# Patient Record
Sex: Male | Born: 1945 | Race: White | Hispanic: No | Marital: Single | Smoking: Never smoker
Health system: Southern US, Community
[De-identification: ages and names within clinical notes are randomized; demographics above are authoritative.]

## PROBLEM LIST (undated history)

## (undated) DIAGNOSIS — N182 Chronic kidney disease, stage 2 (mild): Secondary | ICD-10-CM

## (undated) DIAGNOSIS — G20C Parkinsonism, unspecified: Secondary | ICD-10-CM

## (undated) DIAGNOSIS — D61818 Other pancytopenia: Secondary | ICD-10-CM

## (undated) DIAGNOSIS — E039 Hypothyroidism, unspecified: Secondary | ICD-10-CM

## (undated) DIAGNOSIS — Z86010 Personal history of colonic polyps: Secondary | ICD-10-CM

## (undated) DIAGNOSIS — R2689 Other abnormalities of gait and mobility: Secondary | ICD-10-CM

## (undated) DIAGNOSIS — I10 Essential (primary) hypertension: Secondary | ICD-10-CM

## (undated) DIAGNOSIS — M1990 Unspecified osteoarthritis, unspecified site: Secondary | ICD-10-CM

## (undated) DIAGNOSIS — Z87442 Personal history of urinary calculi: Secondary | ICD-10-CM

## (undated) DIAGNOSIS — D649 Anemia, unspecified: Secondary | ICD-10-CM

## (undated) DIAGNOSIS — Z973 Presence of spectacles and contact lenses: Secondary | ICD-10-CM

## (undated) DIAGNOSIS — Z860101 Personal history of adenomatous and serrated colon polyps: Secondary | ICD-10-CM

## (undated) DIAGNOSIS — E785 Hyperlipidemia, unspecified: Secondary | ICD-10-CM

## (undated) DIAGNOSIS — Z8673 Personal history of transient ischemic attack (TIA), and cerebral infarction without residual deficits: Secondary | ICD-10-CM

## (undated) DIAGNOSIS — H548 Legal blindness, as defined in USA: Secondary | ICD-10-CM

## (undated) DIAGNOSIS — Q159 Congenital malformation of eye, unspecified: Secondary | ICD-10-CM

## (undated) DIAGNOSIS — R161 Splenomegaly, not elsewhere classified: Secondary | ICD-10-CM

## (undated) DIAGNOSIS — D4959 Neoplasm of unspecified behavior of other genitourinary organ: Secondary | ICD-10-CM

## (undated) DIAGNOSIS — Z972 Presence of dental prosthetic device (complete) (partial): Secondary | ICD-10-CM

## (undated) DIAGNOSIS — G20 Parkinson's disease: Secondary | ICD-10-CM

## (undated) DIAGNOSIS — Z974 Presence of external hearing-aid: Secondary | ICD-10-CM

## (undated) HISTORY — PX: INGUINAL HERNIA REPAIR: SUR1180

## (undated) HISTORY — PX: COLONOSCOPY: SHX174

## (undated) HISTORY — DX: Hyperlipidemia, unspecified: E78.5

---

## 1985-12-04 HISTORY — PX: CYSTOSCOPY/RETROGRADE/URETEROSCOPY/STONE EXTRACTION WITH BASKET: SHX5317

## 1999-02-02 ENCOUNTER — Ambulatory Visit (HOSPITAL_COMMUNITY): Admission: RE | Admit: 1999-02-02 | Discharge: 1999-02-02 | Payer: Self-pay

## 2001-10-06 ENCOUNTER — Emergency Department (HOSPITAL_COMMUNITY): Admission: EM | Admit: 2001-10-06 | Discharge: 2001-10-06 | Payer: Self-pay | Admitting: Emergency Medicine

## 2001-12-30 ENCOUNTER — Encounter (INDEPENDENT_AMBULATORY_CARE_PROVIDER_SITE_OTHER): Payer: Self-pay | Admitting: Specialist

## 2001-12-30 ENCOUNTER — Ambulatory Visit (HOSPITAL_COMMUNITY): Admission: RE | Admit: 2001-12-30 | Discharge: 2001-12-30 | Payer: Self-pay | Admitting: *Deleted

## 2002-01-09 ENCOUNTER — Ambulatory Visit (HOSPITAL_COMMUNITY): Admission: RE | Admit: 2002-01-09 | Discharge: 2002-01-09 | Payer: Self-pay | Admitting: Oncology

## 2002-01-09 ENCOUNTER — Encounter (HOSPITAL_COMMUNITY): Payer: Self-pay | Admitting: Oncology

## 2005-03-04 ENCOUNTER — Emergency Department (HOSPITAL_COMMUNITY): Admission: EM | Admit: 2005-03-04 | Discharge: 2005-03-04 | Payer: Self-pay | Admitting: Family Medicine

## 2005-10-30 ENCOUNTER — Encounter: Admission: RE | Admit: 2005-10-30 | Discharge: 2005-10-30 | Payer: Self-pay | Admitting: Obstetrics and Gynecology

## 2005-11-06 ENCOUNTER — Ambulatory Visit (HOSPITAL_COMMUNITY): Admission: RE | Admit: 2005-11-06 | Discharge: 2005-11-06 | Payer: Self-pay | Admitting: Surgery

## 2005-11-06 ENCOUNTER — Ambulatory Visit (HOSPITAL_BASED_OUTPATIENT_CLINIC_OR_DEPARTMENT_OTHER): Admission: RE | Admit: 2005-11-06 | Discharge: 2005-11-06 | Payer: Self-pay | Admitting: Surgery

## 2005-11-06 ENCOUNTER — Encounter (INDEPENDENT_AMBULATORY_CARE_PROVIDER_SITE_OTHER): Payer: Self-pay | Admitting: *Deleted

## 2005-12-06 ENCOUNTER — Emergency Department (HOSPITAL_COMMUNITY): Admission: EM | Admit: 2005-12-06 | Discharge: 2005-12-07 | Payer: Self-pay | Admitting: Emergency Medicine

## 2007-07-01 ENCOUNTER — Ambulatory Visit: Payer: Self-pay | Admitting: Oncology

## 2007-08-07 LAB — COMPREHENSIVE METABOLIC PANEL
ALT: 18 U/L (ref 0–53)
AST: 27 U/L (ref 0–37)
Albumin: 4.9 g/dL (ref 3.5–5.2)
Alkaline Phosphatase: 42 U/L (ref 39–117)
Calcium: 9.2 mg/dL (ref 8.4–10.5)
Chloride: 105 mEq/L (ref 96–112)
Potassium: 3.8 mEq/L (ref 3.5–5.3)
Sodium: 138 mEq/L (ref 135–145)
Total Protein: 7 g/dL (ref 6.0–8.3)

## 2007-08-07 LAB — CBC & DIFF AND RETIC
BASO%: 0.4 % (ref 0.0–2.0)
LYMPH%: 28.8 % (ref 14.0–48.0)
MCHC: 35.8 g/dL (ref 32.0–35.9)
MCV: 86.3 fL (ref 81.6–98.0)
MONO#: 0.5 10*3/uL (ref 0.1–0.9)
MONO%: 15.6 % — ABNORMAL HIGH (ref 0.0–13.0)
NEUT#: 1.6 10*3/uL (ref 1.5–6.5)
Platelets: 110 10*3/uL — ABNORMAL LOW (ref 145–400)
RBC: 3.93 10*6/uL — ABNORMAL LOW (ref 4.20–5.71)
RDW: 13.8 % (ref 11.2–14.6)
RETIC #: 90.4 10*3/uL (ref 31.8–103.9)
Retic %: 2.3 % (ref 0.7–2.3)
WBC: 3 10*3/uL — ABNORMAL LOW (ref 4.0–10.0)

## 2007-08-07 LAB — CHCC SMEAR

## 2007-08-07 LAB — MORPHOLOGY: PLT EST: DECREASED

## 2007-10-30 ENCOUNTER — Ambulatory Visit: Payer: Self-pay | Admitting: Oncology

## 2007-11-05 LAB — MORPHOLOGY

## 2007-11-05 LAB — CBC WITH DIFFERENTIAL/PLATELET
EOS%: 3.2 % (ref 0.0–7.0)
Eosinophils Absolute: 0.1 10*3/uL (ref 0.0–0.5)
LYMPH%: 25.3 % (ref 14.0–48.0)
MCH: 31 pg (ref 28.0–33.4)
MCHC: 35.8 g/dL (ref 32.0–35.9)
MCV: 86.6 fL (ref 81.6–98.0)
MONO%: 19.8 % — ABNORMAL HIGH (ref 0.0–13.0)
Platelets: 99 10*3/uL — ABNORMAL LOW (ref 145–400)
RBC: 3.96 10*6/uL — ABNORMAL LOW (ref 4.20–5.71)

## 2007-11-05 LAB — RETICULOCYTES
IRF: 0.43 — ABNORMAL HIGH (ref 0.070–0.380)
RETIC #: 66.3 10*3/uL (ref 31.8–103.9)

## 2007-11-06 LAB — COMPREHENSIVE METABOLIC PANEL
ALT: 17 U/L (ref 0–53)
AST: 31 U/L (ref 0–37)
Calcium: 10 mg/dL (ref 8.4–10.5)
Chloride: 105 mEq/L (ref 96–112)
Creatinine, Ser: 1.6 mg/dL — ABNORMAL HIGH (ref 0.40–1.50)
Sodium: 141 mEq/L (ref 135–145)
Total Bilirubin: 0.5 mg/dL (ref 0.3–1.2)

## 2008-01-10 ENCOUNTER — Ambulatory Visit (HOSPITAL_COMMUNITY): Admission: RE | Admit: 2008-01-10 | Discharge: 2008-01-10 | Payer: Self-pay | Admitting: Internal Medicine

## 2008-03-25 ENCOUNTER — Encounter: Admission: RE | Admit: 2008-03-25 | Discharge: 2008-04-13 | Payer: Self-pay | Admitting: Orthopedic Surgery

## 2008-04-02 ENCOUNTER — Ambulatory Visit (HOSPITAL_COMMUNITY): Admission: RE | Admit: 2008-04-02 | Discharge: 2008-04-02 | Payer: Self-pay | Admitting: Orthopedic Surgery

## 2008-05-06 ENCOUNTER — Ambulatory Visit: Payer: Self-pay | Admitting: Oncology

## 2008-05-08 LAB — COMPREHENSIVE METABOLIC PANEL
Albumin: 4.9 g/dL (ref 3.5–5.2)
Alkaline Phosphatase: 39 U/L (ref 39–117)
BUN: 22 mg/dL (ref 6–23)
Calcium: 9.6 mg/dL (ref 8.4–10.5)
Chloride: 103 mEq/L (ref 96–112)
Glucose, Bld: 93 mg/dL (ref 70–99)
Potassium: 4.6 mEq/L (ref 3.5–5.3)
Sodium: 137 mEq/L (ref 135–145)
Total Protein: 7.1 g/dL (ref 6.0–8.3)

## 2008-05-08 LAB — CBC WITH DIFFERENTIAL/PLATELET
Basophils Absolute: 0 10*3/uL (ref 0.0–0.1)
Eosinophils Absolute: 0.1 10*3/uL (ref 0.0–0.5)
HGB: 11.9 g/dL — ABNORMAL LOW (ref 13.0–17.1)
MONO#: 0.5 10*3/uL (ref 0.1–0.9)
MONO%: 15.6 % — ABNORMAL HIGH (ref 0.0–13.0)
NEUT#: 1.5 10*3/uL (ref 1.5–6.5)
RBC: 3.92 10*6/uL — ABNORMAL LOW (ref 4.20–5.71)
RDW: 13.2 % (ref 11.2–14.6)
WBC: 2.9 10*3/uL — ABNORMAL LOW (ref 4.0–10.0)
lymph#: 0.9 10*3/uL (ref 0.9–3.3)

## 2008-06-04 ENCOUNTER — Ambulatory Visit (HOSPITAL_COMMUNITY): Admission: RE | Admit: 2008-06-04 | Discharge: 2008-06-05 | Payer: Self-pay | Admitting: Neurosurgery

## 2008-06-04 HISTORY — PX: ANTERIOR CERVICAL DECOMP/DISCECTOMY FUSION: SHX1161

## 2008-11-04 ENCOUNTER — Ambulatory Visit: Payer: Self-pay | Admitting: Oncology

## 2008-11-06 LAB — CBC WITH DIFFERENTIAL/PLATELET
Basophils Absolute: 0 10*3/uL (ref 0.0–0.1)
EOS%: 3.3 % (ref 0.0–7.0)
Eosinophils Absolute: 0.1 10*3/uL (ref 0.0–0.5)
HCT: 34.6 % — ABNORMAL LOW (ref 38.7–49.9)
HGB: 12.2 g/dL — ABNORMAL LOW (ref 13.0–17.1)
MCH: 30.8 pg (ref 28.0–33.4)
MCV: 87.3 fL (ref 81.6–98.0)
NEUT#: 1.9 10*3/uL (ref 1.5–6.5)
NEUT%: 51.1 % (ref 40.0–75.0)
lymph#: 1.1 10*3/uL (ref 0.9–3.3)

## 2008-11-06 LAB — COMPREHENSIVE METABOLIC PANEL
AST: 25 U/L (ref 0–37)
Albumin: 4.8 g/dL (ref 3.5–5.2)
BUN: 18 mg/dL (ref 6–23)
Calcium: 9.4 mg/dL (ref 8.4–10.5)
Chloride: 106 mEq/L (ref 96–112)
Creatinine, Ser: 1.55 mg/dL — ABNORMAL HIGH (ref 0.40–1.50)
Glucose, Bld: 102 mg/dL — ABNORMAL HIGH (ref 70–99)
Potassium: 4.5 mEq/L (ref 3.5–5.3)

## 2008-11-06 LAB — LACTATE DEHYDROGENASE: LDH: 346 U/L — ABNORMAL HIGH (ref 94–250)

## 2009-11-03 ENCOUNTER — Ambulatory Visit: Payer: Self-pay | Admitting: Oncology

## 2009-11-05 LAB — CBC WITH DIFFERENTIAL/PLATELET
BASO%: 0.3 % (ref 0.0–2.0)
EOS%: 2.9 % (ref 0.0–7.0)
HCT: 37.3 % — ABNORMAL LOW (ref 38.4–49.9)
LYMPH%: 28.4 % (ref 14.0–49.0)
MCH: 29.5 pg (ref 27.2–33.4)
MCHC: 34 g/dL (ref 32.0–36.0)
MCV: 86.7 fL (ref 79.3–98.0)
NEUT%: 51.7 % (ref 39.0–75.0)
Platelets: 91 10*3/uL — ABNORMAL LOW (ref 140–400)

## 2009-11-05 LAB — COMPREHENSIVE METABOLIC PANEL
ALT: 23 U/L (ref 0–53)
AST: 34 U/L (ref 0–37)
BUN: 22 mg/dL (ref 6–23)
Creatinine, Ser: 2 mg/dL — ABNORMAL HIGH (ref 0.40–1.50)
Total Bilirubin: 0.5 mg/dL (ref 0.3–1.2)

## 2009-11-05 LAB — LACTATE DEHYDROGENASE: LDH: 370 U/L — ABNORMAL HIGH (ref 94–250)

## 2010-09-23 ENCOUNTER — Encounter: Admission: RE | Admit: 2010-09-23 | Discharge: 2010-10-20 | Payer: Self-pay | Admitting: Orthopedic Surgery

## 2010-11-02 ENCOUNTER — Ambulatory Visit: Payer: Self-pay | Admitting: Oncology

## 2010-11-04 LAB — COMPREHENSIVE METABOLIC PANEL
ALT: 27 U/L (ref 0–53)
AST: 42 U/L — ABNORMAL HIGH (ref 0–37)
Alkaline Phosphatase: 42 U/L (ref 39–117)
CO2: 25 mEq/L (ref 19–32)
Sodium: 139 mEq/L (ref 135–145)
Total Bilirubin: 0.4 mg/dL (ref 0.3–1.2)
Total Protein: 6.6 g/dL (ref 6.0–8.3)

## 2010-11-04 LAB — CBC WITH DIFFERENTIAL/PLATELET
BASO%: 0.1 % (ref 0.0–2.0)
LYMPH%: 20.4 % (ref 14.0–49.0)
MCHC: 35.1 g/dL (ref 32.0–36.0)
MONO#: 0.6 10*3/uL (ref 0.1–0.9)
MONO%: 15.1 % — ABNORMAL HIGH (ref 0.0–14.0)
Platelets: 107 10*3/uL — ABNORMAL LOW (ref 140–400)
RBC: 3.88 10*6/uL — ABNORMAL LOW (ref 4.20–5.82)
WBC: 3.8 10*3/uL — ABNORMAL LOW (ref 4.0–10.3)

## 2011-04-18 NOTE — Op Note (Signed)
NAME:  Kyle Espinoza, Kyle Espinoza NO.:  [id number]   MEDICAL RECORD NO.:  [id number]          PATIENT TYPE:  OIB   LOCATION:  3528                         FACILITY:  [facility]   PHYSICIAN:  Danae Orleans. Venetia Maxon, M.D.  DATE OF BIRTH:  07-Sep-1946   DATE OF PROCEDURE:  06/04/2008  DATE OF DISCHARGE:                               OPERATIVE REPORT   PREOPERATIVE DIAGNOSES:  1. 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  2. Degenerative disk disease.  3. Radiculopathy C4-5, C5-6 and C6-7 levels.   POSTOPERATIVE DIAGNOSES:  1. 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  2. Degenerative disk disease.  3. Radiculopathy C4-5, C5-6 and C6-7 levels.   PROCEDURE:  Anterior cervical decompression and fusion C4-5, C5-6 and C6-  7 levels with allograft bone wedges, autograft 4 troughs and anterior  cervical plate.   SURGEON:  Danae Orleans. Venetia Maxon, MD   ASSISTANT:  Coletta Memos, MD  Proteet, RN   ANESTHESIA:  General endotracheal anesthesia.   BLOOD LOSS:  Less than 100 mL.   COMPLICATIONS:  None.   DISPOSITION:  Recovery.   INDICATIONS:  Kyle Espinoza is a 65 year old man with significant right  upper extremity pain and weakness and multilevel cervical spondylosis.  It was elected to take him to surgery for anterior cervical  decompression and fusion at C4-5, C5-6 and C6-7 levels.   PROCEDURE:  Mr. Kinnear was brought to the operating room.  Following  satisfactory and uncomplicated induction of general endotracheal  anesthesia and placing intravenous lines, the patient was placed in a  supine position on the operating table.  His neck was placed in slight  extension.  He was placed in 5 pounds Holter traction.  His anterior  neck was then prepped and draped in the usual sterile fashion. Planned  incision was marked in the left side of the neck along the lower neck  creases infiltrated with 0.25% Marcaine and .5% lidocaine with 1:200,000  epinephrine and then incised  through the platysma layer.  Subplatysmal  dissection was performed exposing the anterior border of the  sternocleidomastoid muscle using blunt dissection and the carotid sheath  was kept lateral, trachea in the cephalad, and the esophagus kept medial  exposing the anterior cervical spine.  A bent spinal needle was placed.  It was felt to be the C4-5, C5-6 level and this was confirmed on  intraoperative x-ray.  Subsequently, longus colli muscles were taken  down from the anterior cervical spine from C4-C7 using electrocautery  and Key elevator.  Guidice-retaining shadow line retractor was placed along  with up and down retractors.  Large ventral osteophytes were removed  with [place] and [employer].  The interspaces of the C4-5, C5-6 and  C6-7 were then incised with 15-blade and disk materials were removed in  piecemeal fashion.  Endplates were stripped of residual disk material.  Initially distraction pins were placed at C6 and C7 and microscope was  brought into field.  Using high-power microscopic visualization, the  endplates were decorticated with high-speed drill and uncinate spurs  were drilled down.  Bone autograft  was retained for later use of bone  grafting.  The posterior longitudinal ligament was incised and removed  in piecemeal fashion and the spinal cord dura was decompressed as were  both C7 nerve roots, particularly on the right and left nerve root, C7  nerve root was widely decompressed as this is a symptomatic site.  Hemostasis was assured with Gelfoam soaked thrombin.  After trial sizing  a 7-mm allograft with bone wedge in selected fashion with high-speed  drill, packed with morcellized bone autograft and      reconstituted  with the patient's blood, inserted in the interspace and countersunk  appropriately.  Attention was then turned to the C4-5 level where  similar decompression was performed.  Both T5 nerve roots were widely  decompressed as was the central spinal  cord dura. A 6-mm allograft bone  wedge was selected and fashioned with a high-speed drill, pack with  morselized bone autograft and inserted in the interspace and [employer]  inserted in the interspace and countersunk appropriately.  At C5-6  similar decompression was performed.  The uncinate spurs were drilled  down.  Spinal cord dura and both C6 nerve roots were widely  decompressed.  Hemostasis was assured and after trial sizing a 6-mm,  bone allograft was fashioned with high-speed drill, packed with  morselized autograft which was inserted in the interspace and  countersunk appropriately.  A 48-mm sessile anterior cervical plate was  then affixed to the anterior cervical spine using variable angle 40-mm  screws, 2 at C4, 2 at C5, 2 at C6, and 2 at C7.  All screws had  excellent purchase.  Locking mechanisms were engaged.  The traction  weight was removed prior to placing the plate and screws.  Hemostasis  assured.  The soft tissues were checked and found to be in good repair.  Wound was copiously irrigated with bacitracin saline.  Platysma layer  was then closed with 3-0 Vicryl sutures and skin edges reapproximated  with 3-0 Vicryl subcuticular stitch.  Final x-ray obtained prior to  closing demonstrated well-positioned interbody graft at C4-5 with screws  visualized at C4-C5, and the upper aspect of C6 was also visualized.  The wound was dressed with Dermabond.  The patient was extubated in the  operating room, and taken to recovery in stable, satisfactory condition  having tolerated this operation well.  Counts were correct at the end of  the case.      Danae Orleans. Venetia Maxon, M.D.  Electronically Signed     JDS/MEDQ  D:  06/04/2008  T:  06/05/2008  Job:  [id number]

## 2011-04-21 NOTE — Procedures (Signed)
[facility]  Patient:    Kyle Espinoza, Kyle Espinoza Visit Number: [id number] MRN: [id number]          Service Type: END Location: ENDO Attending Physician:  Sabino Gasser Dictated by:   Sabino Gasser, M.D. Admit Date:  12/30/2001                             Procedure Report  PROCEDURE:  Colonoscopy.  SURGEON:  Sabino Gasser, M.D.  INDICATIONS:  Rectal bleeding.  ANESTHESIA:  Demerol 90 and Versed 9 mg.  DESCRIPTION OF PROCEDURE:  With the patient mildly sedated in the left lateral decubitus position, a rectal exam was performed which was unremarkable. Subsequently, the Olympus videoscopic colonoscope was inserted into the rectum and passed under direct vision to the cecum, identified by the ileocecal valve and appendiceal orifice, both of which were photographed.  From this point, the colonoscope was slowly withdrawn, taking circumferential views of the entire colonic mucosa, stopping in the ascending colon, just above the ileocecal valve where a small polyp was seen, photographed, and removed using snare cautery technique, setting 20/20 blunted current.  Tissue was retrieved for pathology.  The endoscope was then withdrawn all the way to the rectum which appeared normal on direct and showed small hemorrhoids on retroflexed view.  The endoscope was straightened and withdrawn.  The patients vital signs and pulse oximeter remained stable.  The patient tolerated the procedure well and without apparent complications.  FINDINGS:  Small polyp of the cecal area just above the ileocecal valve.  PLAN:  Await biopsy report.  The patient will call me for results and follow up with me as an outpatient as indicated. Dictated by:   Sabino Gasser, M.D. Attending Physician:  Sabino Gasser DD:  12/30/01 TD:  12/30/01 Job: [id number]

## 2011-08-31 LAB — CBC
HCT: 34.7 — ABNORMAL LOW
MCV: 88.7
RBC: 3.91 — ABNORMAL LOW
WBC: 4.4

## 2012-01-07 ENCOUNTER — Emergency Department (HOSPITAL_COMMUNITY): Payer: Medicare Other

## 2012-01-07 ENCOUNTER — Encounter (HOSPITAL_COMMUNITY): Payer: Self-pay | Admitting: *Deleted

## 2012-01-07 ENCOUNTER — Inpatient Hospital Stay (HOSPITAL_COMMUNITY)
Admission: EM | Admit: 2012-01-07 | Discharge: 2012-01-09 | DRG: 069 | Disposition: A | Discharge status: Home / Self Care | Payer: Medicare Other | Attending: Internal Medicine | Admitting: Internal Medicine

## 2012-01-07 DIAGNOSIS — Z7982 Long term (current) use of aspirin: Secondary | ICD-10-CM

## 2012-01-07 DIAGNOSIS — E785 Hyperlipidemia, unspecified: Secondary | ICD-10-CM | POA: Diagnosis present

## 2012-01-07 DIAGNOSIS — I10 Essential (primary) hypertension: Secondary | ICD-10-CM | POA: Diagnosis present

## 2012-01-07 DIAGNOSIS — T50905A Adverse effect of unspecified drugs, medicaments and biological substances, initial encounter: Secondary | ICD-10-CM

## 2012-01-07 DIAGNOSIS — G459 Transient cerebral ischemic attack, unspecified: Principal | ICD-10-CM | POA: Diagnosis present

## 2012-01-07 DIAGNOSIS — R531 Weakness: Secondary | ICD-10-CM

## 2012-01-07 DIAGNOSIS — E039 Hypothyroidism, unspecified: Secondary | ICD-10-CM | POA: Diagnosis present

## 2012-01-07 DIAGNOSIS — Z888 Allergy status to other drugs, medicaments and biological substances status: Secondary | ICD-10-CM

## 2012-01-07 DIAGNOSIS — R4781 Slurred speech: Secondary | ICD-10-CM

## 2012-01-07 HISTORY — DX: Essential (primary) hypertension: I10

## 2012-01-07 LAB — URINALYSIS, MICROSCOPIC ONLY
Bilirubin Urine: NEGATIVE
Glucose, UA: NEGATIVE mg/dL
Ketones, ur: NEGATIVE mg/dL
Leukocytes, UA: NEGATIVE
pH: 7 (ref 5.0–8.0)

## 2012-01-07 LAB — CBC
Hemoglobin: 12 g/dL — ABNORMAL LOW (ref 13.0–17.0)
MCH: 28.8 pg (ref 26.0–34.0)
MCV: 84.4 fL (ref 78.0–100.0)
RBC: 4.16 MIL/uL — ABNORMAL LOW (ref 4.22–5.81)
WBC: 4.1 10*3/uL (ref 4.0–10.5)

## 2012-01-07 LAB — BASIC METABOLIC PANEL
BUN: 14 mg/dL (ref 6–23)
CO2: 20 mEq/L (ref 19–32)
GFR calc non Af Amer: 69 mL/min — ABNORMAL LOW (ref >90)
Glucose, Bld: 135 mg/dL — ABNORMAL HIGH (ref 70–99)
Potassium: 3.8 mEq/L (ref 3.5–5.1)

## 2012-01-07 LAB — DIFFERENTIAL
Eosinophils Absolute: 0 10*3/uL (ref 0.0–0.7)
Lymphocytes Relative: 12 % (ref 12–46)
Lymphs Abs: 0.5 10*3/uL — ABNORMAL LOW (ref 0.7–4.0)
Monocytes Relative: 11 % (ref 3–12)
Neutrophils Relative %: 77 % (ref 43–77)

## 2012-01-07 NOTE — ED Notes (Signed)
From urgent care; wife states, "not acting normal." last seen normal was last evening." weak gait and slower speech. Motor 5/5. Disoriented. But oriented now upon arrival. Vss. bp 160/80, 108; cbj 130. 2l [place] 99.

## 2012-01-07 NOTE — ED Notes (Signed)
Pt. In room resting quietly, no complaints of pain, pt. Still oriented to person and place, gave pt. And family beverage and crackers.

## 2012-01-07 NOTE — ED Notes (Signed)
Heart Healthy Diet requested from service response center

## 2012-01-07 NOTE — ED Notes (Signed)
Inserted in and out. Pt tolerated well. Yellow urine returned

## 2012-01-07 NOTE — ED Notes (Signed)
Pt started taking ceftin for sinus infection on Monday and per wife, pt woke up confused this am. Pt remains confused but recognizes wife and family.

## 2012-01-07 NOTE — ED Notes (Signed)
Family at bedside. 

## 2012-01-07 NOTE — ED Provider Notes (Signed)
History     CSN: [id number]  Arrival date & time 01/07/12  0940   First MD Initiated Contact with Patient 01/07/12 1023      Chief Complaint  Patient presents with  . Weakness    (Consider location/radiation/quality/duration/timing/severity/associated sxs/prior treatment) Patient is a 66 y.o. male presenting with altered mental status. The history is provided by the spouse.  Altered Mental Status This is a new problem. The current episode started yesterday. The problem occurs constantly. The problem has been gradually worsening. Pertinent negatives include no abdominal pain, chills, congestion, coughing, diaphoresis, fatigue, fever, headaches, numbness or weakness. The symptoms are aggravated by nothing. He has tried nothing for the symptoms.   Per spouse patient has been "not acting normally" this morning. She began to notice that he seemed to be walking with a shuffling gait last evening but was behaving normally. This morning she noticed that he seemed disoriented and was speaking slowly; he called his wife by his mother's name several times and "didn't seem to be making sense." He does not have a hx of CVA/TIA/heart disease, and has never been a smoker. He has HTN which is well controlled.  Pt was recently txed by his PCP for possible sinus infection and was started on Ceftin on Monday. He has been taking this as prescribed. No known fever.  Pt works at [employer] in [employer]. He was out of work on Monday through Wednesday - when he went back Thursday, his supervisor noted that he seemed to be behaving slightly differently than normal.  Past Medical History  Diagnosis Date  . Hypertension     No past surgical history on file.  History reviewed. No pertinent family history.  History  Substance Use Topics  . Smoking status: Not on file  . Smokeless tobacco: Not on file  . Alcohol Use: No      Review of Systems  Unable to perform ROS: Mental status change  Constitutional:  Negative for fever, chills, diaphoresis and fatigue.  HENT: Negative for congestion.   Respiratory: Negative for cough.   Gastrointestinal: Negative for abdominal pain.  Neurological: Negative for weakness, numbness and headaches.  Psychiatric/Behavioral: Positive for altered mental status.    Allergies  Azithromycin  Home Medications   Current Outpatient Rx  Name Route Sig Dispense Refill  . AMLODIPINE BESYLATE 10 MG PO TABS Oral Take 10 mg by mouth daily.    . ASPIRIN EC 81 MG PO TBEC Oral Take 81 mg by mouth daily.    Marland Kitchen CEFUROXIME AXETIL 500 MG PO TABS Oral Take 500 mg by mouth 2 (two) times daily. 10 day dose, filled 01-01-12, 8 tablets remaining in bottle    . CETIRIZINE HCL 10 MG PO TABS Oral Take 10 mg by mouth daily.    . CYCLOBENZAPRINE HCL 10 MG PO TABS Oral Take 10 mg by mouth 3 (three) times daily as needed. For muscle spasms    . FENOFIBRATE 145 MG PO TABS Oral Take 145 mg by mouth daily.    Marland Kitchen HYDROCODONE-ACETAMINOPHEN 5-500 MG PO TABS Oral Take 1-2 tablets by mouth every 4 (four) hours as needed. For pain    . LEVOTHYROXINE SODIUM 112 MCG PO TABS Oral Take 112 mcg by mouth daily.    Marland Kitchen FISH OIL 1000 MG PO CAPS Oral Take 3,000 mg by mouth 2 (two) times daily.    Marland Kitchen PROMETHAZINE HCL 25 MG PO TABS Oral Take 25 mg by mouth every 4 (four) hours as needed. For nausea  BP 164/73  Pulse 109  Temp(Src) 98.1 F (36.7 C) (Oral)  Resp 26  SpO2 100%  Physical Exam  Nursing note and vitals reviewed. Constitutional: He is oriented to person, place, and time. He appears well-developed and well-nourished. He appears listless. No distress.       Non toxic appearing  HENT:  Head: Normocephalic and atraumatic.  Right Ear: External ear normal.  Left Ear: External ear normal.  Mouth/Throat: Oropharynx is clear and moist. No oropharyngeal exudate.  Eyes: Conjunctivae and EOM are normal. Pupils are equal, round, and reactive to light. Right eye exhibits no discharge. Left eye  exhibits no discharge.  Neck: Normal range of motion. Neck supple.  Cardiovascular: Normal rate, regular rhythm and normal heart sounds.   Pulmonary/Chest: Effort normal and breath sounds normal. He exhibits no tenderness.  Abdominal: Soft. Bowel sounds are normal. There is no tenderness.  Musculoskeletal: Normal range of motion. He exhibits no edema and no tenderness.  Lymphadenopathy:    He has no cervical adenopathy.  Neurological: He is oriented to person, place, and time. He has normal strength. He appears listless. No cranial nerve deficit. Coordination normal. GCS eye subscore is 4. GCS verbal subscore is 5. GCS motor subscore is 6.       Oriented to person, place, year but not day of the week. Able to name president. Can name items (watch, pen). Listless appearing, verbal responses sometimes delayed.  Skin: Skin is warm and dry. No rash noted. He is not diaphoretic.  Psychiatric: He has a normal mood and affect. His speech is delayed. He is slowed.    ED Course  Procedures (including critical care time)  Labs Reviewed  URINALYSIS, WITH MICROSCOPIC - Abnormal; Notable for the following:    Hgb urine dipstick SMALL (*)    All other components within normal limits  CBC - Abnormal; Notable for the following:    RBC 4.16 (*)    Hemoglobin 12.0 (*)    HCT 35.1 (*)    Platelets 91 (*) PLATELET COUNT CONFIRMED BY SMEAR   All other components within normal limits  DIFFERENTIAL - Abnormal; Notable for the following:    Lymphs Abs 0.5 (*)    All other components within normal limits  BASIC METABOLIC PANEL - Abnormal; Notable for the following:    Glucose, Bld 135 (*)    GFR calc non Af Amer 69 (*)    GFR calc Af Amer 80 (*)    All other components within normal limits  TSH   Dg Chest 2 View  01/07/2012  *RADIOLOGY REPORT*  Clinical Data: Weakness, altered mental status  CHEST - 2 VIEW  Comparison: 01/10/2008, 10/30/2005  Findings: Decreased lung volumes with basilar atelectasis.   Normal heart size and slight vascular prominence related to poor inspiration.  No definite focal pneumonia, edema, effusion or pneumothorax.  Degenerative changes of the spine.  IMPRESSION: Low volume exam with atelectasis  Original Report Authenticated By: Judie Petit. Ruel Favors, M.D.   Ct Head Wo Contrast  01/07/2012  *RADIOLOGY REPORT*  Clinical Data: Weakness, fatigue, slurred speech  CT HEAD WITHOUT CONTRAST  Technique:  Contiguous axial images were obtained from the base of the skull through the vertex without contrast.  Comparison: 12/06/2005  Findings: No acute intracranial hemorrhage, mass lesion, definite infarction, or focal edema.  No hydrocephalus, herniation, midline shift, or extra-axial fluid collection.  Gray-white matter differentiation is maintained.  Cisterns patent.  No cerebellar abnormality.  Symmetric orbits.  Mastoids and sinuses remain  clear.  IMPRESSION: Stable exam.  No acute intracranial finding.  Original Report Authenticated By: Judie Petit. Ruel Favors, M.D.  I personally reviewed the pt's imaging.   No diagnosis found.    MDM  10:45 AM Pt with no known hx CVA presents with weakness, disorientation, slow speech. Last known normal time was yesterday afternoon. Will send for stat CT head. CXR, UA, labs ordered to r/o infx. Will continue to monitor.  CT head negative. Labs do not show a compelling reason for his symptoms or impetus for admission. Question whether this may be a medication reaction. Discussed with Dr. Weldon Inches. Pt denies recently having taken his Vicodin or Flexeril which could potentially be sedating. However, he was recently started on Ceftin on Monday for possible sinus infection. A literature search shows that there is the possibility of cephalosporin neuropsychiatric toxicity which can cause delirium or similar symptoms. Additionally, he is taking levothyroxine and was recently changed to a different generic than his usual which may have potentially caused his TSH to  be nontherapeutic. Plan to observe patient in CDU and perform serial checks to evaluate for changes in mental status or symptoms. Case discussed with Dr. Anitra Lauth who will monitor the patient.  Patient was rechecked at 4 pm and was able to answer questions, with some tangential remarks noted. Wife at bedside states that he seems to be behaving more normally but is not completely at baseline.  Grant Fontana, [place] 01/07/12 1723

## 2012-01-07 NOTE — ED Notes (Signed)
Pt. In room sleeping, family at bedside.

## 2012-01-07 NOTE — ED Notes (Signed)
Assisted the pt in moving up in the bed to eat his dinner.

## 2012-01-07 NOTE — ED Notes (Signed)
According to wife, "pt. Been running a fever over last couple days." no productive cough, sob, diarrhea, vomiting.

## 2012-01-07 NOTE — ED Provider Notes (Signed)
Medical screening examination/treatment/procedure(s) were conducted as a shared visit with non-physician practitioner(s) and myself.  I personally evaluated the patient during the encounter Pt still able to work. 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  For 4 d has had increased slurred speech, shuffling gate, weakness.  He denies pain anywhere.  He is on abx for resp infection.  PCP is Radio producer.  pe alert, oriented X4.  No neuro deficits. Listless, sluggish.  No physical findings of severe illness or toxicity.  Does take cyclobenzaprine and vicodin.    May be iatrogenic.  Will put in cdu for monitoring.    [name], MD 01/07/12 [phone]

## 2012-01-07 NOTE — ED Provider Notes (Signed)
I saw and evaluated the patient. He is resting comfortably but still has some mild slurred speech. It sounds like he started an antibiotic this week which was Ceftin for a sinus infection and since Monday starting the medication he is just progressively gotten worse. He also states that he has had problems with Vicodin in the past and was only taking occasionally but then started taking it daily in the that may have caused some of his symptoms as well. Patient will be reevaluated in the morning after not having any medication for 24 hours other than his blood pressure medication to see if his symptoms have improved.  Gwyneth Sprout, MD 01/07/12 2148

## 2012-01-08 ENCOUNTER — Encounter (HOSPITAL_COMMUNITY): Payer: Self-pay | Admitting: Internal Medicine

## 2012-01-08 ENCOUNTER — Inpatient Hospital Stay (HOSPITAL_COMMUNITY): Payer: Medicare Other

## 2012-01-08 DIAGNOSIS — R4781 Slurred speech: Secondary | ICD-10-CM | POA: Diagnosis present

## 2012-01-08 DIAGNOSIS — I10 Essential (primary) hypertension: Secondary | ICD-10-CM | POA: Diagnosis present

## 2012-01-08 LAB — CBC
HCT: 33.6 % — ABNORMAL LOW (ref 39.0–52.0)
Hemoglobin: 11.6 g/dL — ABNORMAL LOW (ref 13.0–17.0)
MCH: 29.4 pg (ref 26.0–34.0)
MCHC: 34.5 g/dL (ref 30.0–36.0)
MCV: 85.1 fL (ref 78.0–100.0)

## 2012-01-08 LAB — CARDIAC PANEL(CRET KIN+CKTOT+MB+TROPI)
CK, MB: 4.7 ng/mL — ABNORMAL HIGH (ref 0.3–4.0)
Relative Index: 1.8 (ref 0.0–2.5)
Relative Index: 2 (ref 0.0–2.5)
Total CK: 257 U/L — ABNORMAL HIGH (ref 7–232)
Total CK: 233 U/L — ABNORMAL HIGH (ref 7–232)
Total CK: 200 U/L (ref 7–232)

## 2012-01-08 LAB — RAPID URINE DRUG SCREEN, HOSP PERFORMED: Amphetamines: NOT DETECTED

## 2012-01-08 LAB — COMPREHENSIVE METABOLIC PANEL
ALT: 22 U/L (ref 0–53)
AST: 32 U/L (ref 0–37)
CO2: 20 mEq/L (ref 19–32)
Calcium: 9.1 mg/dL (ref 8.4–10.5)
GFR calc non Af Amer: 76 mL/min — ABNORMAL LOW (ref >90)
Sodium: 141 mEq/L (ref 135–145)

## 2012-01-08 LAB — LIPID PANEL
Cholesterol: 101 mg/dL (ref 0–200)
HDL: 21 mg/dL — ABNORMAL LOW (ref >39)
LDL Cholesterol: 46 mg/dL (ref 0–99)
Total CHOL/HDL Ratio: 4.8 RATIO
Triglycerides: 169 mg/dL — ABNORMAL HIGH (ref <150)
VLDL: 34 mg/dL (ref 0–40)

## 2012-01-08 LAB — HEMOGLOBIN A1C
Hgb A1c MFr Bld: 6 % — ABNORMAL HIGH (ref <5.7)
Mean Plasma Glucose: 126 mg/dL — ABNORMAL HIGH (ref <117)

## 2012-01-08 MED ORDER — ASPIRIN 300 MG RE SUPP
300.0000 mg | Freq: Every day | RECTAL | Status: DC
  Filled 2012-01-08 (×2): qty 1

## 2012-01-08 MED ORDER — OMEGA-3-ACID ETHYL ESTERS 1 G PO CAPS
1.0000 g | ORAL_CAPSULE | Freq: Every day | ORAL | Status: DC
  Administered 2012-01-08 – 2012-01-09 (×2): 1 g via ORAL
  Filled 2012-01-08 (×2): qty 1

## 2012-01-08 MED ORDER — SENNOSIDES-DOCUSATE SODIUM 8.6-50 MG PO TABS
1.0000 | ORAL_TABLET | Freq: Every evening | ORAL | Status: DC | PRN
  Filled 2012-01-08: qty 1

## 2012-01-08 MED ORDER — LEVOTHYROXINE SODIUM 112 MCG PO TABS
112.0000 ug | ORAL_TABLET | Freq: Every day | ORAL | Status: DC
  Administered 2012-01-08 – 2012-01-09 (×2): 112 ug via ORAL
  Filled 2012-01-08 (×3): qty 1

## 2012-01-08 MED ORDER — AMLODIPINE BESYLATE 10 MG PO TABS
10.0000 mg | ORAL_TABLET | Freq: Every day | ORAL | Status: DC
  Administered 2012-01-08 – 2012-01-09 (×2): 10 mg via ORAL
  Filled 2012-01-08 (×2): qty 1

## 2012-01-08 MED ORDER — STROKE: EARLY STAGES OF RECOVERY BOOK
Freq: Once | Status: AC
  Administered 2012-01-08: 06:00:00
  Filled 2012-01-08: qty 1

## 2012-01-08 MED ORDER — LORATADINE 10 MG PO TABS
10.0000 mg | ORAL_TABLET | Freq: Every day | ORAL | Status: DC
  Administered 2012-01-08 – 2012-01-09 (×2): 10 mg via ORAL
  Filled 2012-01-08 (×2): qty 1

## 2012-01-08 MED ORDER — ASPIRIN 325 MG PO TABS
325.0000 mg | ORAL_TABLET | Freq: Every day | ORAL | Status: DC
  Administered 2012-01-08 – 2012-01-09 (×2): 325 mg via ORAL
  Filled 2012-01-08 (×2): qty 1

## 2012-01-08 MED ORDER — SODIUM CHLORIDE 0.9 % IV SOLN
INTRAVENOUS | Status: DC
  Administered 2012-01-08 – 2012-01-09 (×3): via INTRAVENOUS

## 2012-01-08 MED ORDER — CYCLOBENZAPRINE HCL 10 MG PO TABS
10.0000 mg | ORAL_TABLET | Freq: Three times a day (TID) | ORAL | Status: DC | PRN
  Filled 2012-01-08: qty 1

## 2012-01-08 MED ORDER — POTASSIUM CHLORIDE CRYS ER 20 MEQ PO TBCR
40.0000 meq | EXTENDED_RELEASE_TABLET | Freq: Once | ORAL | Status: AC
  Administered 2012-01-08: 40 meq via ORAL
  Filled 2012-01-08: qty 2

## 2012-01-08 MED ORDER — FENOFIBRATE 160 MG PO TABS
160.0000 mg | ORAL_TABLET | Freq: Every day | ORAL | Status: DC
  Administered 2012-01-08 – 2012-01-09 (×2): 160 mg via ORAL
  Filled 2012-01-08 (×2): qty 1

## 2012-01-08 NOTE — Progress Notes (Signed)
Patient seen and examined this am . admission H&P reviewed  patient still has slurry speech . His wide informs him to be having short stepping gate.  CN II-XII wnl on exam, normal motor and sensory function. Cerebellar fn intact, gait not assessed and will reevaluate once he is back from tests Continue plan as outlined in H&P for CVA w/up Cont ASA. Check lipid panel  And hbA1C  Full code

## 2012-01-08 NOTE — ED Notes (Signed)
Pt. In room with hospitalist.

## 2012-01-08 NOTE — ED Provider Notes (Signed)
Medical screening examination/treatment/procedure(s) were conducted as a shared visit with non-physician practitioner(s) and myself.  I personally evaluated the patient during the encounter  [name], MD 01/08/12 1455

## 2012-01-08 NOTE — Progress Notes (Signed)
01/08/2012 [facility], Bosie Clos [facility] Case Management Note [phone]    CARE MANAGEMENT NOTE 01/08/2012  Patient:  Kyle Espinoza, Kyle Espinoza   Account Number:  [id number]  Date Initiated:  01/08/2012  Documentation initiated by:  Fransico Michael  Subjective/Objective Assessment:   admitted on 01/07/12 with c/o slurred speech.     Action/Plan:   prior to admission, patient lived at home with spouse and was independent with ADLs   Anticipated DC Date:  01/11/2012   Anticipated DC Plan:  HOME W HOME HEALTH SERVICES      DC Planning Services  CM consult      Choice offered to / List presented to:             Status of service:  In process, will continue to follow Medicare Important Message given?   (If response is "NO", the following Medicare IM given date fields will be blank) Date Medicare IM given:   Date Additional Medicare IM given:    Discharge Disposition:    Per UR Regulation:  Reviewed for med. necessity/level of care/duration of stay  Comments:  12/08/11-1332-J.Lutricia Horsfall  [phone]      66yo male patient admitted on 01/07/12 with c/o slurred speech. Prior to admission, patient lived at home with spouse and family support. In to see patient. Noted PT evaluation with recommendation for home health PT. Unable to speak to patient at present time. OT working with him. CM will continue to monitor for discharge needs.

## 2012-01-08 NOTE — Progress Notes (Signed)
Utilization review completed. Kyle Espinoza 01/08/2012 

## 2012-01-08 NOTE — Progress Notes (Signed)
OT Cancellation Note  Treatment cancelled today due to patient receiving procedure or test.  Kyle Espinoza, [name] 01/08/2012, 1:28 PM

## 2012-01-08 NOTE — Evaluation (Addendum)
Physical Therapy Evaluation Patient Details Name: Kyle Espinoza MRN: [id number] DOB: 10-Jan-1946 Today's Date: 01/08/2012  Problem List:  Patient Active Problem List  Diagnoses  . Slurred speech  . HTN (hypertension)    Past Medical History:  Past Medical History  Diagnosis Date  . Hypertension    Past Surgical History:  Past Surgical History  Procedure Date  . Neck surgery   . Hernia repair     PT Assessment/Plan/Recommendation PT Assessment Clinical Impression Statement: Patient is being worked up for CVA.  Patient with balance impairments and cognitive impairments as well.  He reports that his sister and friend Alona Bene can assist x 24 hours on d/c.  May need a RW for stability at d/c as well with HHPT f/u.   PT Recommendation/Assessment: Patient will need skilled PT in the acute care venue PT Problem List: Decreased strength;Decreased balance;Decreased mobility;Decreased activity tolerance;Decreased knowledge of use of DME;Decreased safety awareness PT Therapy Diagnosis : Difficulty walking PT Plan PT Frequency: Min 3X/week PT Treatment/Interventions: DME instruction;Gait training;Functional mobility training;Stair training;Therapeutic activities;Therapeutic exercise;Balance training;Patient/family education PT Recommendation Follow Up Recommendations: Home health PT Equipment Recommended: Rolling walker with 5" wheels PT Goals  Acute Rehab PT Goals PT Goal Formulation: With patient Time For Goal Achievement: 7 days Pt will go Supine/Side to Sit: Independently PT Goal: Supine/Side to Sit - Progress: Goal set today Pt will go Sit to Stand: Independently PT Goal: Sit to Stand - Progress: Goal set today Pt will Ambulate: >150 feet;with supervision;with least restrictive assistive device PT Goal: Ambulate - Progress: Goal set today Pt will Go Up / Down Stairs: 6-9 stairs;with supervision;with least restrictive assistive device PT Goal: Up/Down Stairs - Progress: Goal set  today  PT Evaluation Precautions/Restrictions  Precautions Precautions: Fall Required Braces or Orthoses: No Restrictions Weight Bearing Restrictions: No Prior Functioning  Home Living Lives With: Significant other (intermittent, 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 Type of Home: House Home Layout: One level Home Access: Stairs to enter Entrance Stairs-Rails: None Secretary/administrator of Steps: 8 Bathroom Shower/Tub: Health visitor: Handicapped height Bathroom Accessibility: Yes How Accessible: Accessible via walker Home Adaptive Equipment: None Prior Function Level of Independence: Independent with basic ADLs;Independent with homemaking with ambulation;Independent with gait;Independent with transfers Driving: Yes Vocation: Part time employment Comments: Works at [employer] in evenings Cognition Cognition Arousal/Alertness: Awake/alert Overall Cognitive Status: Appears within functional limits for tasks assessed Orientation Level: Oriented X4 Sensation/Coordination Sensation Light Touch: Appears Intact Stereognosis: Not tested Hot/Cold: Not tested Proprioception: Not tested Coordination Gross Motor Movements are Fluid and Coordinated: Yes Fine Motor Movements are Fluid and Coordinated: Yes Extremity Assessment RUE Assessment RUE Assessment: Within Functional Limits LUE Assessment LUE Assessment: Within Functional Limits RLE Assessment RLE Assessment: Within Functional Limits LLE Assessment LLE Assessment: Within Functional Limits Mobility (including Balance) Bed Mobility Bed Mobility: Yes Supine to Sit: 5: Supervision Sit to Supine: 5: Supervision Sit to Supine - Details (indicate cue type and reason): Assisted patient to supine position to assess for horizontal canal BPPV.  Negative test.   Transfers Transfers: Yes Sit to Stand: 5: Supervision;With upper extremity assist;From chair/3-in-1;With armrests Sit to Stand Details (indicate  cue type and reason): cues for hand placement.  Takes patient a few minutes to obtain balance at EOB. Stand to Sit: 5: Supervision;With upper extremity assist;With armrests;To chair/3-in-1 Ambulation/Gait Ambulation/Gait: Yes Ambulation/Gait Assistance: 4: Min assist Ambulation/Gait Assistance Details (indicate cue type and reason): Unsteady especially with head turns and head nods.  PAtient also reports dizziness with  body turning as well.   Ambulation Distance (Feet): 70 Feet Gait Pattern: Decreased stride length Stairs: No Wheelchair Mobility Wheelchair Mobility: No  Posture/Postural Control Posture/Postural Control: Postural limitations Postural Limitations: Patient does not move neck (perform head turns or nods) with commands.  Very limited movement. Balance Balance Assessed: Yes Static Sitting Balance Static Sitting - Balance Support: No upper extremity supported;Feet supported Static Sitting - Level of Assistance: 5: Stand by assistance Static Sitting - Comment/# of Minutes: Patient sat EOB x 10 minutes.  Patient assessed for vestibular issues.  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 with poor comprehension of commands to perform exercise.  High Level Balance High Level Balance Activites: Turns;Sudden stops;Head turns High Level Balance Comments: All movements cause incr dizziness  Exercise    End of Session PT - End of Session Equipment Utilized During Treatment: Gait belt Activity Tolerance: Patient tolerated treatment well Patient left: in chair;with call bell in reach Nurse Communication: Mobility status for transfers;Mobility status for ambulation General Behavior During Session: [facility] for tasks performed Cognition: Impaired Cognitive Impairment: Patient appeared to have difficulty following commands for exercises.  Oriented but had trouble understanding  directions.  INGOLD,Jashon Ishida 01/08/2012, 1:13 PM  [facility] [phone] (pager)

## 2012-01-08 NOTE — H&P (Signed)
Kyle Espinoza is an 66 y.o. male.   PCP - Dr.Pharr. Chief Complaint: Slurred speech. HPI: 66 year-old male with history of hypertension, hyperlipidemia was brought into the ER because of sounds confusion and slurred speech with difficulty walking since he woke up yesterday morning at 6:30 AM. Patient was brought to the 9 AM by his wife. Patient had CAT scan of the head which did not show an acute. Patient has been treated for sinusitis last week with antibiotics and is being taking it almost 6 days now. Patient did not have any headache chest pain nausea vomiting or any loss of consciousness. As patient's speech is still persistent with some dizziness and also difficulty walking patient admitted for further workup. Patient usually takes pain relief medications and has not taken it for last 2-3 weeks. This information was provided by patient's wife. When I examined the patient is to have slurred speech and is able to move his upper and lower extremities and has no pronator drift.  Past Medical History  Diagnosis Date  . Hypertension     Past Surgical History  Procedure Date  . Neck surgery   . Hernia repair     History reviewed. No pertinent family history. Social History:  reports that he has never smoked. He does not have any smokeless tobacco history on file. He reports that he does not drink alcohol or use illicit drugs.  Allergies:  Allergies  Allergen Reactions  . Azithromycin Rash    No current facility-administered medications on file as of 01/07/2012.   No current outpatient prescriptions on file as of 01/07/2012.    Results for orders placed during the hospital encounter of 01/07/12 (from the past 48 hour(s))  CBC     Status: Abnormal   Collection Time   01/07/12 10:52 AM      Component Value Range Comment   WBC 4.1  4.0 - 10.5 (K/uL)    RBC 4.16 (*) 4.22 - 5.81 (MIL/uL)    Hemoglobin 12.0 (*) 13.0 - 17.0 (g/dL)    HCT 16.1 (*) 09.6 - 52.0 (%)    MCV 84.4  78.0 - 100.0 (fL)      MCH 28.8  26.0 - 34.0 (pg)    MCHC 34.2  30.0 - 36.0 (g/dL)    RDW 04.5  40.9 - 81.1 (%)    Platelets 91 (*) 150 - 400 (K/uL) PLATELET COUNT CONFIRMED BY SMEAR  DIFFERENTIAL     Status: Abnormal   Collection Time   01/07/12 10:52 AM      Component Value Range Comment   Neutrophils Relative 77  43 - 77 (%)    Neutro Abs 3.1  1.7 - 7.7 (K/uL)    Lymphocytes Relative 12  12 - 46 (%)    Lymphs Abs 0.5 (*) 0.7 - 4.0 (K/uL)    Monocytes Relative 11  3 - 12 (%)    Monocytes Absolute 0.4  0.1 - 1.0 (K/uL)    Eosinophils Relative 0  0 - 5 (%)    Eosinophils Absolute 0.0  0.0 - 0.7 (K/uL)    Basophils Relative 0  0 - 1 (%)    Basophils Absolute 0.0  0.0 - 0.1 (K/uL)   BASIC METABOLIC PANEL     Status: Abnormal   Collection Time   01/07/12 10:52 AM      Component Value Range Comment   Sodium 143  135 - 145 (mEq/L)    Potassium 3.8  3.5 - 5.1 (mEq/L)  Chloride 109  96 - 112 (mEq/L)    CO2 20  19 - 32 (mEq/L)    Glucose, Bld 135 (*) 70 - 99 (mg/dL)    BUN 14  6 - 23 (mg/dL)    Creatinine, Ser 1.30  0.50 - 1.35 (mg/dL)    Calcium 9.0  8.4 - 10.5 (mg/dL)    GFR calc non Af Amer 69 (*) >90 (mL/min)    GFR calc Af Amer 80 (*) >90 (mL/min)   URINALYSIS, WITH MICROSCOPIC     Status: Abnormal   Collection Time   01/07/12 12:35 PM      Component Value Range Comment   Color, Urine YELLOW  YELLOW     APPearance CLEAR  CLEAR     Specific Gravity, Urine 1.013  1.005 - 1.030     pH 7.0  5.0 - 8.0     Glucose, UA NEGATIVE  NEGATIVE (mg/dL)    Hgb urine dipstick SMALL (*) NEGATIVE     Bilirubin Urine NEGATIVE  NEGATIVE     Ketones, ur NEGATIVE  NEGATIVE (mg/dL)    Protein, ur NEGATIVE  NEGATIVE (mg/dL)    Urobilinogen, UA 0.2  0.0 - 1.0 (mg/dL)    Nitrite NEGATIVE  NEGATIVE     Leukocytes, UA NEGATIVE  NEGATIVE     RBC / HPF 3-6  <3 (RBC/hpf)    Dg Chest 2 View  01/07/2012  *RADIOLOGY REPORT*  Clinical Data: Weakness, altered mental status  CHEST - 2 VIEW  Comparison: 01/10/2008, 10/30/2005   Findings: Decreased lung volumes with basilar atelectasis.  Normal heart size and slight vascular prominence related to poor inspiration.  No definite focal pneumonia, edema, effusion or pneumothorax.  Degenerative changes of the spine.  IMPRESSION: Low volume exam with atelectasis  Original Report Authenticated By: Judie Petit. Ruel Favors, M.D.   Ct Head Wo Contrast  01/07/2012  *RADIOLOGY REPORT*  Clinical Data: Weakness, fatigue, slurred speech  CT HEAD WITHOUT CONTRAST  Technique:  Contiguous axial images were obtained from the base of the skull through the vertex without contrast.  Comparison: 12/06/2005  Findings: No acute intracranial hemorrhage, mass lesion, definite infarction, or focal edema.  No hydrocephalus, herniation, midline shift, or extra-axial fluid collection.  Gray-white matter differentiation is maintained.  Cisterns patent.  No cerebellar abnormality.  Symmetric orbits.  Mastoids and sinuses remain clear.  IMPRESSION: Stable exam.  No acute intracranial finding.  Original Report Authenticated By: Judie Petit. Ruel Favors, M.D.    Review of Systems  Constitutional: Negative.   HENT: Negative.   Eyes: Negative.   Respiratory: Negative.   Cardiovascular: Negative.   Gastrointestinal: Negative.   Genitourinary: Negative.   Musculoskeletal: Negative.   Skin: Negative.   Neurological: Positive for dizziness.       Slurred speech and difficulty walking.  Endo/Heme/Allergies: Negative.   Psychiatric/Behavioral: Negative.     Blood pressure 140/76, pulse 82, temperature 97.5 F (36.4 C), temperature source Oral, resp. rate 16, SpO2 100.00%. Physical Exam  Constitutional: He is oriented to person, place, and time. He appears well-developed and well-nourished. No distress.  HENT:  Head: Normocephalic and atraumatic.  Right Ear: External ear normal.  Left Ear: External ear normal.  Mouth/Throat: Oropharynx is clear and moist. No oropharyngeal exudate.  Eyes: Conjunctivae and EOM are normal.  Pupils are equal, round, and reactive to light. Right eye exhibits no discharge. Left eye exhibits no discharge. No scleral icterus.  Neck: Normal range of motion. Neck supple.  Cardiovascular: Normal rate, regular rhythm and  normal heart sounds.   Respiratory: Effort normal and breath sounds normal. No respiratory distress. He has no wheezes. He has no rales.  GI: Soft. Bowel sounds are normal. He exhibits no distension. There is no tenderness. There is no rebound.  Musculoskeletal: Normal range of motion. He exhibits no edema and no tenderness.  Neurological: He is alert and oriented to person, place, and time.       Moves upper and lower extremity 5/5. No pronator drift. No facial asymmetry.  Skin: He is not diaphoretic.     Assessment/Plan #1. Slurred speech with difficulty walking we'll have to rule out CVA - we will admit patient to telemetry, get swallow evaluation and place patient on neurologic checks. Her MRI/MRA of the brain. Carotid Doppler and 2-D echo. Patient presently is in sinus rhythm. We will also check urine drug screen. #2. History of hypertension and hyperlipidemia - continue present medications.  CODE STATUS - full code.  Lauraine Crespo N. 01/08/2012, 1:04 AM

## 2012-01-08 NOTE — Progress Notes (Signed)
  Echocardiogram 2D Echocardiogram has been performed.  Cathie Beams Deneen 01/08/2012, 5:00 PM

## 2012-01-09 DIAGNOSIS — I6350 Cerebral infarction due to unspecified occlusion or stenosis of unspecified cerebral artery: Secondary | ICD-10-CM

## 2012-01-09 DIAGNOSIS — G459 Transient cerebral ischemic attack, unspecified: Secondary | ICD-10-CM | POA: Diagnosis present

## 2012-01-09 HISTORY — PX: TRANSTHORACIC ECHOCARDIOGRAM: SHX275

## 2012-01-09 LAB — CBC
Hemoglobin: 11.7 g/dL — ABNORMAL LOW (ref 13.0–17.0)
MCH: 29 pg (ref 26.0–34.0)
MCV: 85.1 fL (ref 78.0–100.0)
RBC: 4.04 MIL/uL — ABNORMAL LOW (ref 4.22–5.81)

## 2012-01-09 MED ORDER — ASPIRIN 325 MG PO TBEC: 325.0000 mg | DELAYED_RELEASE_TABLET | Freq: Every day | ORAL | Status: DC

## 2012-01-09 NOTE — Progress Notes (Signed)
Reviewed discharge instructions with patient and wife, they stated their understanding.  Also reviewed, signs and symptoms of stroke and importance of calling 911.  Patient discharged via wheelchair.  Colman Cater

## 2012-01-09 NOTE — Progress Notes (Signed)
01/09/2012 Kyle Espinoza Case Management Note [id number]  HOME HEALTH AGENCIES SERVING [facility]   Agencies that are Medicare-Certified and are affiliated with [facility] Agency  Telephone Number Address  [facility]   [facility] has ownership interest in this company; however, you are under no obligation to use this agency. [phone] or  [phone] [address] [place], [place] [address]   Agencies that are Medicare-Certified and are not affiliated with [facility] Telephone Number Address  [facility] [phone] Fax [phone] [address] [place], [place]  [address]  [facility] [phone] or [phone] Fax [phone] [address] [place], [place] [address]  [facility] [phone] Fax [phone] [address] [place], [place] [address]  [facility] [phone] Fax [phone] [address] [place], [place]  [address]  [facility] [phone] Fax [phone] [address] Suite [place], [place] [address]  [facility] [phone] [address] [place], [place] [address]  [facility] [phone]  [address] [place], [place] [address]  [facility] [phone] or [phone] Fax [phone] [place], [place]  [address]  [facility] [phone] Fax [phone] [address] [place], [place]  [address]  [facility]  [phone] Fax [phone] [address] [place], [place] [address]               Agencies that are not Medicare-Certified and are not affiliated with [facility] Agency Telephone Number Address  [facility] [place] [phone] or [phone] Fax [phone] [place]  [address]  [facility] [phone] Fax [phone] [address] [place], [place]  [address]  [facility]  [phone] Fax [phone] [address] [place], [place] [address]  [facility] [phone] Fax [phone] [address] [place], [place] [address]  [facility] [phone] or [phone] Fax [phone] [address] [place], [place]  [address]  [facility] [phone] or [phone] Fax [phone] [address] [place], [place]  [address]  [facility] [phone] Fax [phone] [address] [place], [place]  [address]  [facility] [phone] [address] [place], [place]  [address]  [facility] [phone] Fax [phone] [address] [place], [place]  [address]  [facility] [phone] Fax [phone] [address] [place], [place]  [address]  [facility] [phone] Fax [phone] [address] [place], [place] [address]  [facility] [phone] Fax [phone] [address] [place], [place]  [address]      Noted PT recommendation for home health PT.  In to see patient to offer choice of agencies. [facility]. Appropriate referrals made.

## 2012-01-09 NOTE — Evaluation (Signed)
Occupational Therapy Evaluation Patient Details Name: Kyle Espinoza MRN: [id number] DOB: Dec 22, 1945 Today's Date: 01/09/2012  Problem List:  Patient Active Problem List  Diagnoses  . Slurred speech  . HTN (hypertension)    Past Medical History:  Past Medical History  Diagnosis Date  . Hypertension    Past Surgical History:  Past Surgical History  Procedure Date  . Neck surgery   . Hernia repair     OT Assessment/Plan/Recommendation OT Assessment Clinical Impression Statement: Pt presents to OT close to baseline with simple ADL activity. Discussed used of cane with ADL activity as he discussed with PT.  Pt and wife agree.  Do not feel further OT indicated at this time. 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 Type of Home: House Home Layout: One level Home Access: Stairs to enter Entrance Stairs-Rails: None Secretary/administrator of Steps: 8 Bathroom Shower/Tub: Health visitor: Handicapped height Bathroom Accessibility: Yes How Accessible: Accessible via walker Home Adaptive Equipment: None Prior Function Level of Independence: Independent with basic ADLs;Independent with homemaking with ambulation;Independent with gait;Independent with transfers Driving: Yes Vocation: Part time employment ADL ADL Grooming: Performed;Wash/dry hands Where Assessed - Grooming: Standing at sink Upper Body Bathing: Simulated;Set up Where Assessed - Upper Body Bathing: Sit to stand from chair Lower Body Bathing: Simulated;Set up Where Assessed - Lower Body Bathing: Sit to stand  from chair Upper Body Dressing: Simulated;Supervision/safety Where Assessed - Upper Body Dressing: Sitting, chair;Unsupported Lower Body Dressing: Simulated;Supervision/safety Where Assessed - Lower Body Dressing: Sit to stand from chair Toilet Transfer: Performed;Supervision/safety Toilet Transfer Method: Proofreader: Regular height toilet Toileting - Clothing Manipulation: Performed;Supervision/safety Where Assessed - Glass blower/designer Manipulation: Standing Toileting - Hygiene: Performed Where Assessed - Toileting Hygiene: Standing Tub/Shower Transfer: Supervision/safety;Performed Tub/Shower Transfer Method: Science writer: Walk in shower Vision/Perception  Vision - History Baseline Vision: Wears glasses all the time Cognition Cognition Arousal/Alertness: Awake/alert Overall Cognitive Status: Appears within functional limits for tasks assessed Orientation Level: Oriented X4    Extremity Assessment RUE Assessment RUE Assessment: Within Functional Limits LUE Assessment LUE Assessment: Within Functional Limits    End of Session OT - End of Session Activity Tolerance: Patient tolerated treatment well Patient left: in chair;with call bell in reach;with family/visitor present General Behavior During Session: [facility] for tasks performed Cognition: [facility] for tasks performed   Lionardo Haze, [name] 01/09/2012, 3:18 PM

## 2012-01-09 NOTE — Progress Notes (Signed)
Speech Language/Pathology Speech Language Pathology Evaluation Patient Details Name: Kyle Espinoza MRN: [id number] DOB: Nov 16, 1946 Today's Date: 01/09/2012  Problem List:  Patient Active Problem List  Diagnoses  . Slurred speech  . HTN (hypertension)   Past Medical History:  Past Medical History  Diagnosis Date  . Hypertension    Past Surgical History:  Past Surgical History  Procedure Date  . Neck surgery   . Hernia repair     SLP Assessment/Plan/Recommendation Clinical Impression: Pt presents with functional language/cognition; mild memory deficits at baseline; mild dysarthria with imprecision of consonants, however, this does not impact intelligibility.  No further SLP f/u is warranted.  Pt/wife educated/agree. SLP Recommendation/Assessment: Patient does not need any further Speech Language Pathology Services  No Skilled Speech Therapy: Patient at baseline level of functioning  Tobin Witucki L. Samson [place], [place] CCC/SLP Pager [phone] Blenda Mounts Laurice 01/09/2012, 10:09 AM

## 2012-01-09 NOTE — Progress Notes (Signed)
01/09/2012 [facility], Bosie Clos SPARKS Case Management Note [phone]    CARE MANAGEMENT NOTE 01/09/2012  Patient:  Kyle Espinoza, Kyle Espinoza   Account Number:  [id number]  Date Initiated:  01/08/2012  Documentation initiated by:  Fransico Michael  Subjective/Objective Assessment:   admitted on 01/07/12 with c/o slurred speech.     Action/Plan:   prior to admission, patient lived at home with spouse and was independent with ADLs   Anticipated DC Date:  01/11/2012   Anticipated DC Plan:  HOME W HOME HEALTH SERVICES      DC Planning Services  CM consult      [facility] Choice  HOME HEALTH  DURABLE MEDICAL EQUIPMENT   Choice offered to / List presented to:  C-1 Patient   DME arranged  Levan Hurst      DME agency  [facility]     HH arranged  HH-2 PT      [facility] agency  [facility]   Status of service:  Completed, signed off Medicare Important Message given?   (If response is "NO", the following Medicare IM given date fields will be blank) Date Medicare IM given:   Date Additional Medicare IM given:    Discharge Disposition:  HOME W HOME HEALTH SERVICES  Per UR Regulation:  Reviewed for med. necessity/level of care/duration of stay  Comments:  01/09/12-1013-J.Kelven Flater,RN,BSN  [phone]      In to see patient this am. Choice of home health agencies offered to patient. [facility]. Hilda Lias, RN and Derrian with [facility] notified of need for home health PT and rolling walker with 5" wheels. No other discharge needs identified. Patient to be discharged to home with home health today.  01/08/12-1332-J.Lutricia Horsfall  [phone]      66yo male patient admitted on 01/07/12 with c/o slurred speech. Prior to admission, patient lived at home with spouse and family support. In to see patient. Noted PT evaluation with recommendation for home health PT. Unable to speak to patient at present time. OT working with him. CM will continue to monitor for discharge needs.

## 2012-01-09 NOTE — Progress Notes (Signed)
Physical Therapy Treatment Patient Details Name: WARREN LINDAHL MRN: [id number] DOB: 03-14-1946 Today's Date: 01/09/2012 Tx 10:30-11:05  PT Assessment/Plan  PT - Assessment/Plan Comments on Treatment Session: Pt at increased fall risk as noted by decreased balance strategies, decreased ability to turn head with movment, fear of falling, and verbalizes this. Pt tried both RW and cane and demonstrated improved safety with gait with both but prefers cane and agrees to use this device at d/c for fall prevention and would like one ordered. Family present and aware. PT Plan: Discharge plan remains appropriate Equipment Recommended: Cane PT Goals  Acute Rehab PT Goals PT Goal: Sit to Stand - Progress: Progressing toward goal PT Goal: Ambulate - Progress: Progressing toward goal  PT Treatment Precautions/Restrictions  Precautions Precautions: Fall Precaution Comments: HOH Required Braces or Orthoses: No Restrictions Weight Bearing Restrictions: No Mobility (including Balance) Transfers Sit to Stand: 6: Modified independent (Device/Increase time);Without upper extremity assist;With armrests;From chair/3-in-1 Sit to Stand Details (indicate cue type and reason): initially S with RW d/t cues for hand placement, but reached mod I, mod I without device and with cane Stand to Sit: 6: Modified independent (Device/Increase time) Ambulation/Gait Ambulation/Gait Assistance: 5: Supervision Ambulation/Gait Assistance Details (indicate cue type and reason): close S without device, unsteady with no arm swing, deliberate steps, unable to turn head while moving, unable to change cadence, decreased velocity, idid Barrows correct minor staggers, began trials with gait devices Ambulation Distance (Feet): 225 Feet (x 3) Assistive device: 4-wheeled walker;Straight cane Gait velocity: able to withstand mild perturbations with devices during gait, no LOB with sudden stops or changes in direction, increased confidence  with both but pt prefers cane Stairs: No  Posture/Postural Control Posture/Postural Control: Postural limitations Postural Limitations: decreased hip strategy, early stepping strategy for balance reactions Exercise  General Exercises - Lower Extremity Toe Raises: 10 reps;AROM Heel Raises: AROM;Both;10 reps Mini-Sqauts: 10 reps;AROM Other Exercises Other Exercises: stabilization with external perturbations Other Exercises: repetitive sit to stands for strengthening End of Session PT - End of Session Equipment Utilized During Treatment: Gait belt Activity Tolerance: Patient tolerated treatment well Patient left: in chair;with family/visitor present General Behavior During Session: [facility] for tasks performed Cognition: [facility] for tasks performed  Michaelene Song 01/09/2012, 11:17 AM

## 2012-01-09 NOTE — Discharge Summary (Signed)
Patient ID: Kyle Espinoza MRN: [id number] DOB/AGE: 08-30-1946 66 y.o.  Admit date: 01/07/2012 Discharge date: 01/09/2012  Primary Care Physician:  Londell Moh, MD, MD  Discharge Diagnoses:   Principal Problem:  *TIA (transient ischemic attack)  Active Problems:  Slurred speech  HTN (hypertension) Hypothyroidism   Medication List  As of 01/09/2012  4:55 PM   STOP taking these medications         cefUROXime 500 MG tablet      cyclobenzaprine 10 MG tablet      HYDROcodone-acetaminophen 5-500 MG per tablet         TAKE these medications         amLODipine 10 MG tablet   Commonly known as: NORVASC   Take 10 mg by mouth daily.      aspirin 325 MG EC tablet   Take 1 tablet (325 mg total) by mouth daily.      cetirizine 10 MG tablet   Commonly known as: ZYRTEC   Take 10 mg by mouth daily.      fenofibrate 145 MG tablet   Commonly known as: TRICOR   Take 145 mg by mouth daily.      Fish Oil 1000 MG Caps   Take 3,000 mg by mouth 2 (two) times daily.      levothyroxine 112 MCG tablet   Commonly known as: SYNTHROID, LEVOTHROID   Take 112 mcg by mouth daily.      promethazine 25 MG tablet   Commonly known as: PHENERGAN   Take 25 mg by mouth every 4 (four) hours as needed. For nausea            Disposition and Follow-up:  folow up with PCP in 1 week  Consults: none  Significant Diagnostic Studies:  Dg Chest 2 View  01/07/2012  *RADIOLOGY REPORT*  Clinical Data: Weakness, altered mental status  CHEST - 2 VIEW  Comparison: 01/10/2008, 10/30/2005  Findings: Decreased lung volumes with basilar atelectasis.  Normal heart size and slight vascular prominence related to poor inspiration.  No definite focal pneumonia, edema, effusion or pneumothorax.  Degenerative changes of the spine.  IMPRESSION: Low volume exam with atelectasis  Original Report Authenticated By: Judie Petit. Ruel Favors, M.D.   Ct Head Wo Contrast  01/07/2012  *RADIOLOGY REPORT*  Clinical Data: Weakness,  fatigue, slurred speech  CT HEAD WITHOUT CONTRAST  Technique:  Contiguous axial images were obtained from the base of the skull through the vertex without contrast.  Comparison: 12/06/2005  Findings: No acute intracranial hemorrhage, mass lesion, definite infarction, or focal edema.  No hydrocephalus, herniation, midline shift, or extra-axial fluid collection.  Gray-white matter differentiation is maintained.  Cisterns patent.  No cerebellar abnormality.  Symmetric orbits.  Mastoids and sinuses remain clear.  IMPRESSION: Stable exam.  No acute intracranial finding.  Original Report Authenticated By: Judie Petit. Ruel Favors, M.D.    Brief H and P: For complete details please refer to admission H and P, but in brief 66 year-old male with history of hypertension, hyperlipidemia was brought into the ER because of sounds confusion and slurred speech with difficulty walking since he woke up yesterday morning at 6:30 AM. Patient was brought to the 9 AM by his wife. Patient had CAT scan of the head which did not show an acute. Patient has been treated for sinusitis last week with antibiotics and is being taking it almost 6 days now. Patient did not have any headache chest pain nausea vomiting or any loss of consciousness.  As patient's speech is still persistent with some dizziness and also difficulty walking patient admitted for further workup. Patient usually takes pain relief medications and has not taken it for last 2-3 weeks. This information was provided by patient's wife. When I examined the patient is to have slurred speech and is able to move his upper and lower extremities and has no pronator drift.    Physical Exam on Discharge:  Filed Vitals:   01/09/12 0812 01/09/12 1106 01/09/12 1219 01/09/12 1609  BP: 145/75  128/60 113/68  Pulse: 69 67 72 65  Temp: 97.6 F (36.4 C)  98.3 F (36.8 C) 98.7 F (37.1 C)  TempSrc: Oral  Oral Oral  Resp: 20  18 17   Height:      Weight:      SpO2: 100% 91% 100% 99%      Intake/Output Summary (Last 24 hours) at 01/09/12 1655 Last data filed at 01/09/12 1000  Gross per 24 hour  Intake 1381.25 ml  Output    550 ml  Net 831.25 ml    General: elderly male in no acute distress. HEENT:no pallor, moist oral mucosa CVS: S1 &S2 regular, no Murmurs, rubs or gallop Lungs: Clear to auscultation bilaterally. Abdomen: Soft, nontender, nondistended, positive bowel sounds. Extremities: No clubbing cyanosis or edema with positive pedal pulses. Neuro: AAOX3, cranial ns II-XIIM intact, speech normal, normal motor and sensory fn, cerebellar fn including gait wnl,   CBC:    Component Value Date/Time   WBC 3.0* 01/09/2012 1308   WBC 3.8* 11/04/2010 0852   HGB 11.7* 01/09/2012 1308   HGB 12.0* 11/04/2010 0852   HCT 34.4* 01/09/2012 1308   HCT 34.3* 11/04/2010 0852   PLT 95* 01/09/2012 1308   PLT 107* 11/04/2010 0852   MCV 85.1 01/09/2012 1308   MCV 88.6 11/04/2010 0852   NEUTROABS 3.1 01/07/2012 1052   NEUTROABS 2.4 11/04/2010 0852   LYMPHSABS 0.5* 01/07/2012 1052   LYMPHSABS 0.8* 11/04/2010 0852   MONOABS 0.4 01/07/2012 1052   MONOABS 0.6 11/04/2010 0852   EOSABS 0.0 01/07/2012 1052   EOSABS 0.0 11/04/2010 0852   BASOSABS 0.0 01/07/2012 1052   BASOSABS 0.0 11/04/2010 0852    Basic Metabolic Panel:    Component Value Date/Time   NA 141 01/08/2012 0615   K 3.4* 01/08/2012 0615   CL 109 01/08/2012 0615   CO2 20 01/08/2012 0615   BUN 16 01/08/2012 0615   CREATININE 1.00 01/08/2012 0615   GLUCOSE 114* 01/08/2012 0615   CALCIUM 9.1 01/08/2012 0615    Hospital Course:    *TIA (transient ischemic attack) Patient Slurred speech with difficulty walking for past few days . CT head , MRI brain negative for CVA. 2D echo done showed grade 1 diastolic dysfunction. Carotid doppler preliminary negative for stenosis.  Serial CE negative, utox negative Patient stable on telemetry and his symptoms now resolved.   Patient possibly had a TIA which has now resolved . He is on baby aspirin at home and i  will increase dose to 325 mg daily  lipid panel stable, Hb A1C of 6  patient seen by PT/OT and recommended cane to assist ambulation.  his BP remains stable Patient has hx of mild pancytopenia and is chronic and stable  Patient clinically stable to be discharged with outpt follow up with his PCP. Counseled on compliance with diet and medication       Time spent on Discharge: 45 MINUTES  Signed: Manali Mcelmurry 01/09/2012, 4:55 PM

## 2012-01-09 NOTE — Progress Notes (Signed)
*  PRELIMINARY RESULTS* Vascular Ultrasound Carotid Duplex (Doppler) has been completed.  Preliminary findings: No significant ICA stenosis noted bilaterally  Kyle Espinoza 01/09/2012, 4:13 PM

## 2012-07-17 ENCOUNTER — Ambulatory Visit (AMBULATORY_SURGERY_CENTER): Payer: Medicare Other

## 2012-07-17 ENCOUNTER — Encounter: Payer: Self-pay | Admitting: Gastroenterology

## 2012-07-17 ENCOUNTER — Telehealth: Payer: Self-pay

## 2012-07-17 VITALS — Ht 70.0 in | Wt 166.5 lb

## 2012-07-17 DIAGNOSIS — Z860100 Personal history of colon polyps, unspecified: Secondary | ICD-10-CM

## 2012-07-17 DIAGNOSIS — Z1211 Encounter for screening for malignant neoplasm of colon: Secondary | ICD-10-CM

## 2012-07-17 MED ORDER — MOVIPREP 100 G PO SOLR: ORAL | Status: DC

## 2012-07-17 NOTE — Progress Notes (Signed)
Pt came into the office today for his pre-visit prior to the colonoscopy with Dr Christella Hartigan on 07/31/12.Pt has some hearing loss and his wife Alona Bene) will be with him to help with questions at his colonoscopy appt. Pt also had a colonoscopy done 10 years ago with Dr Virginia Rochester. A medical release form was filled out and given to Patty (CMA )

## 2012-07-17 NOTE — Telephone Encounter (Signed)
Message copied by Donata Duff on Wed Jul 17, 2012  1:14 PM ------      Message from: Heloise Purpura      Created: Wed Jul 17, 2012 11:59 AM       Crist Kruszka,This pt is scheduled for a colonoscopy with Dr Christella Hartigan on 07/31/12. He had a colonoscopy done 10 years ago with Dr. Virginia Rochester. I will bring you the medical release form. Thanks,Janis

## 2012-07-17 NOTE — Telephone Encounter (Signed)
Colon and path are in E chart

## 2012-07-26 ENCOUNTER — Telehealth: Payer: Self-pay | Admitting: Gastroenterology

## 2012-07-26 NOTE — Telephone Encounter (Signed)
Colonoscopy Dr. Virginia Rochester 12/2001 for 'rectal bleeding.'  Found small cecal polyp, HP on pathology.

## 2012-07-31 ENCOUNTER — Encounter: Payer: Self-pay | Admitting: Gastroenterology

## 2012-07-31 ENCOUNTER — Ambulatory Visit (AMBULATORY_SURGERY_CENTER): Payer: Medicare Other | Admitting: Gastroenterology

## 2012-07-31 VITALS — BP 159/80 | HR 77 | Temp 96.3°F | Resp 20 | Ht 70.0 in | Wt 166.0 lb

## 2012-07-31 DIAGNOSIS — Z860100 Personal history of colon polyps, unspecified: Secondary | ICD-10-CM

## 2012-07-31 DIAGNOSIS — Z1211 Encounter for screening for malignant neoplasm of colon: Secondary | ICD-10-CM

## 2012-07-31 MED ORDER — SODIUM CHLORIDE 0.9 % IV SOLN: 500.0000 mL | INTRAVENOUS | Status: DC

## 2012-07-31 NOTE — Progress Notes (Signed)
Patient did not experience any of the following events: a burn prior to discharge; a fall within the facility; wrong site/side/patient/procedure/implant event; or a hospital transfer or hospital admission upon discharge from the facility. (G8907) Patient did not have preoperative order for IV antibiotic SSI prophylaxis. (G8918)  

## 2012-07-31 NOTE — Patient Instructions (Signed)
Normal colon exam today. Repeat colonoscopy in 10 years. Call us with any questions or concerns. Thank you!!  YOU HAD AN ENDOSCOPIC PROCEDURE TODAY AT THE [facility]: Refer to the procedure report that was given to you for any specific questions about what was found during the examination.  If the procedure report does not answer your questions, please call your gastroenterologist to clarify.  If you requested that your care partner not be given the details of your procedure findings, then the procedure report has been included in a sealed envelope for you to review at your convenience later.  YOU SHOULD EXPECT: Some feelings of bloating in the abdomen. Passage of more gas than usual.  Walking can help get rid of the air that was put into your GI tract during the procedure and reduce the bloating. If you had a lower endoscopy (such as a colonoscopy or flexible sigmoidoscopy) you may notice spotting of blood in your stool or on the toilet paper. If you underwent a bowel prep for your procedure, then you may not have a normal bowel movement for a few days.  DIET: Your first meal following the procedure should be a light meal and then it is ok to progress to your normal diet.  A half-sandwich or bowl of soup is an example of a good first meal.  Heavy or fried foods are harder to digest and may make you feel nauseous or bloated.  Likewise meals heavy in dairy and vegetables can cause extra gas to form and this can also increase the bloating.  Drink plenty of fluids but you should avoid alcoholic beverages for 24 hours.  ACTIVITY: Your care partner should take you home directly after the procedure.  You should plan to take it easy, moving slowly for the rest of the day.  You can resume normal activity the day after the procedure however you should NOT DRIVE or use heavy machinery for 24 hours (because of the sedation medicines used during the test).    SYMPTOMS TO REPORT IMMEDIATELY: A  gastroenterologist can be reached at any hour.  During normal business hours, 8:30 AM to 5:00 PM Monday through Friday, call [phone].  After hours and on weekends, please call the GI answering service at [phone] who will take a message and have the physician on call contact you.   Following lower endoscopy (colonoscopy or flexible sigmoidoscopy):  Excessive amounts of blood in the stool  Significant tenderness or worsening of abdominal pains  Swelling of the abdomen that is new, acute  Fever of 100F or higher  FOLLOW UP: If any biopsies were taken you will be contacted by phone or by letter within the next 1-3 weeks.  Call your gastroenterologist if you have not heard about the biopsies in 3 weeks.  Our staff will call the home number listed on your records the next business day following your procedure to check on you and address any questions or concerns that you may have at that time regarding the information given to you following your procedure. This is a courtesy call and so if there is no answer at the home number and we have not heard from you through the emergency physician on call, we will assume that you have returned to your regular daily activities without incident.  SIGNATURES/CONFIDENTIALITY: You and/or your care partner have signed paperwork which will be entered into your electronic medical record.  These signatures attest to the fact that that the information above   on your After Visit Summary has been reviewed and is understood.  Full responsibility of the confidentiality of this discharge information lies with you and/or your care-partner.  

## 2012-07-31 NOTE — Op Note (Signed)
[facility] [address] [place], [address]   COLONOSCOPY PROCEDURE REPORT  PATIENT: Kyle Espinoza, Kyle Espinoza  MR#: [id number] BIRTHDATE: 1946/06/07 , 65  yrs. old GENDER: Male ENDOSCOPIST: Rachael Fee, MD REFERRED [id number] Terri Piedra, M.D. PROCEDURE DATE:  07/31/2012 PROCEDURE:   Colonoscopy, screening ASA CLASS:   Class II INDICATIONS:average risk screening. MEDICATIONS: Fentanyl 75 mcg IV, Versed 8 mg IV, and These medications were titrated to patient response per physician's verbal order  DESCRIPTION OF PROCEDURE:   After the risks benefits and alternatives of the procedure were thoroughly explained, informed consent was obtained.  A digital rectal exam revealed no abnormalities of the rectum.   The LB [id number]  endoscope was introduced through the anus and advanced to the cecum, which was identified by both the appendix and ileocecal valve. No adverse events experienced.   The quality of the prep was good.  The instrument was then slowly withdrawn as the colon was fully examined.    COLON FINDINGS: A normal appearing cecum, ileocecal valve, and appendiceal orifice were identified.  The ascending, hepatic flexure, transverse, splenic flexure, descending, sigmoid colon and rectum appeared unremarkable.  No polyps or cancers were seen. Retroflexed views revealed no abnormalities. The time to cecum=4 minutes 30 seconds.  Withdrawal time=10 minutes 39 seconds.  The scope was withdrawn and the procedure completed. COMPLICATIONS: There were no complications.  ENDOSCOPIC IMPRESSION: Normal colon No polyps or cancers  RECOMMENDATIONS: You should continue to follow colorectal cancer screening guidelines for "routine risk" patients with a repeat colonoscopy in 10 years. There is no need for FOBT (stool) testing for at least 5 years.    eSigned:  Rachael Fee, MD 07/31/2012 9:23 AM

## 2012-08-01 ENCOUNTER — Telehealth: Payer: Self-pay | Admitting: *Deleted

## 2012-08-01 NOTE — Telephone Encounter (Signed)
  Follow up Call-  Call back number 07/31/2012  Post procedure Call Back phone  # [phone]  Permission to leave phone message Yes     Patient questions:  Do you have a fever, pain , or abdominal swelling? yes Pain Score  0 *  Have you tolerated food without any problems? yes  Have you been able to return to your normal activities? yes  Do you have any questions about your discharge instructions: Diet   no Medications  no Follow up visit  no  Do you have questions or concerns about your Care? no  Actions: * If pain score is 4 or above: No action needed, pain <4.

## 2013-07-02 ENCOUNTER — Other Ambulatory Visit: Payer: Self-pay | Admitting: Internal Medicine

## 2013-07-02 DIAGNOSIS — R0989 Other specified symptoms and signs involving the circulatory and respiratory systems: Secondary | ICD-10-CM

## 2013-07-02 DIAGNOSIS — R161 Splenomegaly, not elsewhere classified: Secondary | ICD-10-CM

## 2013-07-03 ENCOUNTER — Other Ambulatory Visit: Payer: Self-pay | Admitting: Internal Medicine

## 2013-07-03 DIAGNOSIS — R0989 Other specified symptoms and signs involving the circulatory and respiratory systems: Secondary | ICD-10-CM

## 2013-07-03 DIAGNOSIS — Z87898 Personal history of other specified conditions: Secondary | ICD-10-CM

## 2013-07-09 ENCOUNTER — Ambulatory Visit
Admission: RE | Admit: 2013-07-09 | Discharge: 2013-07-09 | Disposition: A | Discharge status: Home / Self Care | Payer: Medicare Other | Source: Ambulatory Visit | Attending: Internal Medicine | Admitting: Internal Medicine

## 2013-07-09 DIAGNOSIS — R0989 Other specified symptoms and signs involving the circulatory and respiratory systems: Secondary | ICD-10-CM

## 2013-07-09 DIAGNOSIS — Z87898 Personal history of other specified conditions: Secondary | ICD-10-CM

## 2014-02-17 ENCOUNTER — Telehealth: Payer: Self-pay | Admitting: Hematology & Oncology

## 2014-02-17 NOTE — Telephone Encounter (Signed)
Spoke w NEW PATIENT today to remind them of their appointment with Dr. Ennever. Also, advised them to bring all meds and insurance information. ° °

## 2014-02-19 ENCOUNTER — Other Ambulatory Visit: Payer: Self-pay | Admitting: *Deleted

## 2014-02-19 ENCOUNTER — Encounter: Payer: Self-pay | Admitting: Hematology & Oncology

## 2014-02-19 ENCOUNTER — Ambulatory Visit (HOSPITAL_BASED_OUTPATIENT_CLINIC_OR_DEPARTMENT_OTHER): Payer: Commercial Managed Care - HMO | Admitting: Hematology & Oncology

## 2014-02-19 ENCOUNTER — Ambulatory Visit: Payer: Commercial Managed Care - HMO

## 2014-02-19 ENCOUNTER — Other Ambulatory Visit (HOSPITAL_BASED_OUTPATIENT_CLINIC_OR_DEPARTMENT_OTHER): Payer: Commercial Managed Care - HMO | Admitting: Lab

## 2014-02-19 VITALS — BP 150/66 | HR 81 | Temp 98.0°F | Resp 20 | Ht 67.0 in | Wt 163.0 lb

## 2014-02-19 DIAGNOSIS — R161 Splenomegaly, not elsewhere classified: Secondary | ICD-10-CM

## 2014-02-19 DIAGNOSIS — D696 Thrombocytopenia, unspecified: Secondary | ICD-10-CM

## 2014-02-19 LAB — CBC WITH DIFFERENTIAL (CANCER CENTER ONLY)
BASO#: 0 10*3/uL (ref 0.0–0.2)
BASO%: 0.2 % (ref 0.0–2.0)
EOS%: 1.2 % (ref 0.0–7.0)
Eosinophils Absolute: 0.1 10*3/uL (ref 0.0–0.5)
HCT: 38 % — ABNORMAL LOW (ref 38.7–49.9)
HEMOGLOBIN: 12.8 g/dL — AB (ref 13.0–17.1)
LYMPH#: 1 10*3/uL (ref 0.9–3.3)
LYMPH%: 25.9 % (ref 14.0–48.0)
MCH: 29.8 pg (ref 28.0–33.4)
MCHC: 33.7 g/dL (ref 32.0–35.9)
MCV: 89 fL (ref 82–98)
MONO#: 0.6 10*3/uL (ref 0.1–0.9)
MONO%: 14.7 % — AB (ref 0.0–13.0)
NEUT%: 58 % (ref 40.0–80.0)
NEUTROS ABS: 2.3 10*3/uL (ref 1.5–6.5)
PLATELETS: 119 10*3/uL — AB (ref 145–400)
RBC: 4.29 10*6/uL (ref 4.20–5.70)
RDW: 13.4 % (ref 11.1–15.7)
WBC: 4 10*3/uL (ref 4.0–10.0)

## 2014-02-19 LAB — CHCC SATELLITE - SMEAR

## 2014-02-19 NOTE — Progress Notes (Signed)
Referral MD  Reason for Referral: Splenomegaly and thrombocytopenia   Chief Complaint  Patient presents with  . Follow-up  : I am here because I have a big spleen  HPI: Mr.Avery is a very nice 68 year old gentleman. He has been seen in the past by one of our partners. He has chronic splenomegaly. He's been asymptomatic with this. He is a chronic thrombocytopenia. Again he's been asymptomatic.  He's had no bleeding. He's had no abdominal pain. He's had no fevers with chills. He's had no recurrent infections.  He says that a big spleen is in his family.  He's had no rashes. He's had no swollen glands.  He had an ultrasound of the abdomen last year. This showed mild but stable splenomegaly.  There's been no change in bowel or bladder habits. He's had no nausea or vomiting. He's had no cough. He's had no weight loss or weight gain.  Of note, is the fact that he had rheumatic fever as a child. He is blind in his left eye. He is hard of hearing. 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    Past Medical History  Diagnosis Date  . Hypertension   . Hyperlipidemia   . 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 Thyroid disease   . Allergy   . Colon polyp     2003 hyperplastic polyp Dr. Lajoyce Corners  :  Past Surgical History  Procedure Laterality Date  . Neck surgery      bone spurs  . Hernia repair      right inguinal  . Kidney stones      removed /1987  . Colonoscopy    . Polypectomy    :  Current outpatient prescriptions:amLODipine (NORVASC) 10 MG tablet, Take 10 mg by mouth daily., Disp: , Rfl: ;  aspirin EC 325 MG EC tablet, Take 1 tablet (325 mg total) by mouth daily., Disp: 30 tablet, Rfl: 0;  atorvastatin (LIPITOR) 10 MG tablet, Take 10 mg by mouth daily., Disp: , Rfl: ;  cetirizine (ZYRTEC) 10 MG tablet, Take 10 mg by mouth daily., Disp: , Rfl: ;  Cyanocobalamin (B-12) 1000 MCG SUBL, Place under the tongue  daily., Disp: , Rfl:  fenofibrate (TRICOR) 145 MG tablet, Take 160 mg by mouth daily. , Disp: , Rfl: ;  levothyroxine (SYNTHROID, LEVOTHROID) 112 MCG tablet, Take 112 mcg by mouth daily., Disp: , Rfl: ;  Multiple Vitamin (MULTIVITAMIN) tablet, Take 1 tablet by mouth daily., Disp: , Rfl: ;  Omega-3 Fatty Acids (FISH OIL) 1000 MG CAPS, Take 3,000 mg by mouth 2 (two) times daily., Disp: , Rfl:  promethazine (PHENERGAN) 25 MG tablet, Take 25 mg by mouth every 4 (four) hours as needed. For nausea, Disp: , Rfl: :  :  Allergies  Allergen Reactions  . Phenergan [Promethazine Hcl] Other (See Comments)    hallucinations  . Valium [Diazepam]   . Niacin And Related   . Azithromycin Rash  :  Family History  Problem Relation Age of Onset  . Diabetes Sister   :  History   Social History  . Marital Status: Single    Spouse Name: N/A    Number of Children: N/A  . Years of Education: N/A   Occupational History  . Not on file.   Social History Main Topics  . Smoking status: Never Smoker   . Smokeless tobacco: Never Used  .  Alcohol Use: No  . Drug Use: No  . Sexual Activity: Not on file   Other Topics Concern  . Not on file   Social History Narrative  . No narrative on file  :  Pertinent items are noted in HPI.  Exam: @IPVITALS @  well-developed well-nourished gentleman. Vital signs are stable. His blood pressure is 150/66. Pulse 81. Temperature 97.8. His weight is 163 pounds. His head exam shows no ocular or oral lesions. There is no scleral icterus. No adenopathy noted in his neck. Lungs are clear. Cardiac exam regular in rhythm. He has a 2/6 systolic ejection murmur. Abdomen is soft. Has good bowel sounds. There is no fluid wave. There is no palpable liver edge. I really cannot palpate his spleen tip. Back exam no tenderness over the spine. Extremities no clubbing cyanosis or edema. Skin exam no rashes. Neurological exam no focal neurological deficits.   Recent Labs  02/19/14 1219   WBC 4.0  HGB 12.8*  HCT 38.0*  PLT 119*   No results found for this basename: NA, K, CL, CO2, GLUCOSE, BUN, CREATININE, CALCIUM,  in the last 72 hours  Blood smear review: Normochromic normocytic red blood cells. There is no spherocytes. He has no schistocytes. No nucleated red cells are noted. There is no rouleau formation. White cells per normal in morphology maturation. No immature myeloid cells noted. No hyper segmented neutrophils.. No atypical lymphocytes are appreciated. Platelets are slightly decreased in number. They are well granulated. He has several large platelets. Pathology: None     Assessment and Plan: Mr. Biondolillo is a 68 year old gentleman. He has chronic splenomegaly. Again I really cannot palpate his spleen today. His last ultrasound showed mild splenomegaly.  With his last ultrasound, he had gallstones. With a family history of splenomegaly, one has to wonder about the possibility of hereditary spherocytosis. I will look back at his old record. I don't see any obvious indication for hemolysis.  I really do not think any further studies are needed. This issue is not new. He stable. He is asymptomatic. I don't see that we have to do a bone marrow test on him. I don't see were need to do any kind of genetic studies.  I wonder if him having his rheumatic fever may not be a factor with the splenomegaly.  He's very nice. I just don't think we have to see him back.  I spent a good 45 minutes with him. He is hard of hearing but that he could understand what I was saying.

## 2014-02-20 LAB — IRON AND TIBC CHCC
%SAT: 38 % (ref 20–55)
IRON: 125 ug/dL (ref 42–163)
TIBC: 326 ug/dL (ref 202–409)
UIBC: 201 ug/dL (ref 117–376)

## 2014-02-23 LAB — CARDIOLIPIN ANTIBODIES, IGG, IGM, IGA
ANTICARDIOLIPIN IGG: 1 GPL U/mL (ref <23)
ANTICARDIOLIPIN IGM: 0 [MPL'U]/mL (ref <11)
Anticardiolipin IgA: 6 APL U/mL (ref <22)

## 2014-02-23 LAB — COMPREHENSIVE METABOLIC PANEL
ALBUMIN: 4.8 g/dL (ref 3.5–5.2)
ALK PHOS: 37 U/L — AB (ref 39–117)
ALT: 18 U/L (ref 0–53)
AST: 33 U/L (ref 0–37)
BUN: 16 mg/dL (ref 6–23)
CHLORIDE: 104 meq/L (ref 96–112)
CO2: 24 mEq/L (ref 19–32)
CREATININE: 1.53 mg/dL — AB (ref 0.50–1.35)
Calcium: 9.9 mg/dL (ref 8.4–10.5)
Glucose, Bld: 93 mg/dL (ref 70–99)
POTASSIUM: 4.2 meq/L (ref 3.5–5.3)
Sodium: 141 mEq/L (ref 135–145)
Total Bilirubin: 0.5 mg/dL (ref 0.2–1.2)
Total Protein: 6.7 g/dL (ref 6.0–8.3)

## 2014-02-23 LAB — RETICULOCYTES (CHCC)
ABS Retic: 64.8 10*3/uL (ref 19.0–186.0)
RBC.: 4.32 MIL/uL (ref 4.22–5.81)
RETIC CT PCT: 1.5 % (ref 0.4–2.3)

## 2014-02-23 LAB — BETA 2 MICROGLOBULIN, SERUM: Beta-2 Microglobulin: 4.49 mg/L — ABNORMAL HIGH (ref <=2.51)

## 2014-02-23 LAB — ANA: Anti Nuclear Antibody(ANA): NEGATIVE

## 2014-12-04 HISTORY — PX: CATARACT EXTRACTION W/ INTRAOCULAR LENS  IMPLANT, BILATERAL: SHX1307

## 2015-02-08 DIAGNOSIS — E780 Pure hypercholesterolemia: Secondary | ICD-10-CM | POA: Diagnosis not present

## 2015-02-15 DIAGNOSIS — E039 Hypothyroidism, unspecified: Secondary | ICD-10-CM | POA: Diagnosis not present

## 2015-02-15 DIAGNOSIS — I10 Essential (primary) hypertension: Secondary | ICD-10-CM | POA: Diagnosis not present

## 2015-02-15 DIAGNOSIS — E780 Pure hypercholesterolemia: Secondary | ICD-10-CM | POA: Diagnosis not present

## 2015-04-20 DIAGNOSIS — M150 Primary generalized (osteo)arthritis: Secondary | ICD-10-CM | POA: Diagnosis not present

## 2015-04-20 DIAGNOSIS — M5136 Other intervertebral disc degeneration, lumbar region: Secondary | ICD-10-CM | POA: Diagnosis not present

## 2015-05-25 DIAGNOSIS — R269 Unspecified abnormalities of gait and mobility: Secondary | ICD-10-CM | POA: Diagnosis not present

## 2015-05-28 DIAGNOSIS — M25562 Pain in left knee: Secondary | ICD-10-CM | POA: Diagnosis not present

## 2015-05-28 DIAGNOSIS — R262 Difficulty in walking, not elsewhere classified: Secondary | ICD-10-CM | POA: Diagnosis not present

## 2015-05-28 DIAGNOSIS — M25561 Pain in right knee: Secondary | ICD-10-CM | POA: Diagnosis not present

## 2015-05-28 DIAGNOSIS — M6281 Muscle weakness (generalized): Secondary | ICD-10-CM | POA: Diagnosis not present

## 2015-05-31 DIAGNOSIS — M6281 Muscle weakness (generalized): Secondary | ICD-10-CM | POA: Diagnosis not present

## 2015-05-31 DIAGNOSIS — M25562 Pain in left knee: Secondary | ICD-10-CM | POA: Diagnosis not present

## 2015-05-31 DIAGNOSIS — M25561 Pain in right knee: Secondary | ICD-10-CM | POA: Diagnosis not present

## 2015-05-31 DIAGNOSIS — R262 Difficulty in walking, not elsewhere classified: Secondary | ICD-10-CM | POA: Diagnosis not present

## 2015-06-02 DIAGNOSIS — M25561 Pain in right knee: Secondary | ICD-10-CM | POA: Diagnosis not present

## 2015-06-02 DIAGNOSIS — M6281 Muscle weakness (generalized): Secondary | ICD-10-CM | POA: Diagnosis not present

## 2015-06-02 DIAGNOSIS — M25562 Pain in left knee: Secondary | ICD-10-CM | POA: Diagnosis not present

## 2015-06-02 DIAGNOSIS — R262 Difficulty in walking, not elsewhere classified: Secondary | ICD-10-CM | POA: Diagnosis not present

## 2015-06-04 DIAGNOSIS — R262 Difficulty in walking, not elsewhere classified: Secondary | ICD-10-CM | POA: Diagnosis not present

## 2015-06-04 DIAGNOSIS — M25562 Pain in left knee: Secondary | ICD-10-CM | POA: Diagnosis not present

## 2015-06-04 DIAGNOSIS — M25561 Pain in right knee: Secondary | ICD-10-CM | POA: Diagnosis not present

## 2015-06-04 DIAGNOSIS — M6281 Muscle weakness (generalized): Secondary | ICD-10-CM | POA: Diagnosis not present

## 2015-06-08 DIAGNOSIS — R262 Difficulty in walking, not elsewhere classified: Secondary | ICD-10-CM | POA: Diagnosis not present

## 2015-06-08 DIAGNOSIS — M6281 Muscle weakness (generalized): Secondary | ICD-10-CM | POA: Diagnosis not present

## 2015-06-08 DIAGNOSIS — M25562 Pain in left knee: Secondary | ICD-10-CM | POA: Diagnosis not present

## 2015-06-08 DIAGNOSIS — M25561 Pain in right knee: Secondary | ICD-10-CM | POA: Diagnosis not present

## 2015-06-10 DIAGNOSIS — M6281 Muscle weakness (generalized): Secondary | ICD-10-CM | POA: Diagnosis not present

## 2015-06-10 DIAGNOSIS — M25562 Pain in left knee: Secondary | ICD-10-CM | POA: Diagnosis not present

## 2015-06-10 DIAGNOSIS — M25561 Pain in right knee: Secondary | ICD-10-CM | POA: Diagnosis not present

## 2015-06-10 DIAGNOSIS — R262 Difficulty in walking, not elsewhere classified: Secondary | ICD-10-CM | POA: Diagnosis not present

## 2015-06-15 DIAGNOSIS — M25562 Pain in left knee: Secondary | ICD-10-CM | POA: Diagnosis not present

## 2015-06-15 DIAGNOSIS — M6281 Muscle weakness (generalized): Secondary | ICD-10-CM | POA: Diagnosis not present

## 2015-06-15 DIAGNOSIS — R262 Difficulty in walking, not elsewhere classified: Secondary | ICD-10-CM | POA: Diagnosis not present

## 2015-06-15 DIAGNOSIS — M25561 Pain in right knee: Secondary | ICD-10-CM | POA: Diagnosis not present

## 2015-06-17 DIAGNOSIS — M25561 Pain in right knee: Secondary | ICD-10-CM | POA: Diagnosis not present

## 2015-06-17 DIAGNOSIS — M6281 Muscle weakness (generalized): Secondary | ICD-10-CM | POA: Diagnosis not present

## 2015-06-17 DIAGNOSIS — R262 Difficulty in walking, not elsewhere classified: Secondary | ICD-10-CM | POA: Diagnosis not present

## 2015-06-17 DIAGNOSIS — M25562 Pain in left knee: Secondary | ICD-10-CM | POA: Diagnosis not present

## 2015-06-22 DIAGNOSIS — M6281 Muscle weakness (generalized): Secondary | ICD-10-CM | POA: Diagnosis not present

## 2015-06-22 DIAGNOSIS — R262 Difficulty in walking, not elsewhere classified: Secondary | ICD-10-CM | POA: Diagnosis not present

## 2015-06-22 DIAGNOSIS — M25561 Pain in right knee: Secondary | ICD-10-CM | POA: Diagnosis not present

## 2015-06-22 DIAGNOSIS — M25562 Pain in left knee: Secondary | ICD-10-CM | POA: Diagnosis not present

## 2015-06-24 DIAGNOSIS — R262 Difficulty in walking, not elsewhere classified: Secondary | ICD-10-CM | POA: Diagnosis not present

## 2015-06-24 DIAGNOSIS — N183 Chronic kidney disease, stage 3 (moderate): Secondary | ICD-10-CM | POA: Diagnosis not present

## 2015-06-24 DIAGNOSIS — G20 Parkinson's disease: Secondary | ICD-10-CM | POA: Diagnosis not present

## 2015-06-24 DIAGNOSIS — M25561 Pain in right knee: Secondary | ICD-10-CM | POA: Diagnosis not present

## 2015-06-24 DIAGNOSIS — M25562 Pain in left knee: Secondary | ICD-10-CM | POA: Diagnosis not present

## 2015-06-24 DIAGNOSIS — D61818 Other pancytopenia: Secondary | ICD-10-CM | POA: Diagnosis not present

## 2015-06-24 DIAGNOSIS — R269 Unspecified abnormalities of gait and mobility: Secondary | ICD-10-CM | POA: Diagnosis not present

## 2015-06-24 DIAGNOSIS — M6281 Muscle weakness (generalized): Secondary | ICD-10-CM | POA: Diagnosis not present

## 2015-06-28 DIAGNOSIS — N183 Chronic kidney disease, stage 3 (moderate): Secondary | ICD-10-CM | POA: Diagnosis not present

## 2015-06-28 DIAGNOSIS — G20 Parkinson's disease: Secondary | ICD-10-CM | POA: Diagnosis not present

## 2015-06-28 DIAGNOSIS — D61818 Other pancytopenia: Secondary | ICD-10-CM | POA: Diagnosis not present

## 2015-06-29 ENCOUNTER — Encounter: Payer: Self-pay | Admitting: Neurology

## 2015-06-29 ENCOUNTER — Ambulatory Visit (INDEPENDENT_AMBULATORY_CARE_PROVIDER_SITE_OTHER): Payer: Commercial Managed Care - HMO | Admitting: Neurology

## 2015-06-29 VITALS — BP 132/66 | HR 82 | Resp 16 | Ht 67.0 in | Wt 154.0 lb

## 2015-06-29 DIAGNOSIS — R29818 Other symptoms and signs involving the nervous system: Secondary | ICD-10-CM

## 2015-06-29 DIAGNOSIS — K5909 Other constipation: Secondary | ICD-10-CM | POA: Diagnosis not present

## 2015-06-29 DIAGNOSIS — G20 Parkinson's disease: Secondary | ICD-10-CM | POA: Diagnosis not present

## 2015-06-29 DIAGNOSIS — R2689 Other abnormalities of gait and mobility: Secondary | ICD-10-CM

## 2015-06-29 MED ORDER — CARBIDOPA-LEVODOPA 25-100 MG PO TABS: ORAL_TABLET | ORAL | Status: DC

## 2015-06-29 NOTE — Patient Instructions (Signed)
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  Let's try low dose Sinemet (generic name: carbidopa-levodopa) 25/100 mg: Take half a pill twice daily (8 AM and noon) for 2 weeks, then half a pill 3 times a day (8 AM, noon, and 4 PM) thereafter. Please try to take the medication away from you mealtimes, that is, ideally either one hour before or 2 hours after your meal to ensure optimal absorption. The medication can interfere with the protein content of your meal and trying to the protein in your food and therefore not get fully absorbed.  Common side effects reported are: Nausea, vomiting, sedation, confusion, lightheadedness. Rare side effects include hallucinations, severe nausea or vomiting, diarrhea and significant drop in blood pressure especially when going from lying to standing or from sitting to standing.

## 2015-06-29 NOTE — Progress Notes (Signed)
Subjective:    Patient ID: Kyle Espinoza is a 69 y.o. male.  HPI     Star Age, MD, PhD [facility] [address] [place], [place] [address]  Dear Dr. Shelia Media,   I saw your patient, Kyle Espinoza, upon your kind request, in my neurologic clinic today for initial consultation of his gait disorder. The patient is accompanied by his wife today. As you know, [name] is a 69 year old right-handed gentleman with an underlying medical history of thrombocytopenia, splenomegaly, hypertension, hyperlipidemia, hearing loss, thyroid disease, allergies, osteoarthritis, mild chronic kidney disease, status post neck surgery, kidney stone removal, and colonic polypectomy, who reports difficulty with his gait and balance. He feels like he is going to fall forward or backwards. Symptoms have been ongoing for about 2 months or a little longer. He denies tremors or lightheaded, no vertigo, no one sided weakness. 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 No acute intracranial abnormality.  No evidence of acute or chronic infarction. MRA HEAD: Negative.  He has not actually fallen, but feels like he could. He has been through PT outpatient and this has helped his walking. He has noted fine motor and coordination problems. He is slower and has had constipation for the past 6 months. He has mild forgetfulness, no FHx of PD or parkisonism.  He has not been driving very much and his wife has taken over for the most part, in the last 6-12 months. He sleeps fairly well. He does not drink enough water.   His Past Medical History Is Significant For: Past Medical History  Diagnosis Date  . Hypertension   . Hyperlipidemia   . 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 Thyroid disease   .  Allergy   . Colon polyp     2003 hyperplastic polyp Dr. Lajoyce Corners    His Past Surgical History Is Significant For: Past Surgical History  Procedure Laterality Date  . Neck surgery      bone spurs  . Hernia repair      right inguinal  . Kidney stones      removed /1987  . Colonoscopy    . Polypectomy      His Family History Is Significant For: Family History  Problem Relation Age of Onset  . Diabetes Sister     His Social History Is Significant For: History   Social History  . Marital Status: Single    Spouse Name: N/A  . Number of Children: 5  . Years of Education: 12   Occupational History  . Retired     Social History Main Topics  . Smoking status: Never Smoker   . Smokeless tobacco: Never Used  . Alcohol Use: No  . Drug Use: No  . Sexual Activity: Not on file   Other Topics Concern  . None   Social History Narrative   Denies caffeine use     His Allergies Are:  Allergies  Allergen Reactions  . Phenergan [Promethazine Hcl] Other (See Comments)    hallucinations  . Valium [Diazepam]   . Niacin And Related   . Azithromycin Rash  :   His Current Medications Are:  Outpatient Encounter Prescriptions as of 06/29/2015  Medication Sig  . amLODipine (NORVASC) 10 MG  tablet Take 10 mg by mouth daily.  Marland Kitchen aspirin EC 325 MG EC tablet Take 1 tablet (325 mg total) by mouth daily.  Marland Kitchen atorvastatin (LIPITOR) 10 MG tablet Take 10 mg by mouth daily.  . cetirizine (ZYRTEC) 10 MG tablet Take 10 mg by mouth daily.  . Cyanocobalamin (B-12) 1000 MCG SUBL Place under the tongue daily.  . fenofibrate (TRICOR) 145 MG tablet Take 160 mg by mouth daily.   Marland Kitchen levothyroxine (SYNTHROID, LEVOTHROID) 112 MCG tablet Take 112 mcg by mouth daily.  . Multiple Vitamin (MULTIVITAMIN) tablet Take 1 tablet by mouth daily.  . Omega-3 Fatty Acids (FISH OIL) 1000 MG CAPS Take 3,000 mg by mouth 2 (two) times daily.  . promethazine (PHENERGAN) 25 MG tablet Take 25 mg by mouth every 4 (four) hours as  needed. For nausea   No facility-administered encounter medications on file as of 06/29/2015.   Review of Systems:  Out of a complete 14 point review of systems, all are reviewed and negative with the exception of these symptoms as listed below:  Review of Systems  Neurological:       Patient feels unbalanced     Objective:  Neurologic Exam  Physical Exam Physical Examination:   Filed Vitals:   06/29/15 1406  BP: 132/66  Pulse: 82  Resp: 16   He has an 11 point drop in the systolic BP upon standing with no lightheadness reported.   General Examination: The patient is a very pleasant 69 y.o. male in no acute distress.  HEENT: Normocephalic, atraumatic, pupils are equal, round and reactive to light and accommodation. Funduscopic exam is normal with sharp disc margins noted. Extraocular tracking shows mild saccadic breakdown without nystagmus noted. There is limitation to upper gaze. There is mild decrease in eye blink rate. Hearing is impaired. Face is symmetric with mild facial masking and normal facial sensation. There is no lip, neck or jaw tremor. Neck is mild to moderately rigid with intact passive ROM. There are no carotid bruits on auscultation. Oropharynx exam reveals mild mouth dryness. No significant airway crowding is noted. Mallampati is class II. Tongue protrudes centrally and palate elevates symmetrically.   There is no drooling.   Chest: is clear to auscultation without wheezing, rhonchi or crackles noted.  Heart: sounds are regular and normal without murmurs, rubs or gallops noted.   Abdomen: is soft, non-tender and non-distended with normal bowel sounds appreciated on auscultation.  Extremities: There is no pitting edema in the distal lower extremities bilaterally. Pedal pulses are intact.   Skin: is warm and dry with no trophic changes noted.    Musculoskeletal: exam reveals no obvious joint deformities, tenderness, joint swelling or erythema.  Neurologically:   Mental status: The patient is awake and alert, paying good  attention. He is able to completely provide the history. His wife provides details. He is oriented to: person, place, time/date, situation, day of week, month of year and year. His memory, attention, language and knowledge are fairly well preserved. There is no aphasia, agnosia, apraxia or anomia. There is a mild degree of bradyphrenia. Speech is mildly hypophonic with mild dysarthria noted. Mood is congruent and affect is normal.  Cranial nerves are as described above under HEENT exam. In addition, shoulder shrug is normal with equal shoulder height noted.  Motor exam: Normal bulk, and strength for age is noted. Tone is mildly rigid in the upper extremities, no resting tremors noted, no postural or action tremor. Fine motor skills with finger taps  and hand movements are mildly impaired bilaterally. Rapid alternating movements are mildly impaired bilaterally. Foot agility and foot taps are mildly impaired bilaterally. He stands up from the seated position with mild difficulty. Romberg is negative. He does not feel lightheaded with standing. Reflexes are 1-2+ bilaterally. Toes are downgoing. Finger to nose testing is unremarkable. He does not have gait ataxia. He has a mildly stooped posture. He is gait is mildly impaired with decrease in arm swing bilaterally and mild shuffle, with mild decrease in stride length. He turns in 3 steps. Sensory exam is intact to light touch, pinprick, vibration, temperature sense and proprioception in the upper and lower extremities.   Assessment and Plan:    In summary, Tison Leibold Sainsbury is a very pleasant 69 y.o.-year old male with an underlying medical history of thrombocytopenia, splenomegaly, hypertension, hyperlipidemia, hearing loss, thyroid disease, allergies, osteoarthritis, mild chronic kidney disease, status post neck surgery, kidney stone removal, and colonic polypectomy, who reports difficulty with his gait  and balance, ongoing for the past 2 months or so. On examination, he has mild signs of parkinsonism without lateralization. Differential diagnosis includes Parkinson's disease versus atypical parkinsonism. Given his balance issues, one has to worry about atypical parkinsonism. I had a long discussion with the patient and his wife regarding my findings, his symptoms, and the diagnosis of parkinsonism/Parksinson's disease, its prognosis and treatment options. We talked about medical treatments and non-pharmacological approaches. We talked about maintaining a healthy lifestyle in general. I encouraged the patient to eat healthy, exercise daily and keep well hydrated, to keep a scheduled bedtime and wake time routine, to not skip any meals and eat healthy snacks in between meals and to have protein with every meal. In particular, I stressed the importance of regular exercise, within of course the patient's own mobility limitations.   As far as further diagnostic testing is concerned, I suggested: no change. We can consider brain imaging testing down the [place]. As far symptomatic treatment, I suggested a trial of low-dose Sinemet, starting with 25-100 milligrams strength, half a pill twice daily for 2 weeks then half a pill 3 times a day thereafter. We talked about potential side effects.I asked him to increase his water intake. We talked about expectations with a new medication. I answered all their questions today and the patient and his wife were in agreement. I would like to see him back in 3 months, sooner if the need arises. I encouraged him to call with any interim questions or concerns.I provided them with a new prescription for his new medication and written instructions with titration instructions.   Thank you very much for allowing me to participate in the care of this nice patient. If I can be of any further assistance to you please do not hesitate to call me at [phone].  Sincerely,   Star Age,  MD, PhD

## 2015-07-07 DIAGNOSIS — N183 Chronic kidney disease, stage 3 (moderate): Secondary | ICD-10-CM | POA: Diagnosis not present

## 2015-09-02 DIAGNOSIS — Z0000 Encounter for general adult medical examination without abnormal findings: Secondary | ICD-10-CM | POA: Diagnosis not present

## 2015-09-02 DIAGNOSIS — Z125 Encounter for screening for malignant neoplasm of prostate: Secondary | ICD-10-CM | POA: Diagnosis not present

## 2015-09-02 DIAGNOSIS — Z23 Encounter for immunization: Secondary | ICD-10-CM | POA: Diagnosis not present

## 2015-09-02 DIAGNOSIS — I10 Essential (primary) hypertension: Secondary | ICD-10-CM | POA: Diagnosis not present

## 2015-09-02 DIAGNOSIS — E780 Pure hypercholesterolemia: Secondary | ICD-10-CM | POA: Diagnosis not present

## 2015-09-02 DIAGNOSIS — E039 Hypothyroidism, unspecified: Secondary | ICD-10-CM | POA: Diagnosis not present

## 2015-09-09 DIAGNOSIS — G20 Parkinson's disease: Secondary | ICD-10-CM | POA: Diagnosis not present

## 2015-09-09 DIAGNOSIS — D692 Other nonthrombocytopenic purpura: Secondary | ICD-10-CM | POA: Diagnosis not present

## 2015-09-09 DIAGNOSIS — Z0000 Encounter for general adult medical examination without abnormal findings: Secondary | ICD-10-CM | POA: Diagnosis not present

## 2015-09-09 DIAGNOSIS — N183 Chronic kidney disease, stage 3 (moderate): Secondary | ICD-10-CM | POA: Diagnosis not present

## 2015-09-09 DIAGNOSIS — D61818 Other pancytopenia: Secondary | ICD-10-CM | POA: Diagnosis not present

## 2015-09-09 DIAGNOSIS — E7800 Pure hypercholesterolemia, unspecified: Secondary | ICD-10-CM | POA: Diagnosis not present

## 2015-09-09 DIAGNOSIS — R269 Unspecified abnormalities of gait and mobility: Secondary | ICD-10-CM | POA: Diagnosis not present

## 2015-09-09 DIAGNOSIS — B351 Tinea unguium: Secondary | ICD-10-CM | POA: Diagnosis not present

## 2015-09-29 ENCOUNTER — Encounter: Payer: Self-pay | Admitting: Neurology

## 2015-09-29 ENCOUNTER — Ambulatory Visit (INDEPENDENT_AMBULATORY_CARE_PROVIDER_SITE_OTHER): Payer: Commercial Managed Care - HMO | Admitting: Neurology

## 2015-09-29 VITALS — BP 132/68 | HR 70 | Resp 14 | Ht 67.0 in | Wt 152.0 lb

## 2015-09-29 DIAGNOSIS — G20 Parkinson's disease: Secondary | ICD-10-CM

## 2015-09-29 MED ORDER — CARBIDOPA-LEVODOPA 25-100 MG PO TABS: 1.0000 | ORAL_TABLET | Freq: Three times a day (TID) | ORAL | Status: DC

## 2015-09-29 NOTE — Progress Notes (Signed)
Subjective:    Patient ID: Kyle Espinoza is a 69 y.o. male.  HPI     Interim history:   Kyle Espinoza is a 69 year old right-handed gentleman with an underlying medical history of thrombocytopenia, splenomegaly, hypertension, hyperlipidemia, hearing loss, thyroid disease, allergies, osteoarthritis, mild chronic kidney disease, status post neck surgery, kidney stone removal, and colonic polypectomy, who presents for follow-up consultation of his gait disorder and parkinsonism. The patient is accompanied by his wife again today. I first met him on 06/29/2015 at the request of his primary care physician, at which time the patient reported difficulty with his gait and balance as well as a tendency to fall forward for at least 2 months. On examination, I felt he had mild signs of parkinsonism without overt lateralization. I suggested a trial of low-dose Sinemet starting with half pill twice daily and titration to have a pill 3 times a day after that. We considered a brain scan down the [place].  Today, 09/29/2015: He reports doing about the same, per wife, since he has been using his cane more consistently. Inside the house he does not use the cane. As far as the Sinemet goes, he takes half a pill 3 times a day and seems to tolerate it well. He has not noticed much in the way of improvement since taking it. He has had intermittent nausea in the afternoon, he usually does not eat a fall evening meal but eats a snack. He may not be drinking enough water. He averages about 2 bottles per day per wife. For constipation he takes a stool softener. He is active physically.  06/29/2015:  He reports difficulty with his gait and balance. He feels like he is going to fall forward or backwards. Symptoms have been ongoing for about 2 months or a little longer. He denies tremors or lightheaded, no vertigo, no one sided weakness. 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 No acute intracranial abnormality.  No evidence of acute or chronic infarction. MRA HEAD: Negative.   He has not actually fallen, but feels like he could. He has been through PT outpatient and this has helped his walking. He has noted fine motor and coordination problems. He is slower and has had constipation for the past 6 months. He has mild forgetfulness, no FHx of PD or parkisonism.  He has not been driving very much and his wife has taken over for the most part, in the last 6-12 months. He sleeps fairly well. He does not drink enough water.  His Past Medical History Is Significant For: Past Medical History  Diagnosis Date  . Hypertension   . Hyperlipidemia   . 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 Thyroid disease   . Allergy   . Colon polyp     2003 hyperplastic polyp Dr. Lajoyce Corners    His Past Surgical History Is Significant For: Past Surgical History  Procedure Laterality Date  . Neck surgery      bone spurs  . Hernia repair      right inguinal  . Kidney stones      removed /1987  . Colonoscopy    . Polypectomy      His Family History Is Significant For: Family History  Problem Relation Age of Onset  .  Diabetes Sister     His Social History Is Significant For: Social History   Social History  . Marital Status: Single    Spouse Name: N/A  . Number of Children: 5  . Years of Education: 12   Occupational History  . Retired     Social History Main Topics  . Smoking status: Never Smoker   . Smokeless tobacco: Never Used  . Alcohol Use: No  . Drug Use: No  . Sexual Activity: Not Asked   Other Topics Concern  . None   Social History Narrative   Denies caffeine use     His Allergies Are:  Allergies  Allergen Reactions  . Phenergan [Promethazine Hcl] Other (See Comments)    hallucinations  . Valium [Diazepam]   .  Niacin And Related   . Azithromycin Rash  :   His Current Medications Are:  Outpatient Encounter Prescriptions as of 09/29/2015  Medication Sig  . amLODipine (NORVASC) 10 MG tablet Take 10 mg by mouth daily.  Marland Kitchen aspirin EC 325 MG EC tablet Take 1 tablet (325 mg total) by mouth daily.  Marland Kitchen atorvastatin (LIPITOR) 10 MG tablet Take 10 mg by mouth daily.  . carbidopa-levodopa (SINEMET IR) 25-100 MG per tablet Take 1/2 pill twice daily x 2 weeks, then 1/2 pill 3 times a day thereafter.  . Cyanocobalamin (B-12) 1000 MCG SUBL Place under the tongue daily.  Marland Kitchen levothyroxine (SYNTHROID, LEVOTHROID) 112 MCG tablet Take 112 mcg by mouth daily.  . Multiple Vitamin (MULTIVITAMIN) tablet Take 1 tablet by mouth daily.  . Omega-3 Fatty Acids (FISH OIL) 1000 MG CAPS Take 3,000 mg by mouth 2 (two) times daily.  . [DISCONTINUED] cetirizine (ZYRTEC) 10 MG tablet Take 10 mg by mouth daily.  . [DISCONTINUED] fenofibrate (TRICOR) 145 MG tablet Take 160 mg by mouth daily.   . [DISCONTINUED] promethazine (PHENERGAN) 25 MG tablet Take 25 mg by mouth every 4 (four) hours as needed. For nausea   No facility-administered encounter medications on file as of 09/29/2015.  :  Review of Systems:  Out of a complete 14 point review of systems, all are reviewed and negative with the exception of these symptoms as listed below:   Review of Systems  Neurological:       Patient states that he has no new concerns. Take 1/2 Sinemet 25-16m three times a day.     Objective:  Neurologic Exam  Physical Exam Physical Examination:   Filed Vitals:   09/29/15 1021  BP: 132/68  Pulse: 70  Resp: 14   General Examination: The patient is a very pleasant 69y.o. male in no acute distress. He is in good spirits today.  HEENT: Normocephalic, atraumatic, pupils are equal, round and reactive to light and accommodation. Extraocular tracking shows mild saccadic breakdown without nystagmus noted. There is limitation to upper gaze.  There is mild decrease in eye blink rate. Hearing is impaired. He has bilateral hearing aids. Face is symmetric with mild facial masking and normal facial sensation. There is no lip, neck or jaw tremor. Neck is mild to moderately rigid with intact passive ROM. There are no carotid bruits on auscultation. Oropharynx exam reveals mild mouth dryness. No significant airway crowding is noted. Mallampati is class II. Tongue protrudes centrally and palate elevates symmetrically.   There is no drooling.   Chest: is clear to auscultation without wheezing, rhonchi or crackles noted.  Heart: sounds are regular and normal without murmurs, rubs or gallops noted.   Abdomen: is  soft, non-tender and non-distended with normal bowel sounds appreciated on auscultation.  Extremities: There is no pitting edema in the distal lower extremities bilaterally. Pedal pulses are intact.   Skin: is warm and dry with no trophic changes noted.    Musculoskeletal: exam reveals no obvious joint deformities, tenderness, joint swelling or erythema.  Neurologically:  Mental status: The patient is awake and alert, paying good  attention. He is able to completely provide the history. His wife provides details. He is oriented to: person, place, time/date, situation, day of week, month of year and year. His memory, attention, language and knowledge are fairly well preserved. There is no aphasia, agnosia, apraxia or anomia. There is a mild degree of bradyphrenia. Speech is mildly hypophonic with mild dysarthria noted. Mood is congruent and affect is normal.  Cranial nerves are as described above under HEENT exam. In addition, shoulder shrug is normal with equal shoulder height noted.  Motor exam: Normal bulk, and strength for age is noted. Tone is mildly rigid in the upper extremities, no resting tremor is noted, no postural or action tremor. Fine motor skills with finger taps and hand movements are mildly impaired bilaterally. Rapid  alternating movements are mildly impaired bilaterally. Foot agility and foot taps are mildly impaired bilaterally. He stands up from the seated position with mild difficulty. Romberg is negative. He does not feel lightheaded with standing. Reflexes are 1-2+ bilaterally. Finger to nose testing is unremarkable. He does not have gait ataxia. He has a mildly stooped posture. His gait is mildly impaired with decrease in arm swing bilaterally and mild shuffle, with mild decrease in stride length. He turns in 3 steps. He uses his single-point cane on the right side. Sensory exam is intact to light touch.   Assessment and Plan:    In summary, Kyle Espinoza is a very pleasant 69 y.o.-year old male with an underlying medical history of thrombocytopenia, splenomegaly, hypertension, hyperlipidemia, hearing loss, thyroid disease, allergies, osteoarthritis, mild chronic kidney disease, status post neck surgery, kidney stone removal, and colonic polypectomy, who presents for follow-up consultation of his gait disorder and parkinsonism. He has been able to tolerate Sinemet half a pill 3 times a day. 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 Differential diagnosis includes Parkinson's disease versus atypical parkinsonism. Given his balance issues, one has to worry about atypical parkinsonism, such as PSP. We again talked about medical treatments and non-pharmacological approaches. We talked about maintaining a healthy lifestyle in general. I encouraged the patient to eat healthy, exercise daily and keep well hydrated, to keep a scheduled bedtime and wake time routine, to not skip any meals and eat healthy snacks in between meals and to have protein with every meal. In particular, I stressed the importance of regular exercise, within of course the patient's own mobility limitations.   As far as further diagnostic testing is concerned, I suggested: no change. We can consider brain imaging testing down the [place]. As  far symptomatic treatment, I suggested we increase Sinemet to 1 pill 3 times a day. He currently takes it at 6 AM, 11:45 AM and around 6 PM, usually one hour before his meal times. He is advised to continue with the timing. I would like to see him back in about 4-6 months, sooner if needed. He would like to make an appointment for 6 months, and he promised to call if he had any questions or concerns.  I adjusted his Sinemet prescription today. I answered all their questions today and  the patient and his wife were in agreement. I spent 25 minutes in total face-to-face time with the patient, more than 50% of which was spent in counseling and coordination of care, reviewing test results, reviewing medication and discussing or reviewing the diagnosis of parkinsonism, its prognosis and treatment options.

## 2015-09-29 NOTE — Patient Instructions (Signed)
I think your exam is a little better, which is reassuring. Overall you are doing fairly well but I do want to suggest a few things today:  Remember to drink plenty of fluid at least 6 glasses (8 oz each), eat healthy meals and do not skip any meals. Try to eat protein with a every meal and eat a healthy snack such as fruit or nuts in between meals. Try to keep a regular sleep-wake schedule and try to exercise daily, particularly in the form of walking, 20-30 minutes a day, if you can. Use your cane at all time.   Taking your medication on schedule is key.   Try to stay active physically and mentally. Engage in social activities in your community and with your family and try to keep up with current events by reading the newspaper or watching the news. Try to do word puzzles and you may like to do puzzles and brain games on the computer such as on [email or web address]/.   As far as your medications are concerned, I would like to suggest that you take your current medication with the following additional changes: Let's increase your sinemet to 1 pill 3 times a day, 6 AM, 1 PM and 6 PM.     I would like to see you back in 6 months, sooner if we need to. Please call us with any interim questions, concerns, problems, updates or refill requests.  Our phone number is [phone]. We also have an after hours call service for urgent matters and there is a physician on-call for urgent questions, that cannot wait till the next work day. For any emergencies you know to call 911 or go to the nearest emergency room.   You can email me through my chart and also leave a phone message for Beverlee Nims, my nurse.

## 2015-10-11 DIAGNOSIS — H02834 Dermatochalasis of left upper eyelid: Secondary | ICD-10-CM | POA: Diagnosis not present

## 2015-10-11 DIAGNOSIS — H25813 Combined forms of age-related cataract, bilateral: Secondary | ICD-10-CM | POA: Diagnosis not present

## 2015-10-11 DIAGNOSIS — H527 Unspecified disorder of refraction: Secondary | ICD-10-CM | POA: Diagnosis not present

## 2015-10-11 DIAGNOSIS — H53002 Unspecified amblyopia, left eye: Secondary | ICD-10-CM | POA: Diagnosis not present

## 2015-10-11 DIAGNOSIS — H02831 Dermatochalasis of right upper eyelid: Secondary | ICD-10-CM | POA: Diagnosis not present

## 2015-10-20 DIAGNOSIS — H53002 Unspecified amblyopia, left eye: Secondary | ICD-10-CM | POA: Diagnosis not present

## 2015-10-20 DIAGNOSIS — H02834 Dermatochalasis of left upper eyelid: Secondary | ICD-10-CM | POA: Diagnosis not present

## 2015-10-20 DIAGNOSIS — H25813 Combined forms of age-related cataract, bilateral: Secondary | ICD-10-CM | POA: Diagnosis not present

## 2015-10-20 DIAGNOSIS — H02831 Dermatochalasis of right upper eyelid: Secondary | ICD-10-CM | POA: Diagnosis not present

## 2015-10-20 DIAGNOSIS — H527 Unspecified disorder of refraction: Secondary | ICD-10-CM | POA: Diagnosis not present

## 2015-10-26 DIAGNOSIS — Z87442 Personal history of urinary calculi: Secondary | ICD-10-CM | POA: Diagnosis not present

## 2015-10-26 DIAGNOSIS — H2511 Age-related nuclear cataract, right eye: Secondary | ICD-10-CM | POA: Diagnosis not present

## 2015-10-26 DIAGNOSIS — E039 Hypothyroidism, unspecified: Secondary | ICD-10-CM | POA: Diagnosis not present

## 2015-10-26 DIAGNOSIS — Z888 Allergy status to other drugs, medicaments and biological substances status: Secondary | ICD-10-CM | POA: Diagnosis not present

## 2015-10-26 DIAGNOSIS — G20 Parkinson's disease: Secondary | ICD-10-CM | POA: Diagnosis not present

## 2015-10-26 DIAGNOSIS — E785 Hyperlipidemia, unspecified: Secondary | ICD-10-CM | POA: Diagnosis not present

## 2015-10-26 DIAGNOSIS — H25811 Combined forms of age-related cataract, right eye: Secondary | ICD-10-CM | POA: Diagnosis not present

## 2015-10-26 DIAGNOSIS — R011 Cardiac murmur, unspecified: Secondary | ICD-10-CM | POA: Diagnosis not present

## 2015-10-26 DIAGNOSIS — I10 Essential (primary) hypertension: Secondary | ICD-10-CM | POA: Diagnosis not present

## 2015-10-26 DIAGNOSIS — Z961 Presence of intraocular lens: Secondary | ICD-10-CM | POA: Diagnosis not present

## 2015-10-26 DIAGNOSIS — Z79899 Other long term (current) drug therapy: Secondary | ICD-10-CM | POA: Diagnosis not present

## 2015-11-04 DIAGNOSIS — G20 Parkinson's disease: Secondary | ICD-10-CM | POA: Diagnosis not present

## 2015-11-04 DIAGNOSIS — H25812 Combined forms of age-related cataract, left eye: Secondary | ICD-10-CM | POA: Insufficient documentation

## 2015-11-04 DIAGNOSIS — Z961 Presence of intraocular lens: Secondary | ICD-10-CM | POA: Diagnosis not present

## 2015-11-04 DIAGNOSIS — I10 Essential (primary) hypertension: Secondary | ICD-10-CM | POA: Diagnosis not present

## 2015-11-04 DIAGNOSIS — M1990 Unspecified osteoarthritis, unspecified site: Secondary | ICD-10-CM | POA: Diagnosis not present

## 2015-11-04 DIAGNOSIS — H2512 Age-related nuclear cataract, left eye: Secondary | ICD-10-CM | POA: Diagnosis not present

## 2015-11-04 DIAGNOSIS — E039 Hypothyroidism, unspecified: Secondary | ICD-10-CM | POA: Diagnosis not present

## 2015-11-04 DIAGNOSIS — E785 Hyperlipidemia, unspecified: Secondary | ICD-10-CM | POA: Diagnosis not present

## 2015-11-10 DIAGNOSIS — Z0100 Encounter for examination of eyes and vision without abnormal findings: Secondary | ICD-10-CM | POA: Diagnosis not present

## 2015-11-10 DIAGNOSIS — H5213 Myopia, bilateral: Secondary | ICD-10-CM | POA: Diagnosis not present

## 2015-11-10 DIAGNOSIS — H524 Presbyopia: Secondary | ICD-10-CM | POA: Diagnosis not present

## 2015-11-10 DIAGNOSIS — H52209 Unspecified astigmatism, unspecified eye: Secondary | ICD-10-CM | POA: Diagnosis not present

## 2015-12-09 DIAGNOSIS — E7800 Pure hypercholesterolemia, unspecified: Secondary | ICD-10-CM | POA: Diagnosis not present

## 2015-12-09 DIAGNOSIS — I10 Essential (primary) hypertension: Secondary | ICD-10-CM | POA: Diagnosis not present

## 2015-12-09 DIAGNOSIS — E039 Hypothyroidism, unspecified: Secondary | ICD-10-CM | POA: Diagnosis not present

## 2015-12-09 DIAGNOSIS — R7303 Prediabetes: Secondary | ICD-10-CM | POA: Diagnosis not present

## 2015-12-16 DIAGNOSIS — E7800 Pure hypercholesterolemia, unspecified: Secondary | ICD-10-CM | POA: Diagnosis not present

## 2015-12-16 DIAGNOSIS — B351 Tinea unguium: Secondary | ICD-10-CM | POA: Diagnosis not present

## 2015-12-16 DIAGNOSIS — I10 Essential (primary) hypertension: Secondary | ICD-10-CM | POA: Diagnosis not present

## 2015-12-16 DIAGNOSIS — E039 Hypothyroidism, unspecified: Secondary | ICD-10-CM | POA: Diagnosis not present

## 2016-02-16 DIAGNOSIS — E039 Hypothyroidism, unspecified: Secondary | ICD-10-CM | POA: Diagnosis not present

## 2016-02-23 DIAGNOSIS — I10 Essential (primary) hypertension: Secondary | ICD-10-CM | POA: Diagnosis not present

## 2016-02-23 DIAGNOSIS — E039 Hypothyroidism, unspecified: Secondary | ICD-10-CM | POA: Diagnosis not present

## 2016-03-29 ENCOUNTER — Encounter: Payer: Self-pay | Admitting: Neurology

## 2016-03-29 ENCOUNTER — Ambulatory Visit (INDEPENDENT_AMBULATORY_CARE_PROVIDER_SITE_OTHER): Payer: Commercial Managed Care - HMO | Admitting: Neurology

## 2016-03-29 VITALS — BP 140/62 | HR 76 | Resp 16 | Ht 67.0 in | Wt 144.0 lb

## 2016-03-29 DIAGNOSIS — G20 Parkinson's disease: Secondary | ICD-10-CM

## 2016-03-29 NOTE — Patient Instructions (Signed)
Let's try to taper you off of the Sinemet, as you did not feel an improvement on the medication. We may reconsider it down the road again. Therefore, go down to 1/2 pill 3 times a day for 3 days, then stop it altogether.   We will do a brain scan, called MRI and call you with the test results. We will have to schedule you for this on a separate date. This test requires authorization from your insurance, and we will take care of the insurance process.  I will see you back in 6 months for a recheck.

## 2016-03-29 NOTE — Progress Notes (Signed)
Subjective:    Patient ID: Kyle Espinoza is a 70 y.o. male.  HPI     Interim history:   Kyle Espinoza is a 70 year old right-handed gentleman with an underlying medical history of thrombocytopenia, splenomegaly, hypertension, hyperlipidemia, hearing loss, thyroid disease, allergies, osteoarthritis, mild chronic kidney disease, status post neck surgery, kidney stone removal, and colonic polypectomy, who presents for follow-up consultation of his gait disorder and parkinsonism. The patient is accompanied by his wife again today. I last saw him on 09/29/2015, at which time he reported doing about the same. Per wife, he was using his cane more consistently. Inside the house he does not use the cane. As far as the Sinemet goes, he was on half a pill 3 times a day and was tolerating it well. He had not noticed much in the way of improvement since taking it. He had intermittent nausea in the afternoon, he would not eat a full evening meal but eats a snack. He was not drinking enough water. For constipation he was on a stool softener. He is active physically. I asked him to increase the Sinemet to 1 pill tid.   Today, 03/29/2016: He reports doing about the same. He is able to tolerate the Sinemet at the current dose which is 1 pill 3 times a day but has noted no difference. If anything using the cane consistently has helped his balance and insecurity while walking. He only uses it typically outside especially when he knows he is going to walk a distance or when there is uneven ground. Thankfully, he has not fallen. He has noted no new symptoms, in particular no episodic confusion, no one-sided weakness, numbness or slurring of speech, no headaches. His wife also has noticed no changes in his walk or slowness after we increase the Sinemet.  Previously:  I first met him on 06/29/2015 at the request of his primary care physician, at which time the patient reported difficulty with his gait and balance as well as a  tendency to fall forward for at least 2 months. On examination, I felt he had mild signs of parkinsonism without overt lateralization. I suggested a trial of low-dose Sinemet starting with half pill twice daily and titration to have a pill 3 times a day after that. We considered a brain scan down the Road.  06/29/2015:  He reports difficulty with his gait and balance. He feels like he is going to fall forward or backwards. Symptoms have been ongoing for about 2 months or a little longer. He denies tremors or lightheaded, no vertigo, no one sided weakness. 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 No acute intracranial abnormality.  No evidence of acute or chronic infarction. MRA HEAD: Negative.   He has not actually fallen, but feels like he could. He has been through PT outpatient and this has helped his walking. He has noted fine motor and coordination problems. He is slower and has had constipation for the past 6 months. He has mild forgetfulness, no FHx of PD or parkisonism.   He has not been driving very much and his wife has taken over for the most part, in the last 6-12 months. He sleeps fairly well. He does not drink enough water.  His Past Medical History Is Significant For: Past Medical History  Diagnosis Date  .  Hypertension   . Hyperlipidemia   . 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 Thyroid disease   . Allergy   . Colon polyp     2003 hyperplastic polyp Dr. Lajoyce Corners    His Past Surgical History Is Significant For: Past Surgical History  Procedure Laterality Date  . Neck surgery      bone spurs  . Hernia repair      right inguinal  . Kidney stones      removed /1987  . Colonoscopy    . Polypectomy      His Family History Is Significant For: Family History  Problem Relation Age  of Onset  . Diabetes Sister     His Social History Is Significant For: Social History   Social History  . Marital Status: Single    Spouse Name: N/A  . Number of Children: 5  . Years of Education: 12   Occupational History  . Retired     Social History Main Topics  . Smoking status: Never Smoker   . Smokeless tobacco: Never Used  . Alcohol Use: No  . Drug Use: No  . Sexual Activity: Not Asked   Other Topics Concern  . None   Social History Narrative   Denies caffeine use     His Allergies Are:  Allergies  Allergen Reactions  . Phenergan [Promethazine Hcl] Other (See Comments)    hallucinations  . Valium [Diazepam]   . Niacin And Related   . Azithromycin Rash  :   His Current Medications Are:  Outpatient Encounter Prescriptions as of 03/29/2016  Medication Sig  . amLODipine (NORVASC) 10 MG tablet Take 10 mg by mouth daily.  Marland Kitchen aspirin EC 325 MG EC tablet Take 1 tablet (325 mg total) by mouth daily.  Marland Kitchen atorvastatin (LIPITOR) 10 MG tablet Take 10 mg by mouth daily.  . carbidopa-levodopa (SINEMET IR) 25-100 MG tablet Take 1 tablet by mouth 3 (three) times daily.  . Cyanocobalamin (B-12) 1000 MCG SUBL Place under the tongue daily.  Marland Kitchen levothyroxine (SYNTHROID, LEVOTHROID) 112 MCG tablet Take 112 mcg by mouth daily.  . Multiple Vitamin (MULTIVITAMIN) tablet Take 1 tablet by mouth daily.  . Omega-3 Fatty Acids (FISH OIL) 1000 MG CAPS Take 3,000 mg by mouth 2 (two) times daily.   No facility-administered encounter medications on file as of 03/29/2016.  :  Review of Systems:  Out of a complete 14 point review of systems, all are reviewed and negative with the exception of these symptoms as listed below:   Review of Systems  Neurological:       No new concerns per patient.     Objective:  Neurologic Exam  Physical Exam Physical Examination:   Filed Vitals:   03/29/16 0958  BP: 140/62  Pulse: 76  Resp: 16   General Examination: The patient is a very  pleasant 70 y.o. male in no acute distress. He is in good spirits today, no new issues.  HEENT: Normocephalic, atraumatic, pupils are equal, round and reactive to light and accommodation. Extraocular tracking shows mild saccadic breakdown without nystagmus noted. There is limitation to upper gaze. There is mild decrease in eye blink rate. Hearing is impaired and he has bilateral hearing aids. Face is symmetric with mild facial masking and normal facial sensation. There is no lip, neck or jaw tremor. Neck is mild to moderately rigid with intact passive ROM. There are no carotid bruits on auscultation. Oropharynx exam reveals  mild mouth dryness. No significant airway crowding is noted. Mallampati is class II. Tongue protrudes centrally and palate elevates symmetrically. There is no drooling.   Chest: is clear to auscultation without wheezing, rhonchi or crackles noted.  Heart: sounds are regular and normal without murmurs, rubs or gallops noted.   Abdomen: is soft, non-tender and non-distended with normal bowel sounds appreciated on auscultation.  Extremities: There is no pitting edema in the distal lower extremities bilaterally. Pedal pulses are intact.   Skin: is warm and dry with no trophic changes noted.    Musculoskeletal: exam reveals no obvious joint deformities, tenderness, joint swelling or erythema.  Neurologically:  Mental status: The patient is awake and alert, paying good  attention. He is able to completely provide the history. His wife provides details. He is oriented to: person, place, time/date, situation, day of week, month of year and year. His memory, attention, language and knowledge are fairly well preserved. There is no aphasia, agnosia, apraxia or anomia. There is a mild degree of bradyphrenia. Speech is mildly hypophonic with mild dysarthria noted. Mood is congruent and affect is normal.  Cranial nerves are as described above under HEENT exam. In addition, shoulder shrug is  normal with equal shoulder height noted.  Motor exam: Normal bulk, and strength for age is noted. Tone is mildly rigid in the upper extremities, no resting tremor is noted, no postural or action tremor, all unchanged. Fine motor skills with finger taps and hand movements are mildly impaired bilaterally. Rapid alternating movements are mildly impaired bilaterally. Foot agility and foot taps are mildly impaired bilaterally. He stands up from the seated position with mild difficulty. Romberg is negative. He does not feel lightheaded with standing, no vertigo. Reflexes are 1-2+ bilaterally. Finger to nose testing is unremarkable. He does not have gait ataxia. He has a mildly stooped posture. His gait is mildly impaired with decrease in arm swing bilaterally and mild shuffle, with mild decrease in stride length, all stable. He turns in 3 steps, with slight insecurity noted with time. He uses his single-point cane on the right side. Sensory exam is intact to light touch throughout.   Assessment and Plan:   In summary, Norton Bivins Haralson is a very pleasant 70 year old male with an underlying medical history of thrombocytopenia, splenomegaly, hypertension, hyperlipidemia, hearing loss, thyroid disease, allergies, osteoarthritis, mild chronic kidney disease, status post neck surgery, kidney stone removal, and colonic polypectomy, who presents for follow-up consultation of his gait disorder and mild parkinsonism. He has been able to tolerate Sinemet at one pill 3 times a daybut has had no telltale response on it. He has mild evidence of parkinsonism without overt lateralization and his exam is stable from last time. Differential diagnosis does include Parkinson's disease versus parkinsonism, perhaps worrisome for atypical parkinsonism, this may include PSP. Nevertheless, at this time, we mutually agreed to taper him off of Sinemet by reducing it to half a pill 3 times a day for the next 3 days and then he can stop it. We can  always reconsider this down the road. In addition, I suggested we do a brain MRI for comparison. Last MRI was a little over 4 years ago. We again talked about maintaining a healthy lifestyle in general. I encouraged the patient to eat healthy, exercise daily and keep well hydrated, to keep a scheduled bedtime and wake time routine, to not skip any meals and eat healthy snacks in between meals and to have protein with every meal. In  particular, I stressed the importance of regular exercise, within of course the patient's own mobility limitations. He is advised to use his cane consistently, especially when he goes outside. I would like to see him back in 6 months for a recheck, sooner if needed. They are encouraged to call with any interim questions or concerns and we will be in touch over the phone regarding his MRI results when we have results available. I answered all her questions today and the patient and his wife were in agreement. I spent 25 minutes in total face-to-face time with the patient, more than 50% of which was spent in counseling and coordination of care, reviewing test results, reviewing medication and discussing or reviewing the diagnosis of parkinsonism, its prognosis and treatment options.

## 2016-07-06 DIAGNOSIS — R319 Hematuria, unspecified: Secondary | ICD-10-CM | POA: Diagnosis not present

## 2016-07-10 ENCOUNTER — Ambulatory Visit
Admission: RE | Admit: 2016-07-10 | Discharge: 2016-07-10 | Disposition: A | Discharge status: Home / Self Care | Payer: Commercial Managed Care - HMO | Source: Ambulatory Visit | Attending: Internal Medicine | Admitting: Internal Medicine

## 2016-07-10 ENCOUNTER — Other Ambulatory Visit: Payer: Self-pay | Admitting: Internal Medicine

## 2016-07-10 DIAGNOSIS — R319 Hematuria, unspecified: Secondary | ICD-10-CM

## 2016-07-10 DIAGNOSIS — N189 Chronic kidney disease, unspecified: Secondary | ICD-10-CM | POA: Diagnosis not present

## 2016-07-10 DIAGNOSIS — N390 Urinary tract infection, site not specified: Secondary | ICD-10-CM | POA: Diagnosis not present

## 2016-07-12 ENCOUNTER — Encounter (HOSPITAL_COMMUNITY): Payer: Self-pay | Admitting: Emergency Medicine

## 2016-07-12 ENCOUNTER — Emergency Department (HOSPITAL_COMMUNITY)
Admission: EM | Admit: 2016-07-12 | Discharge: 2016-07-12 | Disposition: A | Discharge status: Home / Self Care | Payer: Commercial Managed Care - HMO | Attending: Emergency Medicine | Admitting: Emergency Medicine

## 2016-07-12 DIAGNOSIS — Z8673 Personal history of transient ischemic attack (TIA), and cerebral infarction without residual deficits: Secondary | ICD-10-CM | POA: Insufficient documentation

## 2016-07-12 DIAGNOSIS — N390 Urinary tract infection, site not specified: Secondary | ICD-10-CM | POA: Insufficient documentation

## 2016-07-12 DIAGNOSIS — I10 Essential (primary) hypertension: Secondary | ICD-10-CM | POA: Diagnosis not present

## 2016-07-12 DIAGNOSIS — Z7982 Long term (current) use of aspirin: Secondary | ICD-10-CM | POA: Diagnosis not present

## 2016-07-12 DIAGNOSIS — R319 Hematuria, unspecified: Secondary | ICD-10-CM | POA: Diagnosis present

## 2016-07-12 LAB — COMPREHENSIVE METABOLIC PANEL
ALBUMIN: 4.2 g/dL (ref 3.5–5.0)
ALT: 5 U/L — AB (ref 17–63)
AST: 36 U/L (ref 15–41)
Alkaline Phosphatase: 51 U/L (ref 38–126)
Anion gap: 7 (ref 5–15)
BILIRUBIN TOTAL: 0.5 mg/dL (ref 0.3–1.2)
BUN: 10 mg/dL (ref 6–20)
CO2: 24 mmol/L (ref 22–32)
CREATININE: 1.33 mg/dL — AB (ref 0.61–1.24)
Calcium: 9.6 mg/dL (ref 8.9–10.3)
Chloride: 105 mmol/L (ref 101–111)
GFR calc Af Amer: >60 mL/min (ref >60)
GFR calc non Af Amer: 52 mL/min — ABNORMAL LOW (ref >60)
GLUCOSE: 111 mg/dL — AB (ref 65–99)
POTASSIUM: 4.2 mmol/L (ref 3.5–5.1)
Sodium: 136 mmol/L (ref 135–145)
TOTAL PROTEIN: 6.2 g/dL — AB (ref 6.5–8.1)

## 2016-07-12 LAB — URINALYSIS, ROUTINE W REFLEX MICROSCOPIC
GLUCOSE, UA: NEGATIVE mg/dL
Ketones, ur: 40 mg/dL — AB
Nitrite: POSITIVE — AB
SPECIFIC GRAVITY, URINE: 1.014 (ref 1.005–1.030)
pH: 6 (ref 5.0–8.0)

## 2016-07-12 LAB — URINE MICROSCOPIC-ADD ON

## 2016-07-12 LAB — CBC WITH DIFFERENTIAL/PLATELET
Basophils Absolute: 0 K/uL (ref 0.0–0.1)
Basophils Relative: 0 %
Eosinophils Absolute: 0 K/uL (ref 0.0–0.7)
Eosinophils Relative: 1 %
HCT: 29.2 % — ABNORMAL LOW (ref 39.0–52.0)
Hemoglobin: 9.8 g/dL — ABNORMAL LOW (ref 13.0–17.0)
Lymphocytes Relative: 33 %
Lymphs Abs: 1.2 K/uL (ref 0.7–4.0)
MCH: 29 pg (ref 26.0–34.0)
MCHC: 33.6 g/dL (ref 30.0–36.0)
MCV: 86.4 fL (ref 78.0–100.0)
Monocytes Absolute: 0.6 K/uL (ref 0.1–1.0)
Monocytes Relative: 17 %
Neutro Abs: 1.7 K/uL (ref 1.7–7.7)
Neutrophils Relative %: 49 %
Platelets: 103 K/uL — ABNORMAL LOW (ref 150–400)
RBC: 3.38 MIL/uL — ABNORMAL LOW (ref 4.22–5.81)
RDW: 13.4 % (ref 11.5–15.5)
WBC: 3.5 K/uL — ABNORMAL LOW (ref 4.0–10.5)

## 2016-07-12 MED ORDER — CEPHALEXIN 500 MG PO CAPS: 500.0000 mg | ORAL_CAPSULE | Freq: Three times a day (TID) | ORAL | 0 refills | Status: DC

## 2016-07-12 NOTE — ED Notes (Cosign Needed)
Pt signed out to me by resident Dr. Harlow Espinoza.  Patient presents with painless hematuria. Was treated for urinary tract infection with ciprofloxacin last week by PCP but the symptoms persist. Urine today shows signs of urinary tract infection. Suspect untreated UTI. Urine culture sent, will treat patient with Keflex 500 mg 3 times a day for 10 days. However given his age and is complaining, he will need to follow-up closely with urologist for further evaluation and to rule out malignancy. Patient does have an appointment to be seen by urologist on September 18. I encourage patient and family member to have a close follow-up as well as to be reevaluated by his PCP. Return precaution. Otherwise patient is stable for discharge.  BP 132/64   Pulse 82   Temp 98 F (36.7 C) (Oral)   Resp 18   Ht 5\' 10"  (1.778 m)   Wt 59 kg   SpO2 100%   BMI 18.65 kg/m   Results for orders placed or performed during the hospital encounter of 07/12/16  CBC with Differential  Result Value Ref Range   WBC 3.5 (L) 4.0 - 10.5 K/uL   RBC 3.38 (L) 4.22 - 5.81 MIL/uL   Hemoglobin 9.8 (L) 13.0 - 17.0 g/dL   HCT 29.2 (L) 39.0 - 52.0 %   MCV 86.4 78.0 - 100.0 fL   MCH 29.0 26.0 - 34.0 pg   MCHC 33.6 30.0 - 36.0 g/dL   RDW 13.4 11.5 - 15.5 %   Platelets 103 (L) 150 - 400 K/uL   Neutrophils Relative % 49 %   Neutro Abs 1.7 1.7 - 7.7 K/uL   Lymphocytes Relative 33 %   Lymphs Abs 1.2 0.7 - 4.0 K/uL   Monocytes Relative 17 %   Monocytes Absolute 0.6 0.1 - 1.0 K/uL   Eosinophils Relative 1 %   Eosinophils Absolute 0.0 0.0 - 0.7 K/uL   Basophils Relative 0 %   Basophils Absolute 0.0 0.0 - 0.1 K/uL  Comprehensive metabolic panel  Result Value Ref Range   Sodium 136 135 - 145 mmol/L   Potassium 4.2 3.5 - 5.1 mmol/L   Chloride 105 101 - 111 mmol/L   CO2 24 22 - 32 mmol/L   Glucose, Bld 111 (H) 65 - 99 mg/dL   BUN 10 6 - 20 mg/dL   Creatinine, Ser 1.33 (H) 0.61 - 1.24 mg/dL   Calcium 9.6 8.9 - 10.3 mg/dL   Total  Protein 6.2 (L) 6.5 - 8.1 g/dL   Albumin 4.2 3.5 - 5.0 g/dL   AST 36 15 - 41 U/L   ALT 5 (L) 17 - 63 U/L   Alkaline Phosphatase 51 38 - 126 U/L   Total Bilirubin 0.5 0.3 - 1.2 mg/dL   GFR calc non Af Amer 52 (L) >60 mL/min   GFR calc Af Amer >60 >60 mL/min   Anion gap 7 5 - 15  Urinalysis, Routine w reflex microscopic (not at [facility])  Result Value Ref Range   Color, Urine RED (A) YELLOW   APPearance CLOUDY (A) CLEAR   Specific Gravity, Urine 1.014 1.005 - 1.030   pH 6.0 5.0 - 8.0   Glucose, UA NEGATIVE NEGATIVE mg/dL   Hgb urine dipstick LARGE (A) NEGATIVE   Bilirubin Urine LARGE (A) NEGATIVE   Ketones, ur 40 (A) NEGATIVE mg/dL   Protein, ur >300 (A) NEGATIVE mg/dL   Nitrite POSITIVE (A) NEGATIVE   Leukocytes, UA MODERATE (A) NEGATIVE  Urine microscopic-add on  Result Value Ref  Range   Squamous Epithelial / LPF 6-30 (A) NONE SEEN   WBC, UA 6-30 0 - 5 WBC/hpf   RBC / HPF TOO NUMEROUS TO COUNT 0 - 5 RBC/hpf   Bacteria, UA FEW (A) NONE SEEN   Ct Abdomen Pelvis Wo Contrast  Result Date: 07/10/2016 CLINICAL DATA:  Hematuria and low back pain for 4 days. EXAM: CT ABDOMEN AND PELVIS WITHOUT CONTRAST TECHNIQUE: Multidetector CT imaging of the abdomen and pelvis was performed following the standard protocol without IV contrast. COMPARISON:  Abdominal ultrasound 07/09/2013, CT of the abdomen 09/07/2011 FINDINGS: Lower chest: No acute findings. The heart is normal in size. There is minimal pericardial thickening. Calcific atherosclerotic disease of the coronary arteries noted. Hepatobiliary: No mass visualized on this un-enhanced exam. Two large gallbladder calculi within the dependent portion of the gallbladder measuring 1.6 cm up to are again seen. Punctate calcification within the right lobe of the liver is stable. Pancreas: No mass or inflammatory process identified on this un-enhanced exam. Spleen: Within normal limits in size. Adrenals/Urinary Tract: No evidence of urolithiasis or  hydronephrosis. No evidence of nephrolithiasis. Heterogeneous attenuation of the left renal pelvis is noted with a questionable soft tissue mass extending into the proximal left ureter. Alternatively this may represent a collection of sludge layering in the dependent portion of the left renal pelvis. Stomach/Bowel: No evidence of obstruction, inflammatory process, or abnormal fluid collections. Vascular/Lymphatic: No pathologically enlarged lymph nodes. No evidence of abdominal aortic aneurysm. Reproductive: No mass or other significant abnormality. Other: None. Musculoskeletal:  No suspicious bone lesions identified. IMPRESSION: No evidence of obstructive uropathy. Heterogeneous appearance of the left renal pelvis and proximal left ureter with questionable soft tissue mass versus layering sludge within the dependent portion of the left renal pelvis. Lack of IV contrast precludes detailed evaluation of this abnormality. Electronically Signed   By: Fidela Salisbury M.D.   On: 07/10/2016 16:36      Domenic Moras, PA-C 07/12/16 1803

## 2016-07-12 NOTE — ED Notes (Signed)
[place] PA at bedside 

## 2016-07-12 NOTE — Discharge Instructions (Signed)
Please call your urologist tomorrow to try to schedule an appointment sooner than September. Inform the urologist's office that you were seen in the emergency room today and may have a drop in your hemoglobin, so you need to be seen sooner.   Also schedule an appointment with Dr. Shelia Media this week so he can recheck your hemoglobin to make sure it is not dropping further.   For your UTI, please stop the antibiotic you were taking (ciprofloxacin), and begin taking Keflex. You will take this three times a day for the next 10 days.

## 2016-07-12 NOTE — ED Notes (Signed)
Pt comfortable with discharge and follow up instructions. Rxx1.

## 2016-07-12 NOTE — ED Triage Notes (Signed)
Blood in ua  Since last Thursday saw dr and was placed  On antibiotics, is on  4 baby asa a day , still having blood in ua

## 2016-07-12 NOTE — ED Provider Notes (Signed)
[facility] DEPT Provider Note   CSN: [id number] Arrival date & time: 07/12/16  1409  First Provider Contact:  First MD Initiated Contact with Patient 07/12/16 1545     History   Chief Complaint Chief Complaint  Patient presents with  . Hematuria    HPI Kyle Espinoza is a 70 y.o. male.  HPI   Patient presenting with painless hematuria. Began 6 days ago. Was seen by PCP at this time, and was prescribed antibiotics for presumed infection. Patient cannot recall which antibiotics were prescribed. Was also complaining of L lower back pain, though this has since resolved. CT was performed which showed questionable mass vs layering sludge in the L renal pelvis. Hematuria did not improve, so he presented to ED today. Denies abdominal pain, dysuria. Endorses increased urinary frequency, but says he has been drinking more water as instructed by his PCP. Endorses some weakness and dizziness.   Past Medical History:  Diagnosis Date  . Allergy   . Colon polyp    2003 hyperplastic polyp Dr. Lajoyce Corners  . Hearing loss    bil/worse on right side/has hearing aid  . Hyperlipidemia   . Hypertension   . Thyroid disease     Patient Active Problem List   Diagnosis Date Noted  . TIA (transient ischemic attack) 01/09/2012  . Slurred speech 01/08/2012  . HTN (hypertension) 01/08/2012    Past Surgical History:  Procedure Laterality Date  . COLONOSCOPY    . HERNIA REPAIR     right inguinal  . kidney stones     removed /1987  . NECK SURGERY     bone spurs  . POLYPECTOMY        Home Medications    Prior to Admission medications   Medication Sig Start Date End Date Taking? Authorizing Provider  amLODipine (NORVASC) 10 MG tablet Take 10 mg by mouth daily.   Yes Historical Provider, MD  aspirin EC 325 MG EC tablet Take 1 tablet (325 mg total) by mouth daily. 01/09/12  Yes Nishant Dhungel, MD  atorvastatin (LIPITOR) 10 MG tablet Take 10 mg by mouth daily.   Yes Historical Provider, MD    carbidopa-levodopa (SINEMET IR) 25-100 MG tablet Take 1 tablet by mouth 3 (three) times daily. 09/29/15  Yes Star Age, MD  Cyanocobalamin (B-12) 1000 MCG SUBL Place 1 tablet under the tongue daily.    Yes Historical Provider, MD  levothyroxine (SYNTHROID, LEVOTHROID) 112 MCG tablet Take 112 mcg by mouth daily.   Yes Historical Provider, MD  Multiple Vitamin (MULTIVITAMIN) tablet Take 1 tablet by mouth daily.   Yes Historical Provider, MD  Omega-3 Fatty Acids (FISH OIL) 1000 MG CAPS Take 3,000 mg by mouth 2 (two) times daily.   Yes Historical Provider, MD    Family History Family History  Problem Relation Age of Onset  . Diabetes Sister     Social History Social History  Substance Use Topics  . Smoking status: Never Smoker  . Smokeless tobacco: Never Used  . Alcohol use No     Allergies   Phenergan [promethazine hcl]; Valium [diazepam]; Niacin and related; and Azithromycin   Review of Systems Review of Systems  Constitutional: Negative for chills and fever.  Respiratory: Negative for chest tightness and shortness of breath.   Cardiovascular: Negative for chest pain.  Gastrointestinal: Negative for abdominal pain, blood in stool, constipation, diarrhea, nausea and vomiting.  Genitourinary: Positive for hematuria. Negative for decreased urine volume, difficulty urinating, dysuria and frequency.  Musculoskeletal: Positive for  back pain (L lower).  Neurological: Positive for dizziness, tremors (Chronic) and weakness.   Physical Exam Updated Vital Signs BP 115/78 (BP Location: Left Arm)   Pulse 83   Temp 98 F (36.7 C) (Oral)   Resp 18   Ht 5\' 10"  (1.778 m)   Wt 59 kg   SpO2 100%   BMI 18.65 kg/m   Physical Exam  Constitutional: He is oriented to person, place, and time.  Elderly gentleman lying in bed in NAD  HENT:  Head: Normocephalic and atraumatic.  Right Ear: External ear normal.  Left Ear: External ear normal.  Nose: Nose normal.  Mouth/Throat: Oropharynx  is clear and moist. No oropharyngeal exudate.  Eyes: Conjunctivae and EOM are normal. Pupils are equal, round, and reactive to light. Right eye exhibits no discharge. Left eye exhibits no discharge. No scleral icterus.  Cardiovascular: Normal rate, regular rhythm, normal heart sounds and intact distal pulses.   No murmur heard. Pulmonary/Chest: Effort normal and breath sounds normal. No respiratory distress. He has no wheezes.  Abdominal: Soft. Bowel sounds are normal. He exhibits no distension and no mass. There is no tenderness.  Negative CVA tenderness bilaterally  Musculoskeletal: He exhibits no edema or tenderness.  Neurological: He is alert and oriented to person, place, and time.  Skin: Skin is warm and dry.  Psychiatric: He has a normal mood and affect. His behavior is normal.     ED Treatments / Results  Labs (all labs ordered are listed, but only abnormal results are displayed) Labs Reviewed  CBC WITH DIFFERENTIAL/PLATELET - Abnormal; Notable for the following:       Result Value   WBC 3.5 (*)    RBC 3.38 (*)    Hemoglobin 9.8 (*)    HCT 29.2 (*)    Platelets 103 (*)    All other components within normal limits  COMPREHENSIVE METABOLIC PANEL - Abnormal; Notable for the following:    Glucose, Bld 111 (*)    Creatinine, Ser 1.33 (*)    Total Protein 6.2 (*)    ALT 5 (*)    GFR calc non Af Amer 52 (*)    All other components within normal limits  URINE CULTURE  URINALYSIS, ROUTINE W REFLEX MICROSCOPIC (NOT AT [facility])    EKG  EKG Interpretation None       Radiology No results found.  Procedures Procedures (including critical care time)  Medications Ordered in ED Medications - No data to display   Initial Impression / Assessment and Plan / ED Course  I have reviewed the triage vital signs and the nursing notes.  Pertinent labs & imaging results that were available during my care of the patient were reviewed by me and considered in my medical decision making  (see chart for details).  Clinical Course    Final Clinical Impressions(s) / ED Diagnoses   Final diagnoses:  None   Patient presenting with painless hematuria. Already worked up by PCP, with CT showing possible mass of L renal pelvis. Was also started on cipro 6 days ago for UTI, so hematuria could be 2/2 hemorrhagic cystitis that has failed current antibiotic treatment. Hgb 9.8; most recent Hgb from 2 years ago so unsure if this is below his baseline. UA and culture pending.   Has appointment with [facility] currently scheduled for September 18, however patient instructed to call [facility] tomorrow to try to move up appointment. Also instructed to discontinue ASA. Patient to f/u with PCP Dr. Shelia Media this  week to recheck Hgb.   If UA with signs of infection, will administer one dose of Rocephin prior to discharge, and send home with Keflex.   Patient signed out to Domenic Moras, [place].   New Prescriptions New Prescriptions   No medications on file         Verner Mould, MD 07/12/16 [place], MD 07/12/16 2201

## 2016-07-13 LAB — URINE CULTURE: CULTURE: NO GROWTH

## 2016-07-14 DIAGNOSIS — E319 Polyglandular dysfunction, unspecified: Secondary | ICD-10-CM | POA: Diagnosis not present

## 2016-07-14 DIAGNOSIS — D500 Iron deficiency anemia secondary to blood loss (chronic): Secondary | ICD-10-CM | POA: Diagnosis not present

## 2016-07-14 DIAGNOSIS — R634 Abnormal weight loss: Secondary | ICD-10-CM | POA: Diagnosis not present

## 2016-07-14 DIAGNOSIS — R531 Weakness: Secondary | ICD-10-CM | POA: Diagnosis not present

## 2016-07-14 DIAGNOSIS — R319 Hematuria, unspecified: Secondary | ICD-10-CM | POA: Diagnosis not present

## 2016-07-20 DIAGNOSIS — D3000 Benign neoplasm of unspecified kidney: Secondary | ICD-10-CM | POA: Diagnosis not present

## 2016-07-21 DIAGNOSIS — R634 Abnormal weight loss: Secondary | ICD-10-CM | POA: Diagnosis not present

## 2016-07-21 DIAGNOSIS — D500 Iron deficiency anemia secondary to blood loss (chronic): Secondary | ICD-10-CM | POA: Diagnosis not present

## 2016-07-21 DIAGNOSIS — D649 Anemia, unspecified: Secondary | ICD-10-CM | POA: Diagnosis not present

## 2016-07-21 DIAGNOSIS — R319 Hematuria, unspecified: Secondary | ICD-10-CM | POA: Diagnosis not present

## 2016-07-24 ENCOUNTER — Other Ambulatory Visit: Payer: Self-pay | Admitting: Urology

## 2016-07-27 DIAGNOSIS — D649 Anemia, unspecified: Secondary | ICD-10-CM | POA: Diagnosis not present

## 2016-07-27 DIAGNOSIS — D61818 Other pancytopenia: Secondary | ICD-10-CM | POA: Diagnosis not present

## 2016-07-27 DIAGNOSIS — Z23 Encounter for immunization: Secondary | ICD-10-CM | POA: Diagnosis not present

## 2016-07-27 DIAGNOSIS — R634 Abnormal weight loss: Secondary | ICD-10-CM | POA: Diagnosis not present

## 2016-07-27 DIAGNOSIS — Z01818 Encounter for other preprocedural examination: Secondary | ICD-10-CM | POA: Diagnosis not present

## 2016-07-27 DIAGNOSIS — R197 Diarrhea, unspecified: Secondary | ICD-10-CM | POA: Diagnosis not present

## 2016-07-31 ENCOUNTER — Telehealth: Payer: Self-pay | Admitting: Oncology

## 2016-07-31 ENCOUNTER — Encounter: Payer: Self-pay | Admitting: Oncology

## 2016-07-31 NOTE — Telephone Encounter (Signed)
Pt confirmed appt, completed intake, mailed new pt letter, referring provider obtain autho [phone]) and aware of appt date/time.

## 2016-08-03 DIAGNOSIS — D3000 Benign neoplasm of unspecified kidney: Secondary | ICD-10-CM | POA: Diagnosis not present

## 2016-08-04 ENCOUNTER — Encounter (HOSPITAL_BASED_OUTPATIENT_CLINIC_OR_DEPARTMENT_OTHER): Payer: Self-pay | Admitting: *Deleted

## 2016-08-04 NOTE — Progress Notes (Signed)
SPOKE W/ PT'S WIFE, PT VERY HOH OVER PHONE.  NPO AFTER MN.  ARRIVE AT 0600.  CURRENT LAB RESULTS AND EKG IN CHART AND EPIC.  WILL TAKE SYNTHROID, NORVASC, LIPITOR, COLACE, AND LOSARTAN AM DOS W/ SIPS OF WATER.

## 2016-08-10 ENCOUNTER — Ambulatory Visit (HOSPITAL_BASED_OUTPATIENT_CLINIC_OR_DEPARTMENT_OTHER)
Admission: RE | Admit: 2016-08-10 | Discharge: 2016-08-10 | Disposition: A | Discharge status: Home / Self Care | Payer: Commercial Managed Care - HMO | Source: Ambulatory Visit | Attending: Urology | Admitting: Urology

## 2016-08-10 ENCOUNTER — Ambulatory Visit (HOSPITAL_BASED_OUTPATIENT_CLINIC_OR_DEPARTMENT_OTHER): Payer: Commercial Managed Care - HMO | Admitting: Anesthesiology

## 2016-08-10 ENCOUNTER — Encounter (HOSPITAL_BASED_OUTPATIENT_CLINIC_OR_DEPARTMENT_OTHER): Payer: Self-pay | Admitting: *Deleted

## 2016-08-10 ENCOUNTER — Encounter (HOSPITAL_BASED_OUTPATIENT_CLINIC_OR_DEPARTMENT_OTHER)
Admission: RE | Disposition: A | Discharge status: Home / Self Care | Payer: Self-pay | Source: Ambulatory Visit | Attending: Urology

## 2016-08-10 DIAGNOSIS — Z79899 Other long term (current) drug therapy: Secondary | ICD-10-CM | POA: Insufficient documentation

## 2016-08-10 DIAGNOSIS — I129 Hypertensive chronic kidney disease with stage 1 through stage 4 chronic kidney disease, or unspecified chronic kidney disease: Secondary | ICD-10-CM | POA: Insufficient documentation

## 2016-08-10 DIAGNOSIS — D649 Anemia, unspecified: Secondary | ICD-10-CM | POA: Insufficient documentation

## 2016-08-10 DIAGNOSIS — E785 Hyperlipidemia, unspecified: Secondary | ICD-10-CM | POA: Insufficient documentation

## 2016-08-10 DIAGNOSIS — D4959 Neoplasm of unspecified behavior of other genitourinary organ: Secondary | ICD-10-CM | POA: Diagnosis not present

## 2016-08-10 DIAGNOSIS — N2889 Other specified disorders of kidney and ureter: Secondary | ICD-10-CM | POA: Insufficient documentation

## 2016-08-10 DIAGNOSIS — D3012 Benign neoplasm of left renal pelvis: Secondary | ICD-10-CM | POA: Diagnosis not present

## 2016-08-10 DIAGNOSIS — M1990 Unspecified osteoarthritis, unspecified site: Secondary | ICD-10-CM | POA: Diagnosis not present

## 2016-08-10 DIAGNOSIS — N3081 Other cystitis with hematuria: Secondary | ICD-10-CM | POA: Insufficient documentation

## 2016-08-10 DIAGNOSIS — N3080 Other cystitis without hematuria: Secondary | ICD-10-CM | POA: Diagnosis not present

## 2016-08-10 DIAGNOSIS — N1330 Unspecified hydronephrosis: Secondary | ICD-10-CM | POA: Insufficient documentation

## 2016-08-10 DIAGNOSIS — I350 Nonrheumatic aortic (valve) stenosis: Secondary | ICD-10-CM | POA: Diagnosis not present

## 2016-08-10 DIAGNOSIS — R310 Gross hematuria: Secondary | ICD-10-CM | POA: Diagnosis not present

## 2016-08-10 DIAGNOSIS — Z8673 Personal history of transient ischemic attack (TIA), and cerebral infarction without residual deficits: Secondary | ICD-10-CM | POA: Diagnosis not present

## 2016-08-10 DIAGNOSIS — N189 Chronic kidney disease, unspecified: Secondary | ICD-10-CM | POA: Diagnosis not present

## 2016-08-10 DIAGNOSIS — R896 Abnormal cytological findings in specimens from other organs, systems and tissues: Secondary | ICD-10-CM | POA: Diagnosis not present

## 2016-08-10 HISTORY — DX: Congenital malformation of eye, unspecified: Q15.9

## 2016-08-10 HISTORY — DX: Presence of external hearing-aid: Z97.4

## 2016-08-10 HISTORY — DX: Anemia, unspecified: D64.9

## 2016-08-10 HISTORY — DX: Personal history of colonic polyps: Z86.010

## 2016-08-10 HISTORY — DX: Parkinson's disease: G20

## 2016-08-10 HISTORY — DX: Other pancytopenia: D61.818

## 2016-08-10 HISTORY — DX: Personal history of adenomatous and serrated colon polyps: Z86.0101

## 2016-08-10 HISTORY — DX: Presence of spectacles and contact lenses: Z97.3

## 2016-08-10 HISTORY — DX: Personal history of transient ischemic attack (TIA), and cerebral infarction without residual deficits: Z86.73

## 2016-08-10 HISTORY — DX: Parkinsonism, unspecified: G20.C

## 2016-08-10 HISTORY — PX: CYSTOSCOPY WITH RETROGRADE PYELOGRAM, URETEROSCOPY AND STENT PLACEMENT: SHX5789

## 2016-08-10 HISTORY — DX: Splenomegaly, not elsewhere classified: R16.1

## 2016-08-10 HISTORY — DX: Neoplasm of unspecified behavior of other genitourinary organ: D49.59

## 2016-08-10 HISTORY — DX: Personal history of urinary calculi: Z87.442

## 2016-08-10 HISTORY — DX: Other abnormalities of gait and mobility: R26.89

## 2016-08-10 HISTORY — DX: Hypothyroidism, unspecified: E03.9

## 2016-08-10 HISTORY — DX: Chronic kidney disease, stage 2 (mild): N18.2

## 2016-08-10 HISTORY — DX: Presence of dental prosthetic device (complete) (partial): Z97.2

## 2016-08-10 HISTORY — DX: Legal blindness, as defined in USA: H54.8

## 2016-08-10 HISTORY — DX: Unspecified osteoarthritis, unspecified site: M19.90

## 2016-08-10 LAB — POCT HEMOGLOBIN-HEMACUE: Hemoglobin: 9.8 g/dL — ABNORMAL LOW (ref 13.0–17.0)

## 2016-08-10 SURGERY — CYSTOURETEROSCOPY, WITH RETROGRADE PYELOGRAM AND STENT INSERTION
Anesthesia: General | Site: Ureter | Laterality: Left

## 2016-08-10 MED ORDER — TRAMADOL HCL 50 MG PO TABS: 50.0000 mg | ORAL_TABLET | Freq: Four times a day (QID) | ORAL | 0 refills | Status: DC | PRN

## 2016-08-10 MED ORDER — ONDANSETRON HCL 4 MG/2ML IJ SOLN
INTRAMUSCULAR | Status: DC | PRN
Start: 2016-08-10 — End: 2016-08-10
  Administered 2016-08-10: 4 mg via INTRAVENOUS

## 2016-08-10 MED ORDER — MEPERIDINE HCL 25 MG/ML IJ SOLN
6.2500 mg | INTRAMUSCULAR | Status: DC | PRN
  Filled 2016-08-10: qty 1

## 2016-08-10 MED ORDER — STERILE WATER FOR IRRIGATION IR SOLN
Status: DC | PRN
  Administered 2016-08-10: 3000 mL

## 2016-08-10 MED ORDER — ONDANSETRON HCL 4 MG/2ML IJ SOLN
4.0000 mg | Freq: Once | INTRAMUSCULAR | Status: DC | PRN
  Filled 2016-08-10: qty 2

## 2016-08-10 MED ORDER — PROPOFOL 10 MG/ML IV BOLUS
INTRAVENOUS | Status: DC | PRN
  Administered 2016-08-10: 160 mg via INTRAVENOUS

## 2016-08-10 MED ORDER — CIPROFLOXACIN HCL 500 MG PO TABS: 500.0000 mg | ORAL_TABLET | Freq: Once | ORAL | 0 refills | Status: AC

## 2016-08-10 MED ORDER — FENTANYL CITRATE (PF) 100 MCG/2ML IJ SOLN
INTRAMUSCULAR | Status: DC | PRN
  Administered 2016-08-10 (×2): 25 ug via INTRAVENOUS

## 2016-08-10 MED ORDER — LACTATED RINGERS IV SOLN
INTRAVENOUS | Status: DC
  Administered 2016-08-10: 07:00:00 via INTRAVENOUS
  Filled 2016-08-10: qty 1000

## 2016-08-10 MED ORDER — LIDOCAINE 2% (20 MG/ML) 5 ML SYRINGE
INTRAMUSCULAR | Status: DC | PRN
  Administered 2016-08-10: 100 mg via INTRAVENOUS

## 2016-08-10 MED ORDER — DEXAMETHASONE SODIUM PHOSPHATE 10 MG/ML IJ SOLN
INTRAMUSCULAR | Status: AC
  Filled 2016-08-10: qty 1

## 2016-08-10 MED ORDER — ARTIFICIAL TEARS OP OINT
TOPICAL_OINTMENT | OPHTHALMIC | Status: AC
  Filled 2016-08-10: qty 7

## 2016-08-10 MED ORDER — MIDAZOLAM HCL 2 MG/2ML IJ SOLN
INTRAMUSCULAR | Status: AC
  Filled 2016-08-10: qty 2

## 2016-08-10 MED ORDER — DEXAMETHASONE SODIUM PHOSPHATE 4 MG/ML IJ SOLN
INTRAMUSCULAR | Status: DC | PRN
  Administered 2016-08-10: 10 mg via INTRAVENOUS

## 2016-08-10 MED ORDER — PROPOFOL 10 MG/ML IV BOLUS
INTRAVENOUS | Status: AC
  Filled 2016-08-10: qty 40

## 2016-08-10 MED ORDER — HYDROMORPHONE HCL 1 MG/ML IJ SOLN
0.2500 mg | INTRAMUSCULAR | Status: DC | PRN
  Filled 2016-08-10: qty 1

## 2016-08-10 MED ORDER — PHENYLEPHRINE HCL 10 MG/ML IJ SOLN
INTRAMUSCULAR | Status: DC | PRN
  Administered 2016-08-10 (×3): 80 ug via INTRAVENOUS

## 2016-08-10 MED ORDER — LIDOCAINE 2% (20 MG/ML) 5 ML SYRINGE
INTRAMUSCULAR | Status: AC
  Filled 2016-08-10: qty 5

## 2016-08-10 MED ORDER — BELLADONNA ALKALOIDS-OPIUM 16.2-60 MG RE SUPP
RECTAL | Status: DC | PRN
  Administered 2016-08-10: 1 via RECTAL

## 2016-08-10 MED ORDER — CIPROFLOXACIN IN D5W 400 MG/200ML IV SOLN
400.0000 mg | INTRAVENOUS | Status: AC
  Administered 2016-08-10: 400 mg via INTRAVENOUS
  Filled 2016-08-10: qty 200

## 2016-08-10 MED ORDER — IOHEXOL 300 MG/ML  SOLN
INTRAMUSCULAR | Status: DC | PRN
  Administered 2016-08-10: 35 mL

## 2016-08-10 MED ORDER — KETOROLAC TROMETHAMINE 30 MG/ML IJ SOLN
INTRAMUSCULAR | Status: AC
  Filled 2016-08-10: qty 1

## 2016-08-10 MED ORDER — FENTANYL CITRATE (PF) 100 MCG/2ML IJ SOLN
INTRAMUSCULAR | Status: AC
  Filled 2016-08-10: qty 2

## 2016-08-10 MED ORDER — BELLADONNA ALKALOIDS-OPIUM 16.2-60 MG RE SUPP
RECTAL | Status: AC
  Filled 2016-08-10: qty 1

## 2016-08-10 MED ORDER — LIDOCAINE HCL 2 % EX GEL
CUTANEOUS | Status: DC | PRN
  Administered 2016-08-10: 1 via URETHRAL

## 2016-08-10 MED ORDER — PHENYLEPHRINE 40 MCG/ML (10ML) SYRINGE FOR IV PUSH (FOR BLOOD PRESSURE SUPPORT)
PREFILLED_SYRINGE | INTRAVENOUS | Status: AC
  Filled 2016-08-10: qty 10

## 2016-08-10 MED ORDER — ONDANSETRON HCL 4 MG/2ML IJ SOLN
INTRAMUSCULAR | Status: AC
  Filled 2016-08-10: qty 2

## 2016-08-10 MED ORDER — SODIUM CHLORIDE 0.9 % IR SOLN
Status: DC | PRN
  Administered 2016-08-10: 3000 mL

## 2016-08-10 MED ORDER — CIPROFLOXACIN IN D5W 400 MG/200ML IV SOLN
INTRAVENOUS | Status: AC
  Filled 2016-08-10: qty 200

## 2016-08-10 MED ORDER — MIDAZOLAM HCL 5 MG/5ML IJ SOLN
INTRAMUSCULAR | Status: DC | PRN
  Administered 2016-08-10: 0.5 mg via INTRAVENOUS

## 2016-08-10 SURGICAL SUPPLY — 35 items
BAG DRAIN URO-CYSTO SKYTR STRL (DRAIN) ×4 IMPLANT
BASKET DAKOTA 1.9FR 11X120 (BASKET) IMPLANT
BASKET LASER NITINOL 1.9FR (BASKET) IMPLANT
BASKET STNLS GEMINI 4WIRE 3FR (BASKET) IMPLANT
BASKET ZERO TIP NITINOL 2.4FR (BASKET) IMPLANT
BRUSH URET BIOPSY 3F (UROLOGICAL SUPPLIES) ×4 IMPLANT
CATH URET 5FR 28IN OPEN ENDED (CATHETERS) ×4 IMPLANT
CATH URET DUAL LUMEN 6-10FR 50 (CATHETERS) IMPLANT
CLOTH BEACON ORANGE TIMEOUT ST (SAFETY) ×4 IMPLANT
FIBER LASER TRAC TIP (UROLOGICAL SUPPLIES) IMPLANT
GLOVE BIO SURGEON STRL SZ 6.5 (GLOVE) ×3 IMPLANT
GLOVE BIO SURGEON STRL SZ7.5 (GLOVE) ×4 IMPLANT
GLOVE BIO SURGEONS STRL SZ 6.5 (GLOVE) ×1
GLOVE INDICATOR 6.5 STRL GRN (GLOVE) ×4 IMPLANT
GOWN STRL REUS W/ TWL XL LVL3 (GOWN DISPOSABLE) ×2 IMPLANT
GOWN STRL REUS W/TWL LRG LVL3 (GOWN DISPOSABLE) ×4 IMPLANT
GOWN STRL REUS W/TWL XL LVL3 (GOWN DISPOSABLE) ×6 IMPLANT
GUIDEWIRE 0.038 PTFE COATED (WIRE) IMPLANT
GUIDEWIRE ANG ZIPWIRE 038X150 (WIRE) IMPLANT
GUIDEWIRE STR DUAL SENSOR (WIRE) ×4 IMPLANT
IV NS IRRIG 3000ML ARTHROMATIC (IV SOLUTION) ×4 IMPLANT
KIT BALLIN UROMAX 15FX10 (LABEL) IMPLANT
KIT BALLN UROMAX 15FX4 (MISCELLANEOUS) IMPLANT
KIT BALLN UROMAX 26 75X4 (MISCELLANEOUS)
KIT ROOM TURNOVER WOR (KITS) ×4 IMPLANT
MANIFOLD NEPTUNE II (INSTRUMENTS) ×4 IMPLANT
NS IRRIG 500ML POUR BTL (IV SOLUTION) ×4 IMPLANT
PACK CYSTO (CUSTOM PROCEDURE TRAY) ×4 IMPLANT
SET HIGH PRES BAL DIL (LABEL)
SHEATH ACCESS URETERAL 38CM (SHEATH) IMPLANT
STENT URET 6FRX26 CONTOUR (STENTS) ×4 IMPLANT
TUBE CONNECTING 12'X1/4 (SUCTIONS) ×1
TUBE CONNECTING 12X1/4 (SUCTIONS) ×3 IMPLANT
TUBE FEEDING 8FR 16IN STR KANG (MISCELLANEOUS) IMPLANT
WATER STERILE IRR 3000ML UROMA (IV SOLUTION) ×4 IMPLANT

## 2016-08-10 NOTE — Anesthesia Procedure Notes (Signed)
Procedure Name: LMA Insertion Date/Time: 08/10/2016 7:46 AM Performed by: Wanita Chamberlain Pre-anesthesia Checklist: Patient identified, Timeout performed, Emergency Drugs available, Suction available and Patient being monitored Patient Re-evaluated:Patient Re-evaluated prior to inductionPreoxygenation: Pre-oxygenation with 100% oxygen Intubation Type: IV induction Ventilation: Mask ventilation without difficulty LMA: LMA inserted LMA Size: 4.0 Number of attempts: 1 Placement Confirmation: CO2 detector and positive ETCO2 Dental Injury: Teeth and Oropharynx as per pre-operative assessment

## 2016-08-10 NOTE — Transfer of Care (Signed)
Immediate Anesthesia Transfer of Care Note  Patient: Kyle Espinoza  Procedure(s) Performed: Procedure(s): CYSTOSCOPY WITH LEFT  RETROGRADE PYELOGRAM, URETEROSCOPY URETERAL BIOPSY,  AND STENT PLACEMENT (Left)  Patient Location: PACU  Anesthesia Type:General  Level of Consciousness: awake, alert , oriented and patient cooperative  Airway & Oxygen Therapy: Patient Spontanous Breathing and Patient connected to nasal cannula oxygen  Post-op Assessment: Report given to RN and Post -op Vital signs reviewed and stable  Post vital signs: Reviewed and stable  Last Vitals:  Vitals:   08/10/16 0630  BP: (!) 148/81  Pulse: 94  Resp: 16  Temp: 36.4 C    Last Pain:  Vitals:   08/10/16 0630  TempSrc: Oral      Patients Stated Pain Goal: 7 ([id number])  Complications: No apparent anesthesia complications

## 2016-08-10 NOTE — Anesthesia Preprocedure Evaluation (Addendum)
Anesthesia Evaluation  Patient identified by MRN, date of birth, ID band Patient awake    Reviewed: Allergy & Precautions, NPO status , Patient's Chart, lab work & pertinent test results  Airway Mallampati: I  TM Distance: >3 FB Neck ROM: Full    Dental  (+) Dental Advisory Given, Loose, Missing, Edentulous Upper,    Pulmonary    Pulmonary exam normal        Cardiovascular hypertension, Pt. on medications Normal cardiovascular exam Rhythm:Regular Rate:Normal     Neuro/Psych Parkinson's TIA   GI/Hepatic   Endo/Other  Hypothyroidism   Renal/GU Renal disease     Musculoskeletal  (+) Arthritis , Osteoarthritis,    Abdominal   Peds  Hematology  (+) anemia ,   Anesthesia Other Findings   Reproductive/Obstetrics                            Anesthesia Physical Anesthesia Plan  ASA: II  Anesthesia Plan: General   Post-op Pain Management:    Induction: Intravenous  Airway Management Planned: LMA  Additional Equipment:   Intra-op Plan:   Post-operative Plan: Extubation in OR  Informed Consent: I have reviewed the patients History and Physical, chart, labs and discussed the procedure including the risks, benefits and alternatives for the proposed anesthesia with the patient or authorized representative who has indicated his/her understanding and acceptance.     Plan Discussed with: CRNA, Surgeon and Anesthesiologist  Anesthesia Plan Comments:        Anesthesia Quick Evaluation

## 2016-08-10 NOTE — Anesthesia Postprocedure Evaluation (Signed)
Anesthesia Post Note  Patient: Kyle Espinoza  Procedure(s) Performed: Procedure(s) (LRB): CYSTOSCOPY WITH LEFT  RETROGRADE PYELOGRAM, URETEROSCOPY URETERAL BIOPSY,  AND STENT PLACEMENT (Left)  Patient location during evaluation: PACU Anesthesia Type: General Level of consciousness: awake and alert Pain management: pain level controlled Vital Signs Assessment: post-procedure vital signs reviewed and stable Respiratory status: spontaneous breathing, nonlabored ventilation, respiratory function stable and patient connected to nasal cannula oxygen Cardiovascular status: blood pressure returned to baseline and stable Postop Assessment: no signs of nausea or vomiting Anesthetic complications: no    Last Vitals:  Vitals:   08/10/16 1035 08/10/16 1040  BP: (!) 122/93   Pulse: 93   Resp: 14   Temp:  36.6 C    Last Pain:  Vitals:   08/10/16 0630  TempSrc: Oral                 Imran Nuon DAVID

## 2016-08-10 NOTE — Discharge Instructions (Signed)
[facility] [phone] Post Ureteroscopy With or Without Stent Instructions  Definitions:  Ureter: The duct that transports urine from the kidney to the bladder. Stent:   A plastic hollow tube that is placed into the ureter, from the kidney to the                 bladder to prevent the ureter from swelling shut.  GENERAL INSTRUCTIONS:  Despite the fact that no skin incisions were used, the area around the ureter and bladder is raw and irritated. The stent is a foreign body which will further irritate the bladder wall. This irritation is manifested by increased frequency of urination, both day and night, and by an increase in the urge to urinate. In some, the urge to urinate is present almost always. Sometimes the urge is strong enough that you may not be able to stop yourself from urinating. The only real cure is to remove the stent and then give time for the bladder wall to heal which can't be done until the danger of the ureter swelling shut has passed, which varies.  You may see some blood in your urine while the stent is in place and a few days afterwards. Do not be alarmed, even if the urine was clear for a while. Get off your feet and drink lots of fluids until clearing occurs. If you start to pass clots or don't improve, call us.  DIET: You may return to your normal diet immediately. Because of the raw surface of your bladder, alcohol, spicy foods, acid type foods and drinks with caffeine may cause irritation or frequency and should be used in moderation. To keep your urine flowing freely and to avoid constipation, drink plenty of fluids during the day ( 8-10 glasses ). Tip: Avoid cranberry juice because it is very acidic.  ACTIVITY: Your physical activity doesn't need to be restricted. However, if you are very active, you may see some blood in your urine. We suggest that you reduce your activity under these circumstances until the bleeding has  stopped.  BOWELS: It is important to keep your bowels regular during the postoperative period. Straining with bowel movements can cause bleeding. A bowel movement every other day is reasonable. Use a mild laxative if needed, such as Milk of Magnesia 2-3 tablespoons, or 2 Dulcolax tablets. Call if you continue to have problems. If you have been taking narcotics for pain, before, during or after your surgery, you may be constipated. Take a laxative if necessary.   MEDICATION: You should resume your pre-surgery medications unless told not to. In addition you will often be given an antibiotic to prevent infection. These should be taken as prescribed until the bottles are finished unless you are having an unusual reaction to one of the drugs.  PROBLEMS YOU SHOULD REPORT TO [place]:  Fevers over 100.5 Fahrenheit.  Heavy bleeding, or clots ( See above notes about blood in urine ).  Inability to urinate.  Drug reactions ( hives, rash, nausea, vomiting, diarrhea ).  Severe burning or pain with urination that is not improving.  FOLLOW-UP: You will need a follow-up appointment to monitor your progress. Call for this appointment at the number listed above. Usually the first appointment will be about three to fourteen days after your surgery.      Post Anesthesia Home Care Instructions  Activity: Get plenty of rest for the remainder of the day. A responsible adult should stay with you for 24 hours following the procedure.  For the next 24 hours, DO NOT: -Drive a car -Paediatric nurse -Drink alcoholic beverages -Take any medication unless instructed by your physician -Make any legal decisions or sign important papers.  Meals: Start with liquid foods such as gelatin or soup. Progress to regular foods as tolerated. Avoid greasy, spicy, heavy foods. If nausea and/or vomiting occur, drink only clear liquids until the nausea and/or vomiting subsides. Call your physician if vomiting  continues.  Special Instructions/Symptoms: Your throat may feel dry or sore from the anesthesia or the breathing tube placed in your throat during surgery. If this causes discomfort, gargle with warm salt water. The discomfort should disappear within 24 hours.  If you had a scopolamine patch placed behind your ear for the management of post- operative nausea and/or vomiting:  1. The medication in the patch is effective for 72 hours, after which it should be removed.  Wrap patch in a tissue and discard in the trash. Wash hands thoroughly with soap and water. 2. You may remove the patch earlier than 72 hours if you experience unpleasant side effects which may include dry mouth, dizziness or visual disturbances. 3. Avoid touching the patch. Wash your hands with soap and water after contact with the patch.    DISCHARGE INSTRUCTIONS FOR KIDNEY STONE/URETERAL STENT   MEDICATIONS:  1.  Resume all your other meds from home - except do not take any extra narcotic pain meds that you may have at home.  2. Tramadol is for moderate/severe pain, otherwise taking upto 1000 mg every 6 hours of plainTylenol will help treat your pain.   3.  Take Cipro one hour prior to removal of your stent.   ACTIVITY:  1. No strenuous activity x 1week  2. No driving while on narcotic pain medications  3. Drink plenty of water  4. Continue to walk at home - you can still get blood clots when you are at home, so keep active, but don't over do it.  5. May return to work/school tomorrow or when you feel ready   BATHING:  1. You can shower and we recommend daily showers  2. You have a string coming from your urethra: The stent string is attached to your ureteral stent. Do not pull on this.   SIGNS/SYMPTOMS TO CALL:  Please call us if you have a fever greater than 101.5, uncontrolled nausea/vomiting, uncontrolled pain, dizziness, unable to urinate, bloody urine, chest pain, shortness of breath, leg swelling, leg pain,  redness around wound, drainage from wound, or any other concerns or questions.   You can reach [place] at [phone].   FOLLOW-UP:  1. You have an appointment in 2 weeks. 2. You have a string attached to your stent, you may remove it on Monday, Sept 11. To do this, pull the strings until the stents are completely removed. You may feel an odd sensation in your back.

## 2016-08-10 NOTE — Op Note (Signed)
Preoperative diagnosis:  1. Gross hematuria  2. Left renal pelvis filling defect  Postoperative diagnosis:  1. same   Procedure: 1. Cystoscopy 2. Bilateral  Retrograde pyelograms with interpretation 3. Left ureteroscopy, left renal pelvis mass 4. Left ureteral stent placement  Surgeon: Ardis Hughs, MD  Anesthesia: General   Complications: None  Intraoperative findings:  #1 - right retrograde pyelogram - 10 mL of Omnipaque contrast was instilled to the distal aspect of the right ureter through a open-ended 5 French ureteral catheter demonstrating a normal caliber ureter with no filling defects, no hydronephrosis, and sharp calyces. #2 - left retrograde pyelogram-10 mL's of on the precontrast son to the distal aspect of the left ureter through an open-ended 5 French ureteral catheter demonstrating mild proximal hydronephrosis with no filling defects and sharp calyces. #3 - left ureteroscopy demonstrated no renal pelvic masses. There was evidence of hemorrhage within the renal pelvis parenchyma/urothelium. This area was brushed and biopsied, urine cytology sent, and small mucosal biopsies also obtained. #4-26 cm times 6 French double-J ureteral stent was placed in the left ureter using fluoroscopic guidance.  EBL: Minimal  Specimens: None  Indication: Kyle Espinoza is a 70 y.o. patient with hematuria and large left renal pelvis filling defect.  After reviewing the management options for treatment, he elected to proceed with the above surgical procedure(s). We have discussed the potential benefits and risks of the procedure, side effects of the proposed treatment, the likelihood of the patient achieving the goals of the procedure, and any potential problems that might occur during the procedure or recuperation. Informed consent has been obtained.  Description of procedure:  The patient was taken to the operating room and general anesthesia was induced.  The patient was placed in  the dorsal lithotomy position, prepped and draped in the usual sterile fashion, and preoperative antibiotics were administered. A preoperative time-out was performed.   21 French 30 cystoscope was gently passed to the patient's urethra and into the bladder under visual guidance. A 360 cystoscopy Scott evaluation was then performed with no significant grizzle abnormalities. There was some trabeculation and a high median prostatic bar. The ureters were orthotopic.  5 French open-ended ureteral catheter was then used to cannulate the patient's right ureter and a retrograde pyelogram was performed with the above findings. This process was a duplicated on the left ureter. I then advanced a 0.038 sensor wire through the open-ended catheter and into the left renal pelvis. I was then able to advance a's 4/6 French semirigid ureteroscope through the urethra and into the left distal ureter. I was able to advance the semirigid ureteroscope up to the renal pelvis with no significant ureteral abnormalities noted. I then advanced a second 0.038 sensor wire through the ureteroscope backed the scope out over the wire.  I then advanced a flexible ureteroscope over the second wire and was able to advance it into the mid ureter before removing the wire. I then performed ureteroscopy up into the left renal pelvis with no significant difficulty noting no significant abnormality. Once into the renal pelvis and noted evidence of hemorrhage within the renal pelvis and the lower calyces with no significant because all abnormality. There are no tumors or suspicious lesions. Pyeloscopy was performed throughout the kidney but no other significant abnormality. I then performed a brush biopsy of the renal pelvis, sent a separate urine cytology from this area, and performed small tissue biopsies times 3. I then backed out the ureteroscope under visual guidance noting a  atraumatic ureter.  I then backloaded the wire through the  cystoscope and passed a cystoscope again through the patient's urethra and into the bladder. I then advanced a 6 [place] times 26 cm double-J stent through the patient's left ureter and into the renal pelvis removing the wire once the stent was noted to be up in the kidney. I then pulled the scope back to the bladder neck and advanced the stent up to the bladder neck removing the wire entirely noting a nice curl in the bladder. The stent tether remained on which was secured to the dorsal aspect of the patient's penis. The bladder was then emptied.  Lidocaine jelly was then injected into the patient's urethra and a B&O suppository placed. On digital rectal exam the patient's prostate was noted to be normal.  The patient subsequent exudate or return to PACU stable condition. There are no untoward preoperative complications.    Ardis Hughs, M.D.

## 2016-08-10 NOTE — H&P (Signed)
Renal Mass HPI: Kyle Espinoza is a 70 year-old male patient who was referred by Dr. Deland Pretty, MD who is here further eval and management of a renal mass.  The mass is on the left side.   The lesion(s) was first noted on approximately 07/14/2016. The mass was seen on CT Scan.   His symptoms include back pain and blood in urine. Patient denies having flank pain, groin pain, nausea, vomiting, fever, and chills. He has seen blood in his urine. He does have a good appetite. He is not having pain in new locations. He has not recently had unwanted weight loss.   He has not had previous abdominal surgery. The patient cannot walk a flight of steps.   The patient's past medical history is significant for stroke. There is not a a family history of kidney cancer. There is no family history of brain tumors (AMLs), seizures or brain aneurysm's.   Prostate nodule stable (seen by Dr. Jeffie Pollock in 22-Mar-2011)  Small left epididymal cyst.  Slurred speech, On Sinamet - presumably for Parkinson Disease although I don't see a diagnosis.  Mobility limited by osteoarthritis.   Mild CKD - baseline Creatinine ~1.4  Mild Aortic stenosis  Avid [employer] fan.     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 That was in 10/12.   Left spermatocele. He was noted on exam to have a  left spermatocele by Dr. Jeffie Pollock in 8/12.   Prostate nodule: He was seen by Dr. Jeffie Pollock in 8/12 for a prostate nodule that was described as apical induration on the right hand side that was felt to most likely be calcification. PSA in 6/12 was 0.3.   NON-GU PMH: Cardiac murmur, unspecified, Murmurs - 2013/03/21 Personal history of other diseases of the circulatory system, History of hypertension - 2013-03-21 Personal history of other endocrine, nutritional and metabolic disease, History of hyperlipidemia - Mar 21, 2013    FAMILY HISTORY: Death of family member - Father, Mother No Significant Family History - Runs In Family   SOCIAL HISTORY: Marital Status: Single Current Smoking Status: Patient has never smoked.  Has never drank.  Does not use drugs. Drinks 1 caffeinated drink per day.     Notes: Occupation:, Caffeine Use, Alcohol Use, Marital History - Single, Never A Smoker, no children   REVIEW OF SYSTEMS:    GU Review Male:  Patient reports frequent urination and hard to postpone urination. Patient denies burning/ pain with urination, get up at night to urinate, leakage of urine, stream starts and stops, trouble starting your stream, have to strain to urinate , erection problems, and penile pain.   Gastrointestinal (Upper):  Patient denies nausea, vomiting, and indigestion/ heartburn.   Gastrointestinal (Lower):  Patient denies diarrhea and constipation.   Constitutional:  Patient reports weight loss. Patient denies  fever, night sweats, and fatigue.   Skin:  Patient denies skin rash/ lesion and itching.   Eyes:  Patient denies blurred vision and double vision.   Ears/ Nose/ Throat:  Patient denies sore throat and sinus problems.   Hematologic/Lymphatic:  Patient reports easy bruising. Patient denies swollen glands.   Cardiovascular:  Patient denies leg swelling and chest pains.   Respiratory:  Patient denies cough and shortness of breath.   Endocrine:  Patient denies excessive thirst.   Musculoskeletal:   Patient reports joint pain. Patient denies back pain.   Neurological:  Patient denies headaches and dizziness.   Psychologic:  Patient denies depression and anxiety.   VITAL SIGNS:      07/20/2016 01:57 PM    Weight 140 lb / 63.5 kg    Height 70 in / 177.8 cm    BP 146/76 mmHg    Pulse 96 /min    Temperature 97.6 F / 36 C    BMI 20.1 kg/m    MULTI-SYSTEM PHYSICAL EXAMINATION:     Constitutional: Well-nourished. No physical deformities. Normally developed. Good grooming.    Neck: Neck symmetrical, not swollen. Normal tracheal position.    Respiratory: No labored breathing, no use of accessory muscles. CTA- B    Cardiovascular: Normal temperature, normal extremity pulses, no swelling, no varicosities. RRR, no murmur appreciated.     Lymphatic: No enlargement of neck, axillae, groin.    Skin: No paleness, no jaundice, no cyanosis. No lesion, no ulcer, no rash.    Neurologic / Psychiatric: Oriented to time, oriented to place, oriented to person. No depression, no anxiety, no agitation.    Gastrointestinal: No mass, no tenderness, no rigidity, non obese abdomen.    Eyes: Normal conjunctivae. Normal eyelids.    Ears, Nose, Mouth, and Throat: Left ear no scars, no lesions, no masses. Right ear no scars, no lesions, no masses. Nose no scars, no lesions, no masses. Normal hearing. Normal lips.    Musculoskeletal: Normal gait and station of head and neck.          PAST DATA REVIEWED:  Source Of History:  Patient Records Review:  Previous Patient Records X-Ray Review: C.T. Abdomen/Pelvis: Reviewed Films. Reviewed Report. Discussed With Patient. Abnormality in left UPJ - tumor vs. clot.   PROCEDURES:   Urinalysis w/Scope - 81001  Dipstick Dipstick Cont'd Micro Specimen: Voided Bilirubin: Neg WBC/hpf: NS (Not Seen) Color: Red Ketones: Neg RBC/hpf: >60/hpf Appearance: Cloudy Blood: 3+ Bacteria: NS (Not Seen) Specific Gravity: 1.025 Protein: 2+ Cystals: NS (Not Seen) pH: 6.5 Urobilinogen:  0.2 Casts: NS (Not Seen) Glucose: Neg Nitrites: Neg Trichomonas: Not Present  Leukocyte Esterase: 1+ Mucous: Not Present   Epithelial Cells: 0-5/hpf   Yeast: NS (Not Seen)   Sperm: Not Present   ASSESSMENT:    ICD-10 Details 1 GU:  Benign Neo Kidney, Unspec - D30.00   PLAN:  Orders  Labs Urine Culture and Sensitivity Schedule  Return Visit: ASAP - Schedule Surgery Document  Letter(s):  Created for Patient: Clinical Summary  Notes:  Patient presented with gross hematuria and CT scan demonstrated a suspicious area in left UPJ. I have gone over the differential with the patient and suggested that we proceed to the OR for ureteroscopy and biopsy. I discussed the surgery with the patient and he understands that he'll have a stent following the procedure. He is also aware of the side-effects of the stent. We will then follow-up in 2 weeks to review the results of the pathology  and remove the stent if able. Patient will need surgical clearance.

## 2016-08-11 ENCOUNTER — Encounter (HOSPITAL_BASED_OUTPATIENT_CLINIC_OR_DEPARTMENT_OTHER): Payer: Self-pay | Admitting: Urology

## 2016-08-17 ENCOUNTER — Ambulatory Visit (HOSPITAL_BASED_OUTPATIENT_CLINIC_OR_DEPARTMENT_OTHER): Payer: Commercial Managed Care - HMO | Admitting: Oncology

## 2016-08-17 ENCOUNTER — Telehealth: Payer: Self-pay | Admitting: Oncology

## 2016-08-17 VITALS — BP 137/64 | HR 79 | Temp 97.0°F | Resp 17 | Ht 70.0 in | Wt 139.8 lb

## 2016-08-17 DIAGNOSIS — D61818 Other pancytopenia: Secondary | ICD-10-CM

## 2016-08-17 DIAGNOSIS — E119 Type 2 diabetes mellitus without complications: Secondary | ICD-10-CM

## 2016-08-17 DIAGNOSIS — R319 Hematuria, unspecified: Secondary | ICD-10-CM | POA: Diagnosis not present

## 2016-08-17 DIAGNOSIS — D649 Anemia, unspecified: Secondary | ICD-10-CM

## 2016-08-17 DIAGNOSIS — N182 Chronic kidney disease, stage 2 (mild): Secondary | ICD-10-CM

## 2016-08-17 NOTE — Telephone Encounter (Signed)
Gave patient avs report and appointments for December  °

## 2016-08-17 NOTE — Progress Notes (Signed)
Reason for Referral: Pancytopenia.   HPI: 70 year old gentleman with few comorbid conditions include hypertension, hyperlipidemia and chronic renal insufficiency. He was in his usual state of health when he presented with acute hematuria and his evaluation included CT scan of the abdomen and pelvis on 07/10/2016. The scan showed heterogeneous left renal pelvis and a questionable soft tissue mass versus layering sludge within the left renal pelvis. He subsequently evaluated by Dr. Louis Meckel and underwent a cystoscopy as well as left ureteroscopy and left renal pelvis mass biopsy. The pathology revealed no evidence of malignancy. A left ureteral stent was placed on that time. His hematuria has subsided since his operation. During that time he was noted to have white cell count of 3.5 hemoglobin around 9.8 and a platelet count of 103. CBC on 07/27/2016 obtained by Dr. Shelia Media showed a white cell count of 2.4 hemoglobin of 9.5 and a platelet count of 101. His differential showed a slight monocytosis but otherwise normal. He is at creatinine was 1.5 with a creatinine clearance of less than 50 mL/m. Leucovorin back at his CBC for the last 10 years which showed similar findings. His CBC on 08/07/2007 showed a white cell count of 3.0 and platelet count of 110. His hemoglobin was 12.1 with slight monocytosis. These numbers have fluctuated throughout the years with slightly drop in his hemoglobin over the years.  Clinically he is asymptomatic with these findings. He denied any recent fevers or chills or sweats. He did have some weight loss which is improving at this time. He does not report any easy bruising or bleeding. His quality of life is not dramatically changed. He does drive short distances and ambulates with the help of walker.  He does not report any headaches, blurry vision, syncope or seizures. He does not report any fevers chills or sweats. He does not report any chest pain, palpitation orthopnea or leg  edema. He does not report any cough, wheezing or hemoptysis. He does not report any nausea, vomiting or abdominal pain. He does not report any frequency urgency or hesitancy. His hematuria has subsided. Does not report a skeletal complaints. Remaining review of systems unremarkable.   Past Medical History:  Diagnosis Date  . Anemia   . CKD (chronic kidney disease), stage II   . 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 History of adenomatous polyp of colon    hyperplastic  2013  . History of kidney stones   . History of transient ischemic attack (TIA)    01-07-2012  . Hyperlipidemia   . Hypertension   . Hypothyroidism   . Impairment of balance   . 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 OA (osteoarthritis)   . Pancytopenia ([place])   . Parkinsonism ([place])    UNSPECIFIED type --  NEUROLOGIST-- DR Star Age  . Splenomegaly   . Ureteral tumor    left upper tract  . Wears dentures    full upper and partial lower  . Wears glasses   . Wears hearing aid    bilateral hears ---  very hoh over phone  :  Past Surgical History:  Procedure Laterality Date  . ANTERIOR CERVICAL DECOMP/DISCECTOMY FUSION  06/04/2008   C4 -- C7  . CATARACT EXTRACTION W/ INTRAOCULAR LENS  IMPLANT, BILATERAL  2016  . COLONOSCOPY  last one 07-26-2012  . CYSTOSCOPY WITH RETROGRADE PYELOGRAM, URETEROSCOPY AND STENT PLACEMENT Left 08/10/2016   Procedure: CYSTOSCOPY WITH  LEFT  RETROGRADE PYELOGRAM, URETEROSCOPY URETERAL BIOPSY,  AND STENT PLACEMENT;  Surgeon: Ardis Hughs, MD;  Location: [facility];  Service: Urology;  Laterality: Left;  . CYSTOSCOPY/RETROGRADE/URETEROSCOPY/STONE EXTRACTION WITH BASKET  1987  . INGUINAL HERNIA REPAIR Right 1980's  . TRANSTHORACIC ECHOCARDIOGRAM  01/09/2012   mild focal basal LVH,  ef 55-60%,  grade 1 diastolic dysfunction/  mild AV stenosis , no regurg. , valve area 2.13cm/m^2 /  atrial septum lipomatous hypertrophy/  mild TR   :   Current Outpatient Prescriptions:  .  acetaminophen (TYLENOL ARTHRITIS PAIN) 650 MG CR tablet, Take 650 mg by mouth 3 (three) times a week., Disp: , Rfl:  .  amLODipine (NORVASC) 10 MG tablet, Take 10 mg by mouth every morning. , Disp: , Rfl:  .  aspirin EC 325 MG EC tablet, Take 1 tablet (325 mg total) by mouth daily., Disp: 30 tablet, Rfl: 0 .  atorvastatin (LIPITOR) 10 MG tablet, Take 10 mg by mouth every morning. , Disp: , Rfl:  .  carbidopa-levodopa (SINEMET IR) 25-100 MG tablet, Take 1 tablet by mouth 3 (three) times daily., Disp: 90 tablet, Rfl: 5 .  Cyanocobalamin (B-12) 1000 MCG SUBL, Place 1 tablet under the tongue daily. , Disp: , Rfl:  .  docusate sodium (COLACE) 100 MG capsule, Take 100 mg by mouth every morning., Disp: , Rfl:  .  Ferrous Sulfate (IRON) 325 (65 Fe) MG TABS, Take 1 tablet by mouth 2 (two) times daily., Disp: , Rfl:  .  levothyroxine (SYNTHROID, LEVOTHROID) 112 MCG tablet, Take 112 mcg by mouth daily before breakfast. , Disp: , Rfl:  .  losartan (COZAAR) 25 MG tablet, Take 25 mg by mouth every morning., Disp: , Rfl:  .  Multiple Vitamin (MULTIVITAMIN) tablet, Take 1 tablet by mouth daily., Disp: , Rfl:  .  Omega-3 Fatty Acids (FISH OIL) 1000 MG CAPS, Take 3,000 mg by mouth 2 (two) times daily., Disp: , Rfl:  .  traMADol (ULTRAM) 50 MG tablet, Take 1-2 tablets (50-100 mg total) by mouth every 6 (six) hours as needed for moderate pain., Disp: 15 tablet, Rfl: 0:  Allergies  Allergen Reactions  . Niacin And Related Nausea And Vomiting and Other (See Comments)    And dizzy  . Phenergan [Promethazine Hcl] Other (See Comments)    hallucinations  . Valium [Diazepam] Other (See Comments)    Severe confusion  . Azithromycin Rash  :  Family History  Problem Relation Age of Onset  . Diabetes Sister   :  Social History   Social History  . Marital status: Single    Spouse name: N/A  . Number of children: 5  . Years of education: 12   Occupational History   . Retired     Social History Main Topics  . Smoking status: Never Smoker  . Smokeless tobacco: Never Used  . Alcohol use No  . Drug use: No  . Sexual activity: Not on file   Other Topics Concern  . Not on file   Social History Narrative   Denies caffeine use   :  Pertinent items are noted in HPI.  Exam: Blood pressure 137/64, pulse 79, temperature 97 F (36.1 C), temperature source Oral, resp. rate 17, height '5\' 10"'$  (1.778 m), weight 139 lb 12.8 oz (63.4 kg), SpO2 100 %.  ECOG 1 General appearance: alert and cooperative. Chronically ill-appearing without distress. Head: Normocephalic, without obvious abnormality Throat: lips, mucosa, and tongue normal; teeth and gums normal  no oral thrush or ulcers. Neck: no adenopathy Back: negative Resp: clear to auscultation bilaterally no wheezing or dullness to percussion. Cardio: regular rate and rhythm, S1, S2 normal, no murmur, click, rub or gallop GI: soft, non-tender; bowel sounds normal; no masses,  no organomegaly Extremities: extremities normal, atraumatic, no cyanosis or edema Pulses: 2+ and symmetric    CBC    Component Value Date/Time   WBC 3.5 (L) 07/12/2016 1420   RBC 3.38 (L) 07/12/2016 1420   HGB 9.8 (L) 08/10/2016 0701   HGB 12.8 (L) 02/19/2014 1219   HGB 12.0 (L) 11/04/2010 0852   HCT 29.2 (L) 07/12/2016 1420   HCT 38.0 (L) 02/19/2014 1219   HCT 34.3 (L) 11/04/2010 0852   PLT 103 (L) 07/12/2016 1420   PLT 119 (L) 02/19/2014 1219   PLT 107 (L) 11/04/2010 0852   MCV 86.4 07/12/2016 1420   MCV 89 02/19/2014 1219   MCV 88.6 11/04/2010 0852   MCH 29.0 07/12/2016 1420   MCHC 33.6 07/12/2016 1420   RDW 13.4 07/12/2016 1420   RDW 13.4 02/19/2014 1219   RDW 13.4 11/04/2010 0852   LYMPHSABS 1.2 07/12/2016 1420   LYMPHSABS 1.0 02/19/2014 1219   LYMPHSABS 0.8 (L) 11/04/2010 0852   MONOABS 0.6 07/12/2016 1420   MONOABS 0.6 11/04/2010 0852   EOSABS 0.0 07/12/2016 1420   EOSABS 0.1 02/19/2014 1219   BASOSABS 0.0  07/12/2016 1420   BASOSABS 0.0 02/19/2014 1219   BASOSABS 0.0 11/04/2010 0852     Assessment and Plan:    70 year old gentleman with the following issues:  1. Fluctuating pancytopenia that is mild and chronic in nature dating back to 2008. He does have an element of monocytosis associated with it. His white cell count have ranged between normal range and as low as 2.5. His platelet count has been close to 100,000. His most recent hemoglobin was close to 10.  The differential diagnosis was reviewed today with the patient and his wife. Early myelodysplastic syndrome as a possibility given his age. Reactive pancytopenia is also a possibility related to autoimmune disease. He does have osteoarthritis although it is unclear to me whether he has rheumatoid arthritis as well.  From a management standpoint, given the chronicity of these findings and the fact that they have fluctuated close to the same range for the last 10 years of recommended continued observation. He experiences further decline in his counts I would recommend a bone marrow biopsy for further investigation.  2. Worsening anemia: Appears to be normocytic, normochromic. Could be related to renal insufficiency as well as early myelodysplasia. We will repeat her laboratory testing including B12, erythropoietin as well as iron studies as a part of his workup. Growth factor support and possible bone marrow biopsy could be needed in the future.  Overall he is asymptomatic and his anemia could have been exacerbated because of recent hematuria. I will repeat his CBC in 3 months and continue to observe.  3. Hematuria: His workup did not reveal any malignancy continue to follow with Dr. Louis Meckel He status post cystoscopy and a biopsy.

## 2016-08-22 DIAGNOSIS — E611 Iron deficiency: Secondary | ICD-10-CM | POA: Diagnosis not present

## 2016-08-22 DIAGNOSIS — D649 Anemia, unspecified: Secondary | ICD-10-CM | POA: Diagnosis not present

## 2016-08-22 DIAGNOSIS — D61818 Other pancytopenia: Secondary | ICD-10-CM | POA: Diagnosis not present

## 2016-08-24 DIAGNOSIS — R310 Gross hematuria: Secondary | ICD-10-CM | POA: Diagnosis not present

## 2016-09-18 DIAGNOSIS — Z125 Encounter for screening for malignant neoplasm of prostate: Secondary | ICD-10-CM | POA: Diagnosis not present

## 2016-09-18 DIAGNOSIS — Z0000 Encounter for general adult medical examination without abnormal findings: Secondary | ICD-10-CM | POA: Diagnosis not present

## 2016-09-18 DIAGNOSIS — B369 Superficial mycosis, unspecified: Secondary | ICD-10-CM | POA: Diagnosis not present

## 2016-09-18 DIAGNOSIS — I10 Essential (primary) hypertension: Secondary | ICD-10-CM | POA: Diagnosis not present

## 2016-09-18 DIAGNOSIS — E7800 Pure hypercholesterolemia, unspecified: Secondary | ICD-10-CM | POA: Diagnosis not present

## 2016-09-18 DIAGNOSIS — E039 Hypothyroidism, unspecified: Secondary | ICD-10-CM | POA: Diagnosis not present

## 2016-09-18 DIAGNOSIS — D649 Anemia, unspecified: Secondary | ICD-10-CM | POA: Diagnosis not present

## 2016-09-21 DIAGNOSIS — I10 Essential (primary) hypertension: Secondary | ICD-10-CM | POA: Diagnosis not present

## 2016-09-21 DIAGNOSIS — B351 Tinea unguium: Secondary | ICD-10-CM | POA: Diagnosis not present

## 2016-09-21 DIAGNOSIS — R319 Hematuria, unspecified: Secondary | ICD-10-CM | POA: Diagnosis not present

## 2016-09-21 DIAGNOSIS — E7800 Pure hypercholesterolemia, unspecified: Secondary | ICD-10-CM | POA: Diagnosis not present

## 2016-09-21 DIAGNOSIS — Z1212 Encounter for screening for malignant neoplasm of rectum: Secondary | ICD-10-CM | POA: Diagnosis not present

## 2016-09-21 DIAGNOSIS — D61818 Other pancytopenia: Secondary | ICD-10-CM | POA: Diagnosis not present

## 2016-09-21 DIAGNOSIS — H6121 Impacted cerumen, right ear: Secondary | ICD-10-CM | POA: Diagnosis not present

## 2016-09-22 DIAGNOSIS — H6121 Impacted cerumen, right ear: Secondary | ICD-10-CM | POA: Diagnosis not present

## 2016-09-25 ENCOUNTER — Ambulatory Visit: Payer: Commercial Managed Care - HMO | Admitting: Neurology

## 2016-10-24 DIAGNOSIS — H6120 Impacted cerumen, unspecified ear: Secondary | ICD-10-CM | POA: Diagnosis not present

## 2016-10-24 DIAGNOSIS — R630 Anorexia: Secondary | ICD-10-CM | POA: Diagnosis not present

## 2016-10-24 DIAGNOSIS — E039 Hypothyroidism, unspecified: Secondary | ICD-10-CM | POA: Diagnosis not present

## 2016-10-24 DIAGNOSIS — I10 Essential (primary) hypertension: Secondary | ICD-10-CM | POA: Diagnosis not present

## 2016-10-24 DIAGNOSIS — R21 Rash and other nonspecific skin eruption: Secondary | ICD-10-CM | POA: Diagnosis not present

## 2016-11-03 ENCOUNTER — Other Ambulatory Visit (HOSPITAL_BASED_OUTPATIENT_CLINIC_OR_DEPARTMENT_OTHER): Payer: Commercial Managed Care - HMO

## 2016-11-03 DIAGNOSIS — D649 Anemia, unspecified: Secondary | ICD-10-CM | POA: Diagnosis not present

## 2016-11-03 LAB — CBC WITH DIFFERENTIAL/PLATELET
BASO%: 0.3 % (ref 0.0–2.0)
Basophils Absolute: 0 10*3/uL (ref 0.0–0.1)
EOS ABS: 0.1 10*3/uL (ref 0.0–0.5)
EOS%: 1.6 % (ref 0.0–7.0)
HCT: 30.8 % — ABNORMAL LOW (ref 38.4–49.9)
HGB: 10.4 g/dL — ABNORMAL LOW (ref 13.0–17.1)
LYMPH%: 26.9 % (ref 14.0–49.0)
MCH: 28.6 pg (ref 27.2–33.4)
MCHC: 33.8 g/dL (ref 32.0–36.0)
MCV: 84.6 fL (ref 79.3–98.0)
MONO#: 0.5 10*3/uL (ref 0.1–0.9)
MONO%: 14.6 % — ABNORMAL HIGH (ref 0.0–14.0)
NEUT#: 1.8 10*3/uL (ref 1.5–6.5)
NEUT%: 56.6 % (ref 39.0–75.0)
PLATELETS: 74 10*3/uL — AB (ref 140–400)
RBC: 3.64 10*6/uL — AB (ref 4.20–5.82)
RDW: 13.8 % (ref 11.0–14.6)
WBC: 3.2 10*3/uL — ABNORMAL LOW (ref 4.0–10.3)
lymph#: 0.9 10*3/uL (ref 0.9–3.3)

## 2016-11-03 LAB — IRON AND TIBC
%SAT: 25 % (ref 20–55)
Iron: 71 ug/dL (ref 42–163)
TIBC: 280 ug/dL (ref 202–409)
UIBC: 209 ug/dL (ref 117–376)

## 2016-11-03 LAB — CHCC SMEAR

## 2016-11-03 LAB — COMPREHENSIVE METABOLIC PANEL
ALT: 19 U/L (ref 0–55)
ANION GAP: 10 meq/L (ref 3–11)
AST: 32 U/L (ref 5–34)
Albumin: 4 g/dL (ref 3.5–5.0)
Alkaline Phosphatase: 57 U/L (ref 40–150)
BILIRUBIN TOTAL: 0.45 mg/dL (ref 0.20–1.20)
BUN: 18.8 mg/dL (ref 7.0–26.0)
CHLORIDE: 109 meq/L (ref 98–109)
CO2: 22 meq/L (ref 22–29)
Calcium: 9.1 mg/dL (ref 8.4–10.4)
Creatinine: 1.3 mg/dL (ref 0.7–1.3)
EGFR: 55 mL/min/{1.73_m2} — AB (ref >90)
Glucose: 88 mg/dl (ref 70–140)
POTASSIUM: 4.1 meq/L (ref 3.5–5.1)
Sodium: 141 mEq/L (ref 136–145)
Total Protein: 6.5 g/dL (ref 6.4–8.3)

## 2016-11-04 LAB — ERYTHROPOIETIN: Erythropoietin: 10.8 m[IU]/mL (ref 2.6–18.5)

## 2016-11-04 LAB — VITAMIN B12

## 2016-11-08 LAB — MULTIPLE MYELOMA PANEL, SERUM
ALBUMIN/GLOB SERPL: 2.1 — AB (ref 0.7–1.7)
ALPHA 1: 0.2 g/dL (ref 0.0–0.4)
ALPHA2 GLOB SERPL ELPH-MCNC: 0.8 g/dL (ref 0.4–1.0)
Albumin SerPl Elph-Mcnc: 4.2 g/dL (ref 2.9–4.4)
B-GLOBULIN SERPL ELPH-MCNC: 0.7 g/dL (ref 0.7–1.3)
GAMMA GLOB SERPL ELPH-MCNC: 0.4 g/dL (ref 0.4–1.8)
GLOBULIN, TOTAL: 2.1 g/dL — AB (ref 2.2–3.9)
IGA/IMMUNOGLOBULIN A, SERUM: 59 mg/dL — AB (ref 61–437)
IgG, Qn, Serum: 457 mg/dL — ABNORMAL LOW (ref 700–1600)
IgM, Qn, Serum: 63 mg/dL (ref 15–143)
Total Protein: 6.3 g/dL (ref 6.0–8.5)

## 2016-11-09 ENCOUNTER — Ambulatory Visit (HOSPITAL_BASED_OUTPATIENT_CLINIC_OR_DEPARTMENT_OTHER): Payer: Commercial Managed Care - HMO | Admitting: Oncology

## 2016-11-09 ENCOUNTER — Telehealth: Payer: Self-pay | Admitting: Oncology

## 2016-11-09 VITALS — BP 155/62 | HR 89 | Temp 97.5°F | Resp 16 | Ht 70.0 in | Wt 143.4 lb

## 2016-11-09 DIAGNOSIS — D649 Anemia, unspecified: Secondary | ICD-10-CM | POA: Diagnosis not present

## 2016-11-09 DIAGNOSIS — D61818 Other pancytopenia: Secondary | ICD-10-CM | POA: Diagnosis not present

## 2016-11-09 NOTE — Telephone Encounter (Signed)
Appointments scheduled per 12/7 LOS. Patient given AVS report and calendars with future scheduled appointments.  °

## 2016-11-09 NOTE — Progress Notes (Signed)
Hematology and Oncology Follow Up Visit  Kyle Espinoza [id number] 02-22-1946 70 y.o. 11/09/2016 12:17 PM Kyle Espinoza, MDPharr, Thayer Jew, MD   Principle Diagnosis: 70 year old gentleman with pancytopenia and monocytosis. 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  Current therapy: Observation and surveillance.  Interim History: Kyle Espinoza presents today for a follow-up visit. He is a pleasant, frail gentleman with asymptomatic mild pancytopenia. Since the last visit, he does not report any major changes in his health. He did develop gross hematuria and underwent a cystoscopy and a biopsy which showed no evidence of malignancy. This was performed on 08/10/2016 by Dr. Louis Espinoza. He reports no complaints at this time. His appetite is much improved and have gained more weight. He denied any chest pain or difficulty breathing. He denied any ecchymosis or easy bruisability. He does not report any lymphadenopathy. He does take oral supplement of iron which is not bothersome to him.  He does not report any headaches, blurry vision, syncope or seizures. He does not report any fevers chills or sweats. He does not report any chest pain, palpitation orthopnea or leg edema. He does not report any cough, wheezing or hemoptysis. He does not report any nausea, vomiting or abdominal pain. He does not report any frequency urgency or hesitancy. His hematuria has subsided. Does not report a skeletal complaints. Remaining review of systems unremarkable  Medications: I have reviewed the patient's current medications.  Current Outpatient Prescriptions  Medication Sig Dispense Refill  . acetaminophen (TYLENOL ARTHRITIS PAIN) 650 MG CR tablet Take 650 mg by mouth 3 (three) times a week.    Marland Kitchen amLODipine (NORVASC) 10 MG tablet Take 10 mg by mouth every morning.     Marland Kitchen aspirin EC 325 MG EC tablet Take 1 tablet (325 mg total) by mouth daily. 30 tablet 0  . atorvastatin (LIPITOR) 10 MG tablet  Take 10 mg by mouth every morning.     . carbidopa-levodopa (SINEMET IR) 25-100 MG tablet Take 1 tablet by mouth 3 (three) times daily. 90 tablet 5  . Cyanocobalamin (B-12) 1000 MCG SUBL Place 1 tablet under the tongue daily.     Marland Kitchen docusate sodium (COLACE) 100 MG capsule Take 100 mg by mouth every morning.    . Ferrous Sulfate (IRON) 325 (65 Fe) MG TABS Take 1 tablet by mouth 2 (two) times daily.    Marland Kitchen levothyroxine (SYNTHROID, LEVOTHROID) 112 MCG tablet Take 112 mcg by mouth daily before breakfast.     . losartan (COZAAR) 25 MG tablet Take 25 mg by mouth every morning.    . Multiple Vitamin (MULTIVITAMIN) tablet Take 1 tablet by mouth daily.    . Omega-3 Fatty Acids (FISH OIL) 1000 MG CAPS Take 3,000 mg by mouth 2 (two) times daily.    . traMADol (ULTRAM) 50 MG tablet Take 1-2 tablets (50-100 mg total) by mouth every 6 (six) hours as needed for moderate pain. 15 tablet 0   No current facility-administered medications for this visit.      Allergies:  Allergies  Allergen Reactions  . Niacin And Related Nausea And Vomiting and Other (See Comments)    And dizzy  . Phenergan [Promethazine Hcl] Other (See Comments)    hallucinations  . Valium [Diazepam] Other (See Comments)    Severe confusion  . Azithromycin Rash    Past Medical History, Surgical history, Social history, and Family History were reviewed and updated.   Physical Exam: Blood pressure (!) 155/62, pulse 89, temperature 97.5 F (36.4 C), temperature  source Oral, resp. rate 16, height '5\' 10"'$  (1.778 m), weight 143 lb 6.4 oz (65 kg), SpO2 96 %. ECOG: 2 General appearance: alert and cooperative Head: Normocephalic, without obvious abnormality Neck: no adenopathy Lymph nodes: Cervical, supraclavicular, and axillary nodes normal. Heart:regular rate and rhythm, S1, S2 normal, no murmur, click, rub or gallop Lung:chest clear, no wheezing, rales, normal symmetric air entry.  Abdomin: soft, non-tender, without masses or  organomegaly EXT:no erythema, induration, or nodules. Arthritis noted   Lab Results: Lab Results  Component Value Date   WBC 3.2 (L) 11/03/2016   HGB 10.4 (L) 11/03/2016   HCT 30.8 (L) 11/03/2016   MCV 84.6 11/03/2016   PLT 74 (L) 11/03/2016     Chemistry      Component Value Date/Time   NA 141 11/03/2016 1123   K 4.1 11/03/2016 1123   CL 105 07/12/2016 1420   CO2 22 11/03/2016 1123   BUN 18.8 11/03/2016 1123   CREATININE 1.3 11/03/2016 1123      Component Value Date/Time   CALCIUM 9.1 11/03/2016 1123   ALKPHOS 57 11/03/2016 1123   AST 32 11/03/2016 1123   ALT 19 11/03/2016 1123   BILITOT 0.45 11/03/2016 1123      Impression and Plan:   70 year old gentleman with the following issues:  1. Fluctuating pancytopenia that is mild and chronic in nature dating back to 2008. He does have an element of monocytosis associated with it. His white cell count have ranged between normal range and as low as 2.5. His platelet count has been close to 100,000. His most recent hemoglobin was close to 10.  His most recent CBC on 11/03/2016 did not show any major changes. We have discussed the risks and benefits of proceeding with a bone marrow biopsy at this time for full characterization of these findings. He deferred this option for the time being and is he is asymptomatic. I think is reasonable to continue to monitor him at this time given his mild pancytopenia and the fact that he is asymptomatic. He is a marginal candidate for most therapies in any case. We will repeat CBC and 3 months. He understands that a bone marrow biopsy might be needed if he develops worsening cytopenias.  2. Mild anemia: Chronic in nature and his workup is unrevealing. His serum protein electrophoresis as well as vitamin B12 level are within normal range. His iron level also within normal range. He is take an oral supplement of iron at this time which I have encouraged him to decrease once a day.  3. Follow-up:  Will be in 3 months sooner if needed to.   HTDSKA,JGOTL, MD 12/7/201712:17 PM

## 2016-11-14 DIAGNOSIS — E039 Hypothyroidism, unspecified: Secondary | ICD-10-CM | POA: Diagnosis not present

## 2016-11-23 DIAGNOSIS — M25561 Pain in right knee: Secondary | ICD-10-CM | POA: Diagnosis not present

## 2016-11-23 DIAGNOSIS — L089 Local infection of the skin and subcutaneous tissue, unspecified: Secondary | ICD-10-CM | POA: Diagnosis not present

## 2016-11-23 DIAGNOSIS — M25562 Pain in left knee: Secondary | ICD-10-CM | POA: Diagnosis not present

## 2016-11-23 DIAGNOSIS — D72819 Decreased white blood cell count, unspecified: Secondary | ICD-10-CM | POA: Diagnosis not present

## 2016-11-23 DIAGNOSIS — L720 Epidermal cyst: Secondary | ICD-10-CM | POA: Diagnosis not present

## 2016-11-23 DIAGNOSIS — D61818 Other pancytopenia: Secondary | ICD-10-CM | POA: Diagnosis not present

## 2016-11-23 DIAGNOSIS — G8929 Other chronic pain: Secondary | ICD-10-CM | POA: Diagnosis not present

## 2016-11-24 DIAGNOSIS — L723 Sebaceous cyst: Secondary | ICD-10-CM | POA: Diagnosis not present

## 2016-12-06 DIAGNOSIS — H26493 Other secondary cataract, bilateral: Secondary | ICD-10-CM | POA: Diagnosis not present

## 2016-12-06 DIAGNOSIS — H524 Presbyopia: Secondary | ICD-10-CM | POA: Diagnosis not present

## 2016-12-06 DIAGNOSIS — H527 Unspecified disorder of refraction: Secondary | ICD-10-CM | POA: Diagnosis not present

## 2016-12-07 DIAGNOSIS — M150 Primary generalized (osteo)arthritis: Secondary | ICD-10-CM | POA: Diagnosis not present

## 2016-12-07 DIAGNOSIS — M25569 Pain in unspecified knee: Secondary | ICD-10-CM | POA: Diagnosis not present

## 2016-12-07 DIAGNOSIS — M25561 Pain in right knee: Secondary | ICD-10-CM | POA: Diagnosis not present

## 2016-12-14 DIAGNOSIS — L723 Sebaceous cyst: Secondary | ICD-10-CM | POA: Diagnosis not present

## 2016-12-15 DIAGNOSIS — H52209 Unspecified astigmatism, unspecified eye: Secondary | ICD-10-CM | POA: Diagnosis not present

## 2016-12-15 DIAGNOSIS — H524 Presbyopia: Secondary | ICD-10-CM | POA: Diagnosis not present

## 2016-12-15 DIAGNOSIS — H5213 Myopia, bilateral: Secondary | ICD-10-CM | POA: Diagnosis not present

## 2016-12-27 DIAGNOSIS — M170 Bilateral primary osteoarthritis of knee: Secondary | ICD-10-CM | POA: Diagnosis not present

## 2016-12-27 DIAGNOSIS — M25561 Pain in right knee: Secondary | ICD-10-CM | POA: Diagnosis not present

## 2016-12-27 DIAGNOSIS — M150 Primary generalized (osteo)arthritis: Secondary | ICD-10-CM | POA: Diagnosis not present

## 2016-12-27 DIAGNOSIS — E039 Hypothyroidism, unspecified: Secondary | ICD-10-CM | POA: Diagnosis not present

## 2016-12-27 DIAGNOSIS — M25569 Pain in unspecified knee: Secondary | ICD-10-CM | POA: Diagnosis not present

## 2017-03-09 ENCOUNTER — Other Ambulatory Visit (HOSPITAL_BASED_OUTPATIENT_CLINIC_OR_DEPARTMENT_OTHER): Payer: Medicare HMO

## 2017-03-09 ENCOUNTER — Ambulatory Visit (HOSPITAL_BASED_OUTPATIENT_CLINIC_OR_DEPARTMENT_OTHER): Payer: Medicare HMO | Admitting: Oncology

## 2017-03-09 VITALS — BP 123/58 | HR 84 | Temp 97.6°F | Resp 18 | Ht 70.0 in | Wt 132.9 lb

## 2017-03-09 DIAGNOSIS — D649 Anemia, unspecified: Secondary | ICD-10-CM

## 2017-03-09 DIAGNOSIS — D61818 Other pancytopenia: Secondary | ICD-10-CM

## 2017-03-09 DIAGNOSIS — D696 Thrombocytopenia, unspecified: Secondary | ICD-10-CM | POA: Diagnosis not present

## 2017-03-09 LAB — CBC WITH DIFFERENTIAL/PLATELET
BASO%: 0.2 % (ref 0.0–2.0)
BASOS ABS: 0 10*3/uL (ref 0.0–0.1)
EOS ABS: 0 10*3/uL (ref 0.0–0.5)
EOS%: 1 % (ref 0.0–7.0)
HEMATOCRIT: 34.5 % — AB (ref 38.4–49.9)
HEMOGLOBIN: 11.5 g/dL — AB (ref 13.0–17.1)
LYMPH#: 1 10*3/uL (ref 0.9–3.3)
LYMPH%: 21.1 % (ref 14.0–49.0)
MCH: 28.8 pg (ref 27.2–33.4)
MCHC: 33.4 g/dL (ref 32.0–36.0)
MCV: 86.3 fL (ref 79.3–98.0)
MONO#: 0.5 10*3/uL (ref 0.1–0.9)
MONO%: 10.1 % (ref 0.0–14.0)
NEUT#: 3.2 10*3/uL (ref 1.5–6.5)
NEUT%: 67.6 % (ref 39.0–75.0)
PLATELETS: 87 10*3/uL — AB (ref 140–400)
RBC: 4 10*6/uL — ABNORMAL LOW (ref 4.20–5.82)
RDW: 15 % — ABNORMAL HIGH (ref 11.0–14.6)
WBC: 4.8 10*3/uL (ref 4.0–10.3)

## 2017-03-09 NOTE — Progress Notes (Signed)
Hematology and Oncology Follow Up Visit  TRAVAS SCHEXNAYDER [id number] 07-12-1946 71 y.o. 03/09/2017 2:30 PM Horatio Pel, MDPharr, Thayer Jew, MD   Principle Diagnosis: 71 year old gentleman with thrombocytopenia and monocytosis. These findings suggest reactive findings rather than early myelodysplasia.  Current therapy: Observation and surveillance.  Interim History: Mr. Radu presents today for a follow-up visit. Since the last visit, he does not report no recent complaints. He denied any chest pain or difficulty breathing. He denied any ecchymosis or easy bruisability. He does not report any lymphadenopathy. He does take oral supplement of iron without any major complications. His performance status, quality of life and activity level is unchanged. He denied any hematochezia or melena. He denied any constitutional symptoms.  He does not report any headaches, blurry vision, syncope or seizures. He does not report any fevers chills or sweats. He does not report any chest pain, palpitation orthopnea or leg edema. He does not report any cough, wheezing or hemoptysis. He does not report any nausea, vomiting or abdominal pain. He does not report any frequency urgency or hesitancy. His hematuria has subsided. Does not report a skeletal complaints. Remaining review of systems unremarkable  Medications: I have reviewed the patient's current medications.  Current Outpatient Prescriptions  Medication Sig Dispense Refill  . acetaminophen (TYLENOL ARTHRITIS PAIN) 650 MG CR tablet Take 650 mg by mouth 3 (three) times a week.    Marland Kitchen amLODipine (NORVASC) 10 MG tablet Take 10 mg by mouth every morning.     Marland Kitchen aspirin EC 325 MG EC tablet Take 1 tablet (325 mg total) by mouth daily. 30 tablet 0  . atorvastatin (LIPITOR) 10 MG tablet Take 10 mg by mouth every morning.     . carbidopa-levodopa (SINEMET IR) 25-100 MG tablet Take 1 tablet by mouth 3 (three) times daily. 90 tablet 5  . Cyanocobalamin (B-12) 1000 MCG SUBL  Place 1 tablet under the tongue daily.     Marland Kitchen docusate sodium (COLACE) 100 MG capsule Take 100 mg by mouth every morning.    . Ferrous Sulfate (IRON) 325 (65 Fe) MG TABS Take 1 tablet by mouth 2 (two) times daily.    Marland Kitchen levothyroxine (SYNTHROID, LEVOTHROID) 112 MCG tablet Take 112 mcg by mouth daily before breakfast.     . losartan (COZAAR) 25 MG tablet Take 25 mg by mouth every morning.    . Multiple Vitamin (MULTIVITAMIN) tablet Take 1 tablet by mouth daily.    . Omega-3 Fatty Acids (FISH OIL) 1000 MG CAPS Take 3,000 mg by mouth 2 (two) times daily.    . traMADol (ULTRAM) 50 MG tablet Take 1-2 tablets (50-100 mg total) by mouth every 6 (six) hours as needed for moderate pain. 15 tablet 0   No current facility-administered medications for this visit.      Allergies:  Allergies  Allergen Reactions  . Niacin And Related Nausea And Vomiting and Other (See Comments)    And dizzy  . Phenergan [Promethazine Hcl] Other (See Comments)    hallucinations  . Valium [Diazepam] Other (See Comments)    Severe confusion  . Azithromycin Rash    Past Medical History, Surgical history, Social history, and Family History were reviewed and updated.   Physical Exam: Blood pressure (!) 123/58, pulse 84, temperature 97.6 F (36.4 C), temperature source Oral, resp. rate 18, height 5\' 10"  (1.778 m), weight 132 lb 14.4 oz (60.3 kg), SpO2 100 %. ECOG: 2 General appearance: alert and cooperative appeared without distress. Head: Normocephalic, without obvious abnormality no  oral ulcers or lesions. Neck: no adenopathy Lymph nodes: Cervical, supraclavicular, and axillary nodes normal. Heart:regular rate and rhythm, S1, S2 normal, no murmur, click, rub or gallop Lung:chest clear, no wheezing, rales, normal symmetric air entry.  Abdomin: soft, non-tender, without masses or organomegaly without shifting dullness or ascites. EXT:no erythema, induration, or nodules. Arthritis noted   Lab Results: Lab Results   Component Value Date   WBC 4.8 03/09/2017   HGB 11.5 (L) 03/09/2017   HCT 34.5 (L) 03/09/2017   MCV 86.3 03/09/2017   PLT 87 (L) 03/09/2017     Chemistry      Component Value Date/Time   NA 141 11/03/2016 1123   K 4.1 11/03/2016 1123   CL 105 07/12/2016 1420   CO2 22 11/03/2016 1123   BUN 18.8 11/03/2016 1123   CREATININE 1.3 11/03/2016 1123      Component Value Date/Time   CALCIUM 9.1 11/03/2016 1123   ALKPHOS 57 11/03/2016 1123   AST 32 11/03/2016 1123   ALT 19 11/03/2016 1123   BILITOT 0.45 11/03/2016 1123      Results for JEREK, MEULEMANS (MRN [id number]) as of 03/09/2017 14:24  Ref. Range 11/03/2016 11:23  Albumin/Glob SerPl Latest Ref Range: 0.7 - 1.7  2.1 (H)  Alpha2 Glob SerPl Elph-Mcnc Latest Ref Range: 0.4 - 1.0 g/dL 0.8  Alpha 1 Latest Ref Range: 0.0 - 0.4 g/dL 0.2  Gamma Glob SerPl Elph-Mcnc Latest Ref Range: 0.4 - 1.8 g/dL 0.4  IFE 1 Unknown Comment  Globulin, Total Latest Ref Range: 2.2 - 3.9 g/dL 2.1 (L)  B-Globulin SerPl Elph-Mcnc Latest Ref Range: 0.7 - 1.3 g/dL 0.7  IgG (Immunoglobin G), Serum Latest Ref Range: 700 - 1,600 mg/dL 457 (L)  IgM, Qn, Serum Latest Ref Range: 15 - 143 mg/dL 63  M Protein SerPl Elph-Mcnc Latest Ref Range: Not Observed g/dL Not Observed     Impression and Plan:   71 year old gentleman with the following issues:  1. Fluctuating pancytopenia that is mild and chronic in nature dating back to 2008. He has more thrombocytopenia that is rather mild and chronic in nature and have been fluctuating is well.  His CBC on 03/09/2017 was personally reviewed and showed normal white cell count and differential. His hemoglobin is improving and close to normal range. His platelet count is close to his baseline which has been fluctuating since 2008.  These findings suggest reactive etiology and do not require any intervention at this time. I recommended observation and surveillance and will reevaluate him if he develops worsening  cytopenias.   2. Mild anemia: Chronic in nature and his workup is unrevealing. His serum protein electrophoresis as well as vitamin B12 level are within normal range. His iron level also within normal range. He is take an oral supplement which has improved his hemoglobin and approaching his baseline. I recommend continuing oral iron supplements at this time.  3. Follow-up: I'm happy to see him in the future as needed if his platelet count drops below 50,000 for reevaluation.   Zola Button, MD 4/6/20182:30 PM

## 2017-03-15 DIAGNOSIS — L723 Sebaceous cyst: Secondary | ICD-10-CM | POA: Diagnosis not present

## 2017-03-22 DIAGNOSIS — M539 Dorsopathy, unspecified: Secondary | ICD-10-CM | POA: Diagnosis not present

## 2017-03-22 DIAGNOSIS — L720 Epidermal cyst: Secondary | ICD-10-CM | POA: Diagnosis not present

## 2017-03-22 DIAGNOSIS — G8929 Other chronic pain: Secondary | ICD-10-CM | POA: Diagnosis not present

## 2017-03-22 DIAGNOSIS — Z8619 Personal history of other infectious and parasitic diseases: Secondary | ICD-10-CM | POA: Diagnosis not present

## 2017-04-27 ENCOUNTER — Encounter: Payer: Self-pay | Admitting: Gastroenterology

## 2017-05-10 DIAGNOSIS — L723 Sebaceous cyst: Secondary | ICD-10-CM | POA: Diagnosis not present

## 2017-06-01 DIAGNOSIS — R29818 Other symptoms and signs involving the nervous system: Secondary | ICD-10-CM | POA: Diagnosis not present

## 2017-06-01 DIAGNOSIS — R634 Abnormal weight loss: Secondary | ICD-10-CM | POA: Diagnosis not present

## 2017-06-01 DIAGNOSIS — R630 Anorexia: Secondary | ICD-10-CM | POA: Diagnosis not present

## 2017-06-01 DIAGNOSIS — R4189 Other symptoms and signs involving cognitive functions and awareness: Secondary | ICD-10-CM | POA: Diagnosis not present

## 2017-06-04 DIAGNOSIS — R4189 Other symptoms and signs involving cognitive functions and awareness: Secondary | ICD-10-CM | POA: Diagnosis not present

## 2017-06-04 DIAGNOSIS — I10 Essential (primary) hypertension: Secondary | ICD-10-CM | POA: Diagnosis not present

## 2017-06-04 DIAGNOSIS — D500 Iron deficiency anemia secondary to blood loss (chronic): Secondary | ICD-10-CM | POA: Diagnosis not present

## 2017-06-04 DIAGNOSIS — E039 Hypothyroidism, unspecified: Secondary | ICD-10-CM | POA: Diagnosis not present

## 2017-06-05 DIAGNOSIS — R4189 Other symptoms and signs involving cognitive functions and awareness: Secondary | ICD-10-CM | POA: Diagnosis not present

## 2017-06-29 ENCOUNTER — Other Ambulatory Visit: Payer: Self-pay | Admitting: Surgery

## 2017-06-29 DIAGNOSIS — L723 Sebaceous cyst: Secondary | ICD-10-CM | POA: Diagnosis not present

## 2017-06-29 DIAGNOSIS — L720 Epidermal cyst: Secondary | ICD-10-CM | POA: Diagnosis not present

## 2017-07-12 ENCOUNTER — Ambulatory Visit (INDEPENDENT_AMBULATORY_CARE_PROVIDER_SITE_OTHER): Payer: Medicare HMO | Admitting: Neurology

## 2017-07-12 ENCOUNTER — Encounter: Payer: Self-pay | Admitting: Neurology

## 2017-07-12 VITALS — BP 139/70 | HR 76 | Ht 71.0 in | Wt 118.0 lb

## 2017-07-12 DIAGNOSIS — R634 Abnormal weight loss: Secondary | ICD-10-CM | POA: Diagnosis not present

## 2017-07-12 DIAGNOSIS — R5381 Other malaise: Secondary | ICD-10-CM

## 2017-07-12 DIAGNOSIS — G20 Parkinson's disease: Secondary | ICD-10-CM

## 2017-07-12 DIAGNOSIS — R413 Other amnesia: Secondary | ICD-10-CM | POA: Diagnosis not present

## 2017-07-12 NOTE — Patient Instructions (Addendum)
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 This can affect your balance, most patients have a tendency to fall backwards, this can affect your swallowing, speech, your eye movements and can cause memory loss.  It is critical that you have supervision at home. Please use a cane or walker for safety while walking. Your biggest problem is deconditioning, loss of appetite and loss of weight. Please talked to Dr. Pennie Banter office about seeing a nutritionist to build up her strength in your appetite and eat enough high protein food. I would not recommend a medication for your memory loss for fear of side effects such as hallucinations.  Please also talked to Dr. Shelia Media about potentially working with palliative care.

## 2017-07-12 NOTE — Progress Notes (Signed)
Subjective:    Patient ID: Kyle Espinoza is a 71 y.o. male.  HPI     Interim history:   Mr. Kyle Espinoza is a 71 year old right-handed gentleman with an underlying medical history of thrombocytopenia, splenomegaly, hypertension, hyperlipidemia, hearing loss, thyroid disease, allergies, osteoarthritis, mild chronic kidney disease, status post neck surgery, kidney stone removal, and colonic polypectomy, who presents for a new problem of altered mental status and memory loss. He is referred by his primary care physician. He canceled an appointment for 09/25/2016 and did not reschedule. I have been seeing him for gait disorder and parkinsonism in the past. I last saw him on 03/29/2016 at which time he felt about the same. He was tolerating Sinemet 1 pill 3 times a day but noticed no improvement in his symptoms. He was using a cane to help his balance and insecurity while walking. He had no recent falls. His wife also did not notice much in the way of improvement of his gait after increasing the Sinemet.   Today, 07/12/2017 (all dictated new, as well as above notes, some dictation done in note pad or Word, outside of chart, may appear as copied):  He reports very little on his own and is very hard to understand. She has to give his history. In the past 2 months he has had some VH and paranoia, but is reassured by wife. He had a CTH wo contrast on 06/05/17, which I reviewed: IMPRESSION:  No acute intracranial abnormality.  He has lost his appetite, she has a very hard time to get him to eat. He does not typically choke on the food but does not like to eat. She tries to give him what he enjoys. He sometimes drinks hot chocolate but he has lost quite a bit of weight over the past few years. He has not recently had a fall, maybe in December but did not injure himself according to the wife. He uses a cane at the house.   The patient's allergies, current medications, family history, past medical history, past social  history, past surgical history and problem list were reviewed and updated as appropriate.   Previously (copied from previous notes for reference):   I saw him on 09/29/2015, at which time he reported doing about the same. Per wife, he was using his cane more consistently. Inside the house he does not use the cane. As far as the Sinemet goes, he was on half a pill 3 times a day and was tolerating it well. He had not noticed much in the way of improvement since taking it. He had intermittent nausea in the afternoon, he would not eat a full evening meal but eats a snack. He was not drinking enough water. For constipation he was on a stool softener. He is active physically. I asked him to increase the Sinemet to 1 pill tid.      I first met him on 06/29/2015 at the request of his primary care physician, at which time the patient reported difficulty with his gait and balance as well as a tendency to fall forward for at least 2 months. On examination, I felt he had mild signs of parkinsonism without overt lateralization. I suggested a trial of low-dose Sinemet starting with half pill twice daily and titration to have a pill 3 times a day after that. We considered a brain scan down the Road.   06/29/2015:   He reports difficulty with his gait and balance. He feels like he is going  to fall forward or backwards. Symptoms have been ongoing for about 2 months or a little longer. He denies tremors or lightheaded, no vertigo, no one sided weakness. 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 No acute intracranial abnormality.  No evidence of acute or chronic infarction. MRA HEAD: Negative.   He has not actually fallen, but feels like he could. He has been through PT outpatient and this has helped his walking. He has noted fine motor and  coordination problems. He is slower and has had constipation for the past 6 months. He has mild forgetfulness, no FHx of PD or parkisonism.   He has not been driving very much and his wife has taken over for the most part, in the last 6-12 months. He sleeps fairly well. He does not drink enough water.  His Past Medical History Is Significant For: Past Medical History:  Diagnosis Date  . Anemia   . CKD (chronic kidney disease), stage II   . 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 History of adenomatous polyp of colon    hyperplastic  2013  . History of kidney stones   . History of transient ischemic attack (TIA)    01-07-2012  . Hyperlipidemia   . Hypertension   . Hypothyroidism   . Impairment of balance   . 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 OA (osteoarthritis)   . Pancytopenia ([place])   . Parkinsonism ([place])    UNSPECIFIED type --  NEUROLOGIST-- DR Star Age  . Splenomegaly   . Ureteral tumor    left upper tract  . Wears dentures    full upper and partial lower  . Wears glasses   . Wears hearing aid    bilateral hears ---  very hoh over phone    His Past Surgical History Is Significant For: Past Surgical History:  Procedure Laterality Date  . ANTERIOR CERVICAL DECOMP/DISCECTOMY FUSION  06/04/2008   C4 -- C7  . CATARACT EXTRACTION W/ INTRAOCULAR LENS  IMPLANT, BILATERAL  2016  . COLONOSCOPY  last one 07-26-2012  . CYSTOSCOPY WITH RETROGRADE PYELOGRAM, URETEROSCOPY AND STENT PLACEMENT Left 08/10/2016   Procedure: CYSTOSCOPY WITH LEFT  RETROGRADE PYELOGRAM, URETEROSCOPY URETERAL BIOPSY,  AND STENT PLACEMENT;  Surgeon: Ardis Hughs, MD;  Location: [facility];  Service: Urology;  Laterality: Left;  . CYSTOSCOPY/RETROGRADE/URETEROSCOPY/STONE EXTRACTION WITH BASKET  1987  . INGUINAL HERNIA REPAIR Right 1980's  . TRANSTHORACIC ECHOCARDIOGRAM  01/09/2012   mild focal basal LVH,  ef 55-60%,  grade 1 diastolic  dysfunction/  mild AV stenosis , no regurg. , valve area 2.13cm/m^2 /  atrial septum lipomatous hypertrophy/  mild TR    His Family History Is Significant For: Family History  Problem Relation Age of Onset  . Diabetes Sister     His Social History Is Significant For: Social History   Social History  . Marital status: Single    Spouse name: N/A  . Number of children: 5  . Years of education: 12   Occupational History  . Retired     Social History Main Topics  . Smoking status: Never Smoker  . Smokeless tobacco: Never Used  . Alcohol use No  . Drug use: No  . Sexual activity:  Not Asked   Other Topics Concern  . None   Social History Narrative   Denies caffeine use     His Allergies Are:  Allergies  Allergen Reactions  . Niacin And Related Nausea And Vomiting and Other (See Comments)    And dizzy  . Phenergan [Promethazine Hcl] Other (See Comments)    hallucinations  . Valium [Diazepam] Other (See Comments)    Severe confusion  . Azithromycin Rash  :   His Current Medications Are:  Outpatient Encounter Prescriptions as of 07/12/2017  Medication Sig  . amLODipine (NORVASC) 10 MG tablet Take 10 mg by mouth every morning.   Marland Kitchen aspirin EC 325 MG EC tablet Take 1 tablet (325 mg total) by mouth daily.  Marland Kitchen atorvastatin (LIPITOR) 10 MG tablet Take 10 mg by mouth every morning.   . carbidopa-levodopa (SINEMET IR) 25-100 MG tablet Take 1 tablet by mouth 3 (three) times daily.  . Cyanocobalamin (B-12) 1000 MCG SUBL Place 1 tablet under the tongue daily.   Marland Kitchen docusate sodium (COLACE) 100 MG capsule Take 100 mg by mouth every morning.  . Ferrous Sulfate (IRON) 325 (65 Fe) MG TABS Take 1 tablet by mouth 2 (two) times daily.  Marland Kitchen levothyroxine (SYNTHROID, LEVOTHROID) 112 MCG tablet Take 112 mcg by mouth daily before breakfast.   . losartan (COZAAR) 25 MG tablet Take 25 mg by mouth every morning.  . Multiple Vitamin (MULTIVITAMIN) tablet Take 1 tablet by mouth daily.  . Omega-3  Fatty Acids (FISH OIL) 1000 MG CAPS Take 3,000 mg by mouth 2 (two) times daily.  . [DISCONTINUED] acetaminophen (TYLENOL ARTHRITIS PAIN) 650 MG CR tablet Take 650 mg by mouth 3 (three) times a week.  . [DISCONTINUED] traMADol (ULTRAM) 50 MG tablet Take 1-2 tablets (50-100 mg total) by mouth every 6 (six) hours as needed for moderate pain.   No facility-administered encounter medications on file as of 07/12/2017.   :  Review of Systems:  Out of a complete 14 point review of systems, all are reviewed and negative with the exception of these symptoms as listed below: Review of Systems  Neurological:       Pt presents today to discuss his lack of appetite and memory. Pt's wife reports that she "can't get him to eat."    Objective:  Neurological Exam  Physical Exam Physical Examination:   Vitals:   07/12/17 1445  BP: 139/70  Pulse: 76    General Examination: The patient is a very pleasant 71 y.o. male in no acute distress. He appears Frail and deconditioned. He is very quiet. He answers in full sentences but is very difficult to understand.  HEENT: Normocephalic, atraumatic, pupils are equal, round and reactive to light and accommodation. Extraocular tracking shows significant saccadic breakdown, no nystagmus, he has limitation to upper gaze. He has significant decrease in eye blink rate, hearing is impaired. He has moderate facial masking and mild drooling. Neck is severely rigid. Airway examination is difficult as he does not open his mouth fully. Severe dysarthria.  Chest: is clear to auscultation without wheezing, rhonchi or crackles noted.  Heart: sounds are regular and normal without murmurs, rubs or gallops noted.   Abdomen: is soft, non-tender and non-distended with normal bowel sounds appreciated on auscultation.  Extremities: There is no pitting edema in the distal lower extremities bilaterally. Pedal pulses are intact.   Skin: is warm and dry with no trophic changes  noted.    Musculoskeletal: exam reveals no obvious joint deformities, tenderness,  joint swelling or erythema.  Neurologically:  Mental status: The patient is awake and alert, paying good  attention. He is not able to provide his history very well. His wife provides most of his history.   On 07/12/2017: MMSE: 21 out of 30, clock drawing was 1.5 out of 4, AFT 11/m.  Speech is mildly hypophonic with mild dysarthria noted. Mood is congruent and affect is blunted.   Cranial nerves are as described above under HEENT exam.  Motor exam: Thin bulk, global strength of 4 out of 5, tone is mildly rigid in all 4 extremities, no resting tremor, no action tremor. Fine motor skills are mild to moderately impaired globally, he has moderate bradykinesia. He has no dyskinesias, no active hallucinations. Romberg is not tested safely. He stands up with mild difficulty.  Posture is moderately stooped. He walks slowly and insecurely, has a single-point walking stick. Sensory exam is intact to light touch , reflexes are 1+, absent in both ankles.  Assessment and Plan:   In summary, Vicki Pasqual Honeyman is a very pleasant 71 year old male with an underlying medical history of thrombocytopenia, splenomegaly, hypertension, hyperlipidemia, hearing loss, thyroid disease, allergies, osteoarthritis, mild chronic kidney disease, status post neck surgery, kidney stone removal, and colonic polypectomy, who presents for evaluation of his memory and overall declining function. I had seen him for parkinsonism, last seen in 4/17. He had no response to Sinemet at one pill 3 times a day and we tapered him off of it. He has lost a lot of weight and has lost appetite. 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 Exam shows severe dysarthria, and axial rigidity and parkinsonism without lateralization. He has memory loss. I would not recommend dementia medication for fear of side effects including hallucinations and balance problems. His  situation has certainly declined with time. His biggest issue right now is deconditioning, loss of appetite and significant loss of weight. I suggested they talk to Dr. Pennie Banter office about seeing a nutritionist and perhaps even getting plugged in with palliative care at this time. He is at the point of significant malnutrition I worry. I suggested they keep his follow-up with Dr. Shelia Media coming up in October or make a sooner appointment if the need arises. I answered all their questions today and the patient and his wife were in agreement.  I spent 25 minutes in total face-to-face time with the patient, more than 50% of which was spent in counseling and coordination of care, reviewing test results, reviewing medication and discussing or reviewing the diagnosis of atypical parkinsonism and physical deconditioning, the prognosis and treatment options. Pertinent laboratory and imaging test results that were available during this visit with the patient were reviewed by me and considered in my medical decision making (see chart for details).

## 2017-08-02 ENCOUNTER — Inpatient Hospital Stay (HOSPITAL_COMMUNITY)
Admission: EM | Admit: 2017-08-02 | Discharge: 2017-08-07 | DRG: 377 | Disposition: A | Discharge status: Hospice | Payer: Medicare HMO | Attending: Internal Medicine | Admitting: Internal Medicine

## 2017-08-02 ENCOUNTER — Emergency Department (HOSPITAL_COMMUNITY): Payer: Medicare HMO

## 2017-08-02 ENCOUNTER — Inpatient Hospital Stay (HOSPITAL_COMMUNITY): Payer: Medicare HMO

## 2017-08-02 ENCOUNTER — Encounter (HOSPITAL_COMMUNITY): Payer: Self-pay

## 2017-08-02 DIAGNOSIS — G20C Parkinsonism, unspecified: Secondary | ICD-10-CM

## 2017-08-02 DIAGNOSIS — K921 Melena: Secondary | ICD-10-CM | POA: Diagnosis not present

## 2017-08-02 DIAGNOSIS — I10 Essential (primary) hypertension: Secondary | ICD-10-CM

## 2017-08-02 DIAGNOSIS — R221 Localized swelling, mass and lump, neck: Secondary | ICD-10-CM | POA: Diagnosis not present

## 2017-08-02 DIAGNOSIS — I129 Hypertensive chronic kidney disease with stage 1 through stage 4 chronic kidney disease, or unspecified chronic kidney disease: Secondary | ICD-10-CM | POA: Diagnosis present

## 2017-08-02 DIAGNOSIS — R5381 Other malaise: Secondary | ICD-10-CM | POA: Diagnosis not present

## 2017-08-02 DIAGNOSIS — G20 Parkinson's disease: Secondary | ICD-10-CM | POA: Diagnosis not present

## 2017-08-02 DIAGNOSIS — Z8673 Personal history of transient ischemic attack (TIA), and cerebral infarction without residual deficits: Secondary | ICD-10-CM

## 2017-08-02 DIAGNOSIS — H548 Legal blindness, as defined in USA: Secondary | ICD-10-CM | POA: Diagnosis present

## 2017-08-02 DIAGNOSIS — D62 Acute posthemorrhagic anemia: Secondary | ICD-10-CM

## 2017-08-02 DIAGNOSIS — Z515 Encounter for palliative care: Secondary | ICD-10-CM | POA: Diagnosis present

## 2017-08-02 DIAGNOSIS — K068 Other specified disorders of gingiva and edentulous alveolar ridge: Secondary | ICD-10-CM | POA: Diagnosis present

## 2017-08-02 DIAGNOSIS — D696 Thrombocytopenia, unspecified: Secondary | ICD-10-CM | POA: Diagnosis not present

## 2017-08-02 DIAGNOSIS — R110 Nausea: Secondary | ICD-10-CM

## 2017-08-02 DIAGNOSIS — D61818 Other pancytopenia: Secondary | ICD-10-CM | POA: Diagnosis not present

## 2017-08-02 DIAGNOSIS — K922 Gastrointestinal hemorrhage, unspecified: Secondary | ICD-10-CM | POA: Diagnosis not present

## 2017-08-02 DIAGNOSIS — Z7982 Long term (current) use of aspirin: Secondary | ICD-10-CM | POA: Diagnosis not present

## 2017-08-02 DIAGNOSIS — G9340 Encephalopathy, unspecified: Secondary | ICD-10-CM | POA: Diagnosis not present

## 2017-08-02 DIAGNOSIS — R634 Abnormal weight loss: Secondary | ICD-10-CM | POA: Diagnosis not present

## 2017-08-02 DIAGNOSIS — R630 Anorexia: Secondary | ICD-10-CM | POA: Diagnosis not present

## 2017-08-02 DIAGNOSIS — N182 Chronic kidney disease, stage 2 (mild): Secondary | ICD-10-CM | POA: Diagnosis present

## 2017-08-02 DIAGNOSIS — E039 Hypothyroidism, unspecified: Secondary | ICD-10-CM | POA: Diagnosis present

## 2017-08-02 DIAGNOSIS — R918 Other nonspecific abnormal finding of lung field: Secondary | ICD-10-CM | POA: Diagnosis not present

## 2017-08-02 DIAGNOSIS — K8020 Calculus of gallbladder without cholecystitis without obstruction: Secondary | ICD-10-CM | POA: Diagnosis not present

## 2017-08-02 DIAGNOSIS — R531 Weakness: Secondary | ICD-10-CM | POA: Diagnosis not present

## 2017-08-02 DIAGNOSIS — D65 Disseminated intravascular coagulation [defibrination syndrome]: Secondary | ICD-10-CM | POA: Diagnosis present

## 2017-08-02 DIAGNOSIS — R161 Splenomegaly, not elsewhere classified: Secondary | ICD-10-CM

## 2017-08-02 DIAGNOSIS — E785 Hyperlipidemia, unspecified: Secondary | ICD-10-CM | POA: Diagnosis not present

## 2017-08-02 DIAGNOSIS — I2510 Atherosclerotic heart disease of native coronary artery without angina pectoris: Secondary | ICD-10-CM | POA: Diagnosis present

## 2017-08-02 DIAGNOSIS — K5710 Diverticulosis of small intestine without perforation or abscess without bleeding: Secondary | ICD-10-CM | POA: Diagnosis present

## 2017-08-02 DIAGNOSIS — N179 Acute kidney failure, unspecified: Secondary | ICD-10-CM | POA: Diagnosis not present

## 2017-08-02 DIAGNOSIS — Z66 Do not resuscitate: Secondary | ICD-10-CM | POA: Diagnosis present

## 2017-08-02 DIAGNOSIS — R1314 Dysphagia, pharyngoesophageal phase: Secondary | ICD-10-CM | POA: Diagnosis present

## 2017-08-02 DIAGNOSIS — N189 Chronic kidney disease, unspecified: Secondary | ICD-10-CM | POA: Diagnosis not present

## 2017-08-02 DIAGNOSIS — D649 Anemia, unspecified: Secondary | ICD-10-CM | POA: Diagnosis not present

## 2017-08-02 DIAGNOSIS — R4020 Unspecified coma: Secondary | ICD-10-CM | POA: Diagnosis not present

## 2017-08-02 DIAGNOSIS — Z833 Family history of diabetes mellitus: Secondary | ICD-10-CM

## 2017-08-02 DIAGNOSIS — R627 Adult failure to thrive: Secondary | ICD-10-CM | POA: Diagnosis present

## 2017-08-02 DIAGNOSIS — G459 Transient cerebral ischemic attack, unspecified: Secondary | ICD-10-CM | POA: Diagnosis present

## 2017-08-02 LAB — URINALYSIS, ROUTINE W REFLEX MICROSCOPIC
Bilirubin Urine: NEGATIVE
Glucose, UA: NEGATIVE mg/dL
Hgb urine dipstick: NEGATIVE
Ketones, ur: NEGATIVE mg/dL
Leukocytes, UA: NEGATIVE
Nitrite: NEGATIVE
Protein, ur: NEGATIVE mg/dL
Specific Gravity, Urine: 1.015 (ref 1.005–1.030)
pH: 6 (ref 5.0–8.0)

## 2017-08-02 LAB — CBC
HCT: 23.1 % — ABNORMAL LOW (ref 39.0–52.0)
Hemoglobin: 7.9 g/dL — ABNORMAL LOW (ref 13.0–17.0)
MCH: 29.2 pg (ref 26.0–34.0)
MCHC: 34.2 g/dL (ref 30.0–36.0)
MCV: 85.2 fL (ref 78.0–100.0)
Platelets: 113 10*3/uL — ABNORMAL LOW (ref 150–400)
RBC: 2.71 MIL/uL — ABNORMAL LOW (ref 4.22–5.81)
RDW: 15.4 % (ref 11.5–15.5)
WBC: 7.4 10*3/uL (ref 4.0–10.5)

## 2017-08-02 LAB — POC OCCULT BLOOD, ED: Fecal Occult Bld: POSITIVE — AB

## 2017-08-02 LAB — DIC (DISSEMINATED INTRAVASCULAR COAGULATION)PANEL
Fibrinogen: 136 mg/dL — ABNORMAL LOW (ref 210–475)
INR: 1.32
Prothrombin Time: 16.2 seconds — ABNORMAL HIGH (ref 11.4–15.2)
aPTT: 34 seconds (ref 24–36)

## 2017-08-02 LAB — IRON AND TIBC
Iron: 29 ug/dL — ABNORMAL LOW (ref 45–182)
SATURATION RATIOS: 15 % — AB (ref 17.9–39.5)
TIBC: 190 ug/dL — ABNORMAL LOW (ref 250–450)
UIBC: 161 ug/dL

## 2017-08-02 LAB — PROTIME-INR
INR: 1.27
Prothrombin Time: 15.8 seconds — ABNORMAL HIGH (ref 11.4–15.2)

## 2017-08-02 LAB — COMPREHENSIVE METABOLIC PANEL
ALT: 18 U/L (ref 17–63)
AST: 28 U/L (ref 15–41)
Albumin: 3.9 g/dL (ref 3.5–5.0)
Alkaline Phosphatase: 51 U/L (ref 38–126)
Anion gap: 11 (ref 5–15)
BUN: 43 mg/dL — ABNORMAL HIGH (ref 6–20)
CO2: 22 mmol/L (ref 22–32)
Calcium: 9.2 mg/dL (ref 8.9–10.3)
Chloride: 108 mmol/L (ref 101–111)
Creatinine, Ser: 1.6 mg/dL — ABNORMAL HIGH (ref 0.61–1.24)
GFR calc Af Amer: 48 mL/min — ABNORMAL LOW (ref >60)
GFR calc non Af Amer: 41 mL/min — ABNORMAL LOW (ref >60)
Glucose, Bld: 133 mg/dL — ABNORMAL HIGH (ref 65–99)
Potassium: 3.8 mmol/L (ref 3.5–5.1)
Sodium: 141 mmol/L (ref 135–145)
Total Bilirubin: 0.7 mg/dL (ref 0.3–1.2)
Total Protein: 6.1 g/dL — ABNORMAL LOW (ref 6.5–8.1)

## 2017-08-02 LAB — SAVE SMEAR

## 2017-08-02 LAB — FERRITIN: Ferritin: 735 ng/mL — ABNORMAL HIGH (ref 24–336)

## 2017-08-02 LAB — PREALBUMIN: Prealbumin: 19.7 mg/dL (ref 18–38)

## 2017-08-02 LAB — DIC (DISSEMINATED INTRAVASCULAR COAGULATION) PANEL: PLATELETS: 73 10*3/uL — AB (ref 150–400)

## 2017-08-02 LAB — PREPARE RBC (CROSSMATCH)

## 2017-08-02 LAB — AMMONIA: Ammonia: 10 umol/L (ref 9–35)

## 2017-08-02 LAB — HEMOGLOBIN AND HEMATOCRIT, BLOOD
HCT: 19.5 % — ABNORMAL LOW (ref 39.0–52.0)
HCT: 19.1 % — ABNORMAL LOW (ref 39.0–52.0)
Hemoglobin: 6.5 g/dL — CL (ref 13.0–17.0)
Hemoglobin: 6.3 g/dL — CL (ref 13.0–17.0)

## 2017-08-02 LAB — FOLATE: FOLATE: 21.9 ng/mL (ref >5.9)

## 2017-08-02 LAB — RETICULOCYTES
RBC.: 2.31 MIL/uL — AB (ref 4.22–5.81)
RETIC COUNT ABSOLUTE: 27.7 10*3/uL (ref 19.0–186.0)
RETIC CT PCT: 1.2 % (ref 0.4–3.1)

## 2017-08-02 LAB — APTT: APTT: 35 s (ref 24–36)

## 2017-08-02 LAB — TROPONIN I: Troponin I: <0.03 ng/mL (ref <0.03)

## 2017-08-02 LAB — TSH: TSH: 1.351 u[IU]/mL (ref 0.350–4.500)

## 2017-08-02 LAB — VITAMIN B12: VITAMIN B 12: 3381 pg/mL — AB (ref 180–914)

## 2017-08-02 LAB — ABO/RH: ABO/RH(D): O POS

## 2017-08-02 MED ORDER — SODIUM CHLORIDE 0.9 % IV SOLN
Freq: Once | INTRAVENOUS | Status: AC
  Administered 2017-08-03: via INTRAVENOUS

## 2017-08-02 MED ORDER — SODIUM CHLORIDE 0.9 % IV SOLN
INTRAVENOUS | Status: DC
  Administered 2017-08-02 (×2): via INTRAVENOUS
  Administered 2017-08-04: 1000 mL via INTRAVENOUS

## 2017-08-02 MED ORDER — SODIUM CHLORIDE 0.9 % IV SOLN
80.0000 mg | Freq: Once | INTRAVENOUS | Status: AC
  Administered 2017-08-02: 80 mg via INTRAVENOUS
  Filled 2017-08-02: qty 80

## 2017-08-02 MED ORDER — SODIUM CHLORIDE 0.9 % IV BOLUS (SEPSIS)
1000.0000 mL | Freq: Once | INTRAVENOUS | Status: AC
  Administered 2017-08-02: 1000 mL via INTRAVENOUS

## 2017-08-02 MED ORDER — ONE-DAILY MULTI VITAMINS PO TABS: 1.0000 | ORAL_TABLET | Freq: Every day | ORAL | Status: DC

## 2017-08-02 MED ORDER — SODIUM CHLORIDE 0.9 % IV SOLN
3.0000 g | Freq: Three times a day (TID) | INTRAVENOUS | Status: DC
  Administered 2017-08-02 – 2017-08-06 (×12): 3 g via INTRAVENOUS
  Filled 2017-08-02 (×13): qty 3

## 2017-08-02 MED ORDER — LEVOTHYROXINE SODIUM 112 MCG PO TABS
112.0000 ug | ORAL_TABLET | Freq: Every day | ORAL | Status: DC
Start: 2017-08-03 — End: 2017-08-04
  Administered 2017-08-03 – 2017-08-04 (×2): 112 ug via ORAL
  Filled 2017-08-02 (×2): qty 1

## 2017-08-02 MED ORDER — PANTOPRAZOLE SODIUM 40 MG IV SOLR: 40.0000 mg | Freq: Two times a day (BID) | INTRAVENOUS | Status: DC

## 2017-08-02 MED ORDER — IOPAMIDOL (ISOVUE-300) INJECTION 61%
INTRAVENOUS | Status: AC
  Filled 2017-08-02: qty 30

## 2017-08-02 MED ORDER — ATORVASTATIN CALCIUM 10 MG PO TABS
10.0000 mg | ORAL_TABLET | Freq: Every morning | ORAL | Status: DC
  Administered 2017-08-03 – 2017-08-04 (×2): 10 mg via ORAL
  Filled 2017-08-02 (×3): qty 1

## 2017-08-02 MED ORDER — SODIUM CHLORIDE 0.9 % IV SOLN
8.0000 mg/h | INTRAVENOUS | Status: DC
  Administered 2017-08-02: 8 mg/h via INTRAVENOUS
  Filled 2017-08-02: qty 80

## 2017-08-02 MED ORDER — IOHEXOL 300 MG/ML  SOLN: 15.0000 mL | INTRAMUSCULAR | Status: AC

## 2017-08-02 MED ORDER — FERROUS SULFATE 325 (65 FE) MG PO TABS
325.0000 mg | ORAL_TABLET | Freq: Two times a day (BID) | ORAL | Status: DC
  Administered 2017-08-03 – 2017-08-04 (×2): 325 mg via ORAL
  Filled 2017-08-02 (×3): qty 1

## 2017-08-02 MED ORDER — ACETAMINOPHEN 325 MG PO TABS: 650.0000 mg | ORAL_TABLET | Freq: Four times a day (QID) | ORAL | Status: DC | PRN

## 2017-08-02 MED ORDER — LOSARTAN POTASSIUM 50 MG PO TABS
25.0000 mg | ORAL_TABLET | Freq: Every morning | ORAL | Status: DC
  Administered 2017-08-03 – 2017-08-04 (×2): 25 mg via ORAL
  Filled 2017-08-02 (×2): qty 1

## 2017-08-02 MED ORDER — ADULT MULTIVITAMIN W/MINERALS CH
1.0000 | ORAL_TABLET | Freq: Every day | ORAL | Status: DC
  Administered 2017-08-04: 1 via ORAL
  Filled 2017-08-02 (×2): qty 1

## 2017-08-02 MED ORDER — ACETAMINOPHEN 650 MG RE SUPP: 650.0000 mg | Freq: Four times a day (QID) | RECTAL | Status: DC | PRN

## 2017-08-02 MED ORDER — SODIUM CHLORIDE 0.9 % IV SOLN
8.0000 mg/h | INTRAVENOUS | Status: DC
  Administered 2017-08-03: 8 mg/h via INTRAVENOUS
  Filled 2017-08-02 (×3): qty 80

## 2017-08-02 MED ORDER — AMLODIPINE BESYLATE 10 MG PO TABS
10.0000 mg | ORAL_TABLET | Freq: Every morning | ORAL | Status: DC
  Administered 2017-08-03 – 2017-08-04 (×2): 10 mg via ORAL
  Filled 2017-08-02 (×2): qty 1

## 2017-08-02 MED ORDER — VITAMIN B-12 1000 MCG PO TABS
1000.0000 ug | ORAL_TABLET | Freq: Every day | ORAL | Status: DC
  Administered 2017-08-04: 1000 ug via ORAL
  Filled 2017-08-02 (×2): qty 1

## 2017-08-02 MED ORDER — SENNOSIDES-DOCUSATE SODIUM 8.6-50 MG PO TABS: 1.0000 | ORAL_TABLET | Freq: Every evening | ORAL | Status: DC | PRN

## 2017-08-02 MED ORDER — PANTOPRAZOLE SODIUM 40 MG PO TBEC: 40.0000 mg | DELAYED_RELEASE_TABLET | Freq: Two times a day (BID) | ORAL | Status: DC

## 2017-08-02 NOTE — Consult Note (Signed)
[facility] Consult: 11:28 AM 08/02/2017  LOS: 0 days    Referring Provider: DR Aggie Moats in ED  Primary Care Physician:  Deland Pretty, MD Primary Gastroenterologist:  Dr. Ardis Hughs   Reason for Consultation:  Melena, anemia.  Oral bleeding. Anorexia. Weight loss.     HPI: Kyle Espinoza is a 71 y.o. male.  PMH mild splenomegaly predating ultrasound of 2014 which showed gallstones, mild splenomegaly, normal liver.  Thrombocytopenia dating to 2013 and platelets 74 in 11/2016.  Seen by Dr Marin Olp and in 2015, no further heme/onc investigation felt necessary.  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  Less than 50% bil carotid stenosis on dopplers 2014.  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 LVEF 60% and grade 1 diastolic dysfunction on 7425 Echo.  Anemia, normocytic 07/2016, on chronic twice a day iron for at least a year and a half..  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  Hearing loss.  Parkinsonism.  Hypothyroidism.  Surgeries for kidney stones, inguinal hernia repair, cervical spine.    2003 Colonoscopy by Dr Lajoyce Corners: Hyperlastic polyp.   07/2012 Colonoscopy by Dr Ardis Hughs. Avg risk screening.  Normal study.    Seen in the ED on 8/9 for hematuria and dark urine. He was on Cipro for UTI at the time. 07/10/2017 CT abd/pelvis w/o contrast for eval of hematuria: Heterogeneous appearance of the left renal pelvis and proximal left ureter with questionable soft tissue mass versus layering sludge within the dependent portion of the left renal pelvis. Normal sized spleen.  Lack of IV contrast precludes detailed evaluation of this abnormality.  U/A revealed ongoing signs of infection and he was treated with a dose of Rocephin and sent home with prescription for Keflex.  PA noted anemia with hemoglobin  of 9.8 at the time.  For about 6 months patient's had anorexia. Wife says he's just lost his appetite. He doesn't complain of nausea, abdominal pain, heartburn patient does say recently, maybe for a few weeks he's had increased difficulty swallowing but wife has not observed any coughing or gagging with PO. His weight has dropped from 140 # to current 113# over 6 months.  For 3 or 4 months he's had increased difficulty speaking. He's become progressively weaker. He shuffles when he walks. He has not fallen down.  On 8/27 endodontal history removed 4 front teeth. Afterwards he bled profusely.  Once the bleeding began, his stools became black and soft. He had no nausea, vomiting or GI distress. He had been taking fish oil and full dose aspirin before and after the procedure. He returned to the endodontist yesterday who removed the partial denture that had been placed on Monday, the bleeding sockets were treated with some sort of hemostatic topical therapy and the bleeding has pretty much ceased as of this morning.  He didn't eat or drink anything other than taking his meds yesterday. Because he was getting quite weak and his wife was certain he was dehydrated, she carried him to the ED this morning and had to help him with  significant effort to get him to the car  Hgb 7.9, was 11.5 on 03/09/17, 9.8 in 07/2016.   MCV is normal.  Platelets 113, were 87 4.5 months ago. FOBT +.    ?AKI vs progression of stage 2 CKD, BUN/Creat 43/1.6.  LFTs normal.  D dimer elevated, fibrinogen decreased, PT 16.2.  INR and PTT are normal.    Past Medical History:  Diagnosis Date  . Anemia   . CKD (chronic kidney disease), stage II   . 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 History of adenomatous polyp of colon    hyperplastic  2013  . History of kidney stones   . History of transient ischemic attack (TIA)    01-07-2012  . Hyperlipidemia   . Hypertension   . Hypothyroidism   . Impairment of balance   .  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 OA (osteoarthritis)   . Pancytopenia ([place])   . Parkinsonism ([place])    UNSPECIFIED type --  NEUROLOGIST-- DR Star Age  . Splenomegaly   . Ureteral tumor    left upper tract  . Wears dentures    full upper and partial lower  . Wears glasses   . Wears hearing aid    bilateral hears ---  very hoh over phone    Past Surgical History:  Procedure Laterality Date  . ANTERIOR CERVICAL DECOMP/DISCECTOMY FUSION  06/04/2008   C4 -- C7  . CATARACT EXTRACTION W/ INTRAOCULAR LENS  IMPLANT, BILATERAL  2016  . COLONOSCOPY  last one 07-26-2012  . CYSTOSCOPY WITH RETROGRADE PYELOGRAM, URETEROSCOPY AND STENT PLACEMENT Left 08/10/2016   Procedure: CYSTOSCOPY WITH LEFT  RETROGRADE PYELOGRAM, URETEROSCOPY URETERAL BIOPSY,  AND STENT PLACEMENT;  Surgeon: Ardis Hughs, MD;  Location: [facility];  Service: Urology;  Laterality: Left;  . CYSTOSCOPY/RETROGRADE/URETEROSCOPY/STONE EXTRACTION WITH BASKET  1987  . INGUINAL HERNIA REPAIR Right 1980's  . TRANSTHORACIC ECHOCARDIOGRAM  01/09/2012   mild focal basal LVH,  ef 55-60%,  grade 1 diastolic dysfunction/  mild AV stenosis , no regurg. , valve area 2.13cm/m^2 /  atrial septum lipomatous hypertrophy/  mild TR    Prior to Admission medications   Medication Sig Start Date End Date Taking? Authorizing Provider  amLODipine (NORVASC) 10 MG tablet Take 10 mg by mouth every morning.     [provider]  aspirin EC 325 MG EC tablet Take 1 tablet (325 mg total) by mouth daily. 01/09/12   Dhungel, Nishant, MD  atorvastatin (LIPITOR) 10 MG tablet Take 10 mg by mouth every morning.     [provider]  carbidopa-levodopa (SINEMET IR) 25-100 MG tablet Take 1 tablet by mouth 3 (three) times daily. Patient not taking: Reported on 07/12/2017 09/29/15   Star Age, MD  Cyanocobalamin (B-12) 1000 MCG SUBL Place 1 tablet under the tongue daily.     [provider]    docusate sodium (COLACE) 100 MG capsule Take 100 mg by mouth every morning.    [provider]  Ferrous Sulfate (IRON) 325 (65 Fe) MG TABS Take 1 tablet by mouth 2 (two) times daily.    [provider]  levothyroxine (SYNTHROID, LEVOTHROID) 112 MCG tablet Take 112 mcg by mouth daily before breakfast.     [provider]  losartan (COZAAR) 25 MG tablet Take 25 mg by mouth every morning.    [provider]  Multiple Vitamin (MULTIVITAMIN) tablet Take  1 tablet by mouth daily.    [provider]  Omega-3 Fatty Acids (FISH OIL) 1000 MG CAPS Take 3,000 mg by mouth 2 (two) times daily.    [provider]    Scheduled Meds:  Infusions: . pantoprazole (PROTONIX) IV 80 mg (08/02/17 1125)  . pantoprozole (PROTONIX) infusion     PRN Meds:    Allergies as of 08/02/2017 - Review Complete 08/02/2017  Allergen Reaction Noted  . Niacin and related Nausea And Vomiting and Other (See Comments) 02/19/2014  . Phenergan [promethazine hcl] Other (See Comments) 07/17/2012  . Valium [diazepam] Other (See Comments) 07/17/2012  . Azithromycin Rash 01/07/2012    Family History  Problem Relation Age of Onset  . Diabetes Sister     Social History   Social History  . Marital status: Single    Spouse name: N/A  . Number of children: 5  . Years of education: 12   Occupational History  . Retired     Social History Main Topics  . Smoking status: Never Smoker  . Smokeless tobacco: Never Used  . Alcohol use No  . Drug use: No  . Sexual activity: Not on file   Other Topics Concern  . Not on file   Social History Narrative   Denies caffeine use     REVIEW OF SYSTEMS: Constitutional:  weakness ENT:  No nose bleeds.  Oral bleeding post extractions as above Pulm:  No cough, no SOB CV:  No palpitations, no LE edema.  No chest pain.    GU:  No hematuria, no frequency GI:  Per HPI Heme:  Bruises easily.     Transfusions:  None.   Neuro:  No  headaches, no peripheral tingling or numbness Derm:  No itching, no rash or sores.  Endocrine:  No sweats or chills.  No polyuria or dysuria Immunization:  Did not inquire as to previous immunizations. Travel:  None beyond local counties in last few months.    PHYSICAL EXAM: Vital signs in last 24 hours: Vitals:   08/02/17 1030 08/02/17 1100  BP: (!) 133/105 121/63  Pulse: 80 74  Resp: 19 18  Temp:    SpO2: 100% 100%   Wt Readings from Last 3 Encounters:  07/12/17 53.5 kg (118 lb)  03/09/17 60.3 kg (132 lb 14.4 oz)  11/09/16 65 kg (143 lb 6.4 oz)    General: Severely cachectic appearing aged WM. Looks chronically ill.  Speech is very mumbled and difficult to understand, his wife helped in "interpreting" for the patient Head:  No asymmetry, no swelling, no signs of head trauma. Prominent facial bones and temporal wasting consistent with significant weight loss  Eyes:  Somewhat sunken. No scleral icterus. No conjunctival pallor. Ears:  Hard of hearing.  Nose:  No discharge or congestion. Mouth:  Lower lip swollen. Full upper denture plate was not removed for exam. Oral mucosa is dry. There is a layer of congealed blood overlying the sites of dental extraction in the front of the lower jaw. Just a few native teeth remain in the lower jaw. Neck:  No JVD, no masses, no thyromegaly. Lungs:  Clear bilaterally but patient unable to perform deep inspiration. No cough. No dyspnea. Heart: RRR. No MRG. S1, S2 present. Abdomen:  Thin, soft. No masses or HSM.Marland Kitchen   Rectal: DRE performed by ED staff's. Stool was black and FOBT positive. I did not repeat the exam   Musc/Skeltl: Arthritic changes in the fingers. Extremities:  No CCE. Pedal perfusion  brisk.  Neurologic:  Limb strength full with perhaps slightly diminished strength in the left leg.. Slight upper extremity tremor, more pronounced with movement.  Unable to tell me the year. Knows he is in the hospital. Alert. Blank, staring affect.  Not  able to follow all commands, not sure if he doesn't understand or he was not able to hear me due to his diminished hearing Skin:  No obvious sores, rashes or telangiectasia. Tattoos:  None Nodes:  No cervical adenopathy.   Psych:  Flat affect. Calm. Cooperative.  Intake/Output from previous day: No intake/output data recorded. Intake/Output this shift: No intake/output data recorded.  LAB RESULTS:  Recent Labs  08/02/17 0850  WBC 7.4  HGB 7.9*  HCT 23.1*  PLT 113*   BMET Lab Results  Component Value Date   NA 141 08/02/2017   NA 141 11/03/2016   NA 136 07/12/2016   K 3.8 08/02/2017   K 4.1 11/03/2016   K 4.2 07/12/2016   CL 108 08/02/2017   CL 105 07/12/2016   CL 104 02/19/2014   CO2 22 08/02/2017   CO2 22 11/03/2016   CO2 24 07/12/2016   GLUCOSE 133 (H) 08/02/2017   GLUCOSE 88 11/03/2016   GLUCOSE 111 (H) 07/12/2016   BUN 43 (H) 08/02/2017   BUN 18.8 11/03/2016   BUN 10 07/12/2016   CREATININE 1.60 (H) 08/02/2017   CREATININE 1.3 11/03/2016   CREATININE 1.33 (H) 07/12/2016   CALCIUM 9.2 08/02/2017   CALCIUM 9.1 11/03/2016   CALCIUM 9.6 07/12/2016   LFT  Recent Labs  08/02/17 0850  PROT 6.1*  ALBUMIN 3.9  AST 28  ALT 18  ALKPHOS 51  BILITOT 0.7   PT/INR No results found for: INR, PROTIME Hepatitis Panel No results for input(s): HEPBSAG, HCVAB, HEPAIGM, HEPBIGM in the last 72 hours. C-Diff No components found for: CDIFF Lipase  No results found for: LIPASE    RADIOLOGY STUDIES: No results found.    IMPRESSION:   *  Acute on chronic normocytic anemia.  Hemoglobin down to grams in the last 3 weeks, down almost 4 g in 4.5 months.    *  Melena coinciding with brisk post dental extractions bleeding.    *  Thrombocytopenia, noncritical.  Chronic.  However patient has signs of coagulopathy given the vigorous oral bleeding he has been having and abnormal d dimer and fibrinogen.    *  AKI.  Previous stage 2 CKD.  *  Dental extractions times  4 8/27 with associated vigorous bleeding requiring hemostatic topical therapy at the dentist 8/29.  *  Several months of anorexia with associated 27# weight loss. In the last couple of weeks he has developed dysphagia. No complaints of abdominal pain. No nausea or vomiting.  CT 3 weeks ago with abnormal left kidney/ureter  *  General decline.  *  Speech difficulty. Patient with history of parkinsonism but, according to his wife, did not respond to oral medication so M.D ultimately discontinued it, not sure what medicines he was taking.  *  Elevated d-dimer, diminished fibrinogen. Minor increase PT, normal INR and PTT.      PLAN:     *  Allow clear liquid diet.  *  There will not be much benefit in repeating CT abdomen without contrast. Dr. Hilarie Fredrickson prefers to pursue MRI abdomen which, with his GFR of 41, is > than the GFR 30 that is threshold for allowing MRI with contrast and thus preferable to CT given his AKI.  Obtain this to eval the left renal/ureteral findings of CT on 8/7  *  CT head as well as a chest x-ray are pending.  *  He does not need Protonix drip. I substituted this with twice a day oral Protonix.   Azucena Freed  08/02/2017, 11:28 AM Pager: [phone]

## 2017-08-02 NOTE — Progress Notes (Signed)
Pharmacy Antibiotic Note  Kyle Espinoza is a 71 y.o. male admitted on 08/02/2017 with intra-abdominal infxn.  Pharmacy has been consulted for unasyn dosing. Pt is afebrile and WBC is WNL. SCr is elevated above baseline at 1.6.   Plan: Unasyn 3gm IV Q8H F/u renal fxn, C&S, clinical status and LOT  Height: 5\' 11"  (180.3 cm) Weight: 117 lb 15.1 oz (53.5 kg) IBW/kg (Calculated) : 75.3  Temp (24hrs), Avg:97.5 F (36.4 C), Min:97.5 F (36.4 C), Max:97.5 F (36.4 C)   Recent Labs Lab 08/02/17 0850  WBC 7.4  CREATININE 1.60*    Estimated Creatinine Clearance: 31.6 mL/min (A) (by C-G formula based on SCr of 1.6 mg/dL (H)).    Allergies  Allergen Reactions  . Niacin And Related Nausea And Vomiting and Other (See Comments)    And dizzy  . Phenergan [Promethazine Hcl] Other (See Comments)    hallucinations  . Valium [Diazepam] Other (See Comments)    Severe confusion  . Azithromycin Rash    Antimicrobials this admission: Unasyn 8/30>>  Dose adjustments this admission: N/A  Microbiology results: Pending  Thank you for allowing pharmacy to be a part of this patient's care.  Kyle Espinoza, Rande Lawman 08/02/2017 12:35 PM

## 2017-08-02 NOTE — ED Triage Notes (Signed)
Pt here with wife who reports he had dental work on Monday. Pt has had trouble with bleeding. Wife states he has had poor PO intake for several days and has seemed disoriented. Vitals stable. Pt appears pale in color.

## 2017-08-02 NOTE — ED Notes (Signed)
PT drinking contrast at this time

## 2017-08-02 NOTE — Progress Notes (Signed)
Received report from nurse, Vikki Ports.

## 2017-08-02 NOTE — ED Notes (Signed)
Dr. Hobbs at bedside  

## 2017-08-02 NOTE — ED Provider Notes (Signed)
[facility] DEPT Provider Note   CSN: [id number] Arrival date & time: 08/02/17  0802     History   Chief Complaint Chief Complaint  Patient presents with  . Altered Mental Status    HPI Kyle Espinoza is a 71 y.o. male.  HPI    67yM with numerous complaints as relayed by his wife and review of records. Extensive medical history including CKD stage 2, left eye blindness, history of TIA in 2013, hyperlipidemia, hypertension, hypothyroidism, impairment of balance, osteoarthritis, history of parkinsonism followed by neurology, history of splenomegaly, Ureteral tumor on the left, and a history of pancytopenia followed by Dr. Alen Blew, last seen in April of this year, at which time he was found to have stable counts. He was brought to the ED, after his wife found him to have increased confusion, generalized weakness, and 1 episode of what melanotic stools. She describes it as one episode of severe diarrhea without further events. This follows the recent teeth extraction. Wife reports that at the time no significant bleeding was seen.  No apparent fever, chills, chest pain or palpitations.  She denies the patient having any increase shortness of breath, or cough. No apparent sick contacts. No increased lower extremity swelling. HAs significant FTT with a 20 lb weight loss over the last 2 months     Past Medical History:  Diagnosis Date  . Anemia   . CKD (chronic kidney disease), stage II   . 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 History of adenomatous polyp of colon    hyperplastic  2013  . History of kidney stones   . History of transient ischemic attack (TIA)    01-07-2012  . Hyperlipidemia   . Hypertension   . Hypothyroidism   . Impairment of balance   . 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 OA (osteoarthritis)   . Pancytopenia ([place])   . Parkinsonism ([place])    UNSPECIFIED type --  NEUROLOGIST-- DR Star Age  . Splenomegaly   . Ureteral  tumor    left upper tract  . Wears dentures    full upper and partial lower  . Wears glasses   . Wears hearing aid    bilateral hears ---  very hoh over phone    Patient Active Problem List   Diagnosis Date Noted  . TIA (transient ischemic attack) 01/09/2012  . Slurred speech 01/08/2012  . HTN (hypertension) 01/08/2012    Past Surgical History:  Procedure Laterality Date  . ANTERIOR CERVICAL DECOMP/DISCECTOMY FUSION  06/04/2008   C4 -- C7  . CATARACT EXTRACTION W/ INTRAOCULAR LENS  IMPLANT, BILATERAL  2016  . COLONOSCOPY  last one 07-26-2012  . CYSTOSCOPY WITH RETROGRADE PYELOGRAM, URETEROSCOPY AND STENT PLACEMENT Left 08/10/2016   Procedure: CYSTOSCOPY WITH LEFT  RETROGRADE PYELOGRAM, URETEROSCOPY URETERAL BIOPSY,  AND STENT PLACEMENT;  Surgeon: Ardis Hughs, MD;  Location: [facility];  Service: Urology;  Laterality: Left;  . CYSTOSCOPY/RETROGRADE/URETEROSCOPY/STONE EXTRACTION WITH BASKET  1987  . INGUINAL HERNIA REPAIR Right 1980's  . TRANSTHORACIC ECHOCARDIOGRAM  01/09/2012   mild focal basal LVH,  ef 55-60%,  grade 1 diastolic dysfunction/  mild AV stenosis , no regurg. , valve area 2.13cm/m^2 /  atrial septum lipomatous hypertrophy/  mild TR       Home Medications    Prior to Admission medications   Medication Sig Start Date End Date Taking? Authorizing Provider  amLODipine (  NORVASC) 10 MG tablet Take 10 mg by mouth every morning.     [provider]  aspirin EC 325 MG EC tablet Take 1 tablet (325 mg total) by mouth daily. 01/09/12   Dhungel, Nishant, MD  atorvastatin (LIPITOR) 10 MG tablet Take 10 mg by mouth every morning.     [provider]  carbidopa-levodopa (SINEMET IR) 25-100 MG tablet Take 1 tablet by mouth 3 (three) times daily. Patient not taking: Reported on 07/12/2017 09/29/15   Star Age, MD  Cyanocobalamin (B-12) 1000 MCG SUBL Place 1 tablet under the tongue daily.     [provider]  docusate sodium  (COLACE) 100 MG capsule Take 100 mg by mouth every morning.    [provider]  Ferrous Sulfate (IRON) 325 (65 Fe) MG TABS Take 1 tablet by mouth 2 (two) times daily.    [provider]  levothyroxine (SYNTHROID, LEVOTHROID) 112 MCG tablet Take 112 mcg by mouth daily before breakfast.     [provider]  losartan (COZAAR) 25 MG tablet Take 25 mg by mouth every morning.    [provider]  Multiple Vitamin (MULTIVITAMIN) tablet Take 1 tablet by mouth daily.    [provider]  Omega-3 Fatty Acids (FISH OIL) 1000 MG CAPS Take 3,000 mg by mouth 2 (two) times daily.    [provider]    Family History Family History  Problem Relation Age of Onset  . Diabetes Sister     Social History Social History  Substance Use Topics  . Smoking status: Never Smoker  . Smokeless tobacco: Never Used  . Alcohol use No     Allergies   Niacin and related; Phenergan [promethazine hcl]; Valium [diazepam]; and Azithromycin   Review of Systems Review of Systems  All systems reviewed and negative, other than as noted in HPI.  Physical Exam Updated Vital Signs BP 124/70   Pulse 81   Temp (!) 97.5 F (36.4 C) (Oral)   Resp 17   SpO2 99%   Physical Exam  Constitutional: He appears well-developed and well-nourished. No distress.  Laying in bed. Appears pale but in NAD.   HENT:  Head: Normocephalic and atraumatic.  Small amount of blood noted in oropharynx. No active discrete source identified. Handling secretions.   Eyes: Conjunctivae are normal. Right eye exhibits no discharge. Left eye exhibits no discharge.  Neck: Neck supple.  Cardiovascular: Normal rate, regular rhythm and normal heart sounds.  Exam reveals no gallop and no friction rub.   No murmur heard. Pulmonary/Chest: Effort normal and breath sounds normal. No respiratory distress.  Abdominal: Soft. He exhibits no distension. There is no tenderness.  Genitourinary:  Genitourinary  Comments: Melanotic stool.   Musculoskeletal: He exhibits no edema or tenderness.  Neurological: He is alert.  Skin: Skin is warm and dry.  Psychiatric: He has a normal mood and affect. His behavior is normal. Thought content normal.  Nursing note and vitals reviewed.    ED Treatments / Results  Labs (all labs ordered are listed, but only abnormal results are displayed) Labs Reviewed  COMPREHENSIVE METABOLIC PANEL - Abnormal; Notable for the following:       Result Value   Glucose, Bld 133 (*)    BUN 43 (*)    Creatinine, Ser 1.60 (*)    Total Protein 6.1 (*)    GFR calc non Af Amer 41 (*)    GFR calc Af Amer 48 (*)    All other components within  normal limits  CBC - Abnormal; Notable for the following:    RBC 2.71 (*)    Hemoglobin 7.9 (*)    HCT 23.1 (*)    Platelets 113 (*)    All other components within normal limits  URINALYSIS, ROUTINE W REFLEX MICROSCOPIC  CBG MONITORING, ED  POC OCCULT BLOOD, ED  TYPE AND SCREEN    EKG  EKG Interpretation None       Radiology No results found.  Procedures Procedures (including critical care time)  Medications Ordered in ED Medications  sodium chloride 0.9 % bolus 1,000 mL (not administered)  pantoprazole (PROTONIX) 80 mg in sodium chloride 0.9 % 100 mL IVPB (not administered)     Initial Impression / Assessment and Plan / ED Course  I have reviewed the triage vital signs and the nursing notes.  Pertinent labs & imaging results that were available during my care of the patient were reviewed by me and considered in my medical decision making (see chart for details).     70yM with increasing weakness, weight loss and more acutely bleeding from gums s/p tooth extraction and some confusion last night.   Increasingly anemic. Stools looks melanotic on exam. Recent oral bleeding may be complicating but I doubt it explains everything. Some concern for possible PUD. Recent significant weight loss w/o clear explanation. He  denies pain but anorexia can be symptom of PUD. BUN 42 with Cr of 1.6. Some probably pre-renal but may give support to UGIB as well. Protonix.  Type and screen. Hemoglobin 7.9. Baseline appears to be around ~10.  Transfusion deferred currently. Not tachycardic or hypotensive. Denies dyspnea, lightheadedness, fatigue, etc but doesn't seem like the most reliable historian currently.   Not sure of what to make of confusion. Wife says now back to baseline more or less. Congenital eye problem and wife reports L eye ptosis is chronic. Hard of hearing. Neuro exam otherwise seems nonfocal. I doubt CVA. UA pending.    Final Clinical Impressions(s) / ED Diagnoses   Final diagnoses:  Acute encephalopathy  Symptomatic anemia  GIB (gastrointestinal bleeding)    New Prescriptions New Prescriptions   No medications on file     Virgel Manifold, MD 08/24/17 1013

## 2017-08-02 NOTE — Progress Notes (Signed)
Pt arrived to unit with wife at bedside. Pt stable, with no noted distress. He denies pain or discomfort. Telemetry monitoring. Pt oriented to room. Safety measures in place. Call bell within reach. Will continue to monitor.

## 2017-08-02 NOTE — Progress Notes (Signed)
Sent page to MD. Patient to have transfusion, and I have no order, no type and cross. Awaiting call back.

## 2017-08-02 NOTE — Progress Notes (Signed)
Lab called reporting Hgb 6.5. Md text paged.

## 2017-08-02 NOTE — ED Notes (Signed)
Pt back from CT

## 2017-08-02 NOTE — H&P (Signed)
History and Physical    Kyle Espinoza WLN:[id number] DOB: [birth year] DOA: 08/02/2017   PCP: Deland Pretty, MD   Patient coming from:  Home    Chief Complaint:Confusion and dark stools  Level V Caveat, history obtained by wife   HPI: Kyle Espinoza is a 71 y.o. 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 Alen Blew, last seen in April of this year, at which time he was found to have stable counts. He was brought to the ED, after his wife found him to have increased confusion, generalized weakness, and 1 episode of what melanotic stools. She describes it as one episode of severe diarrhea without further events. This follows the recent teeth extraction. Wife reports that at the time no significant bleeding was seen.  No apparent fever, chills, chest pain or palpitations.  She denies the patient having any increase shortness of breath, or cough. No apparent sick contacts. No increased lower extremity swelling. HAs significant FTT with a 20 lb weight loss over the last 2 months .Of note, the patient has known intentional tremors, but she reports that he has a new tremor on his lips, for the last week. He is incontinent of urine, but she denies the patient having any hematuria. No apparent headaches or seizures. No history of substance abuse. No recent long distance trips. No tics or other insect bites.He had a recent procedure on 7/24 for removal of lipoma, continues to show bruising in the area    ED Course:  BP 123/62   Pulse 79   Temp (!) 97.5 F (36.4 C)   Resp 16   Ht 5\' 11"  (1.803 m)   Wt 53.5 kg (117 lb 15.1 oz)   SpO2 100%   BMI 16.45 kg/m   Sodium 141 potassium 3.8 bicarb 22 glucose 133 BUN 43 creatinine 1.6  calcium 9.2 alkaline phosphatase 51 albumin 3.9 AST 28 ALT 18 total protein 6.1 bilirubin 0.7 white count 7.4 hemoglobin 7.9 was 11.5 in April of this year  MCV 85.2  platelets 113, were 87 earlier this year in April Hemoccult dispositive today. Of note, the patient has had a colonoscopy in 2013, which time no polyps or other abnormalities were noted. Receiving IV fluids and he was placed on Protonix drip.  Review of Systems:  As per HPI otherwise all other systems reviewed and are negative  Past Medical History:  Diagnosis Date  . Anemia   . CKD (chronic kidney disease), stage II   . 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 History of adenomatous polyp of colon    hyperplastic  2013  . History of kidney stones   . History of transient ischemic attack (TIA)    01-07-2012  . Hyperlipidemia   . Hypertension   . Hypothyroidism   . Impairment of balance   . 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 OA (osteoarthritis)   . Pancytopenia ([facility])   . Parkinsonism ([place])    UNSPECIFIED type --  NEUROLOGIST-- DR Star Age  . Splenomegaly   . Ureteral tumor    left upper tract  . Wears dentures    full upper and partial lower  . Wears glasses   .  Wears hearing aid    bilateral hears ---  very hoh over phone    Past Surgical History:  Procedure Laterality Date  . ANTERIOR CERVICAL DECOMP/DISCECTOMY FUSION  06/04/2008   C4 -- C7  . CATARACT EXTRACTION W/ INTRAOCULAR LENS  IMPLANT, BILATERAL  2016  . COLONOSCOPY  last one 07-26-2012  . CYSTOSCOPY WITH RETROGRADE PYELOGRAM, URETEROSCOPY AND STENT PLACEMENT Left 08/10/2016   Procedure: CYSTOSCOPY WITH LEFT  RETROGRADE PYELOGRAM, URETEROSCOPY URETERAL BIOPSY,  AND STENT PLACEMENT;  Surgeon: Ardis Hughs, MD;  Location: [facility];  Service: Urology;  Laterality: Left;  . CYSTOSCOPY/RETROGRADE/URETEROSCOPY/STONE EXTRACTION WITH BASKET  1987  . INGUINAL HERNIA REPAIR Right 1980's  .  TRANSTHORACIC ECHOCARDIOGRAM  01/09/2012   mild focal basal LVH,  ef 55-60%,  grade 1 diastolic dysfunction/  mild AV stenosis , no regurg. , valve area 2.13cm/m^2 /  atrial septum lipomatous hypertrophy/  mild TR    Social History Social History   Social History  . Marital status: Single    Spouse name: N/A  . Number of children: 5  . Years of education: 12   Occupational History  . Retired     Social History Main Topics  . Smoking status: Never Smoker  . Smokeless tobacco: Never Used  . Alcohol use No  . Drug use: No  . Sexual activity: Not on file   Other Topics Concern  . Not on file   Social History Narrative   Denies caffeine use      Allergies  Allergen Reactions  . Niacin And Related Nausea And Vomiting and Other (See Comments)    And dizzy  . Phenergan [Promethazine Hcl] Other (See Comments)    hallucinations  . Valium [Diazepam] Other (See Comments)    Severe confusion  . Azithromycin Rash    Family History  Problem Relation Age of Onset  . Diabetes Sister       Prior to Admission medications   Medication Sig Start Date End Date Taking? Authorizing Provider  amLODipine (NORVASC) 10 MG tablet Take 10 mg by mouth every morning.     [provider]  aspirin EC 325 MG EC tablet Take 1 tablet (325 mg total) by mouth daily. 01/09/12   Dhungel, Nishant, MD  atorvastatin (LIPITOR) 10 MG tablet Take 10 mg by mouth every morning.     [provider]  carbidopa-levodopa (SINEMET IR) 25-100 MG tablet Take 1 tablet by mouth 3 (three) times daily. Patient not taking: Reported on 07/12/2017 09/29/15   Star Age, MD  Cyanocobalamin (B-12) 1000 MCG SUBL Place 1 tablet under the tongue daily.     [provider]  docusate sodium (COLACE) 100 MG capsule Take 100 mg by mouth every morning.    [provider]  Ferrous Sulfate (IRON) 325 (65 Fe) MG TABS Take 1 tablet by mouth 2 (two) times daily.    [provider]    levothyroxine (SYNTHROID, LEVOTHROID) 112 MCG tablet Take 112 mcg by mouth daily before breakfast.     [provider]  losartan (COZAAR) 25 MG tablet Take 25 mg by mouth every morning.    [provider]  Multiple Vitamin (MULTIVITAMIN) tablet Take 1 tablet by mouth daily.    [provider]  Omega-3 Fatty Acids (FISH OIL) 1000 MG CAPS Take 3,000 mg by mouth 2 (two) times daily.    [provider]    Physical Exam:  Vitals:   08/02/17 1130 08/02/17 1200 08/02/17 1230 08/02/17  1240  BP: 134/66  123/62   Pulse: 80  79   Resp: 16  16   Temp:    (!) 97.5 F (36.4 C)  TempSrc:      SpO2: 100%  100%   Weight:  53.5 kg (117 lb 15.1 oz)    Height:  5\' 11"  (1.803 m)     Constitutional: NAD, confused, unintelligible words. Temporal wasting noted. Frail. Chronically ill appearing Eyes: PERRL, lids and conjunctivae normal on the right, L eye blind with scleral opacity ENMT: Mucous membranes are moist, lower gums are bloody, inflamed at the excision site, but no purulent material or odor noted Neck: normal, supple, no masses, no thyromegaly Respiratory: clear to auscultation bilaterally, no wheezing, no crackles anteriorly  Normal respiratory effort  Cardiovascular: Regular rate and rhythm, soft 1/6  murmur, rubs or gallops. No extremity edema. 2+ pedal pulses. No carotid bruits.  Abdomen: Soft, no apparent tenderness, No hepatotomegaly, possible palpable tip of spleen.Bowel sounds positive.  Musculoskeletal: no clubbing / cyanosis. Moves all extremities Skin: no jaundice, has multiple areas of bruising including an area in back at recent excision of a lipoma Neurologic: Unable to follow commands, confused, unable to test strength or sensation. Intention tremor and lip tremor noted, constant.     Labs on Admission: I have personally reviewed following labs and imaging studies  CBC:  Recent Labs Lab 08/02/17 0850 08/02/17 1154  WBC 7.4  --   HGB 7.9*   --   HCT 23.1*  --   MCV 85.2  --   PLT 113* 73*    Basic Metabolic Panel:  Recent Labs Lab 08/02/17 0850  NA 141  K 3.8  CL 108  CO2 22  GLUCOSE 133*  BUN 43*  CREATININE 1.60*  CALCIUM 9.2    GFR: Estimated Creatinine Clearance: 31.6 mL/min (A) (by C-G formula based on SCr of 1.6 mg/dL (H)).  Liver Function Tests:  Recent Labs Lab 08/02/17 0850  AST 28  ALT 18  ALKPHOS 51  BILITOT 0.7  PROT 6.1*  ALBUMIN 3.9   No results for input(s): LIPASE, AMYLASE in the last 168 hours.  Recent Labs Lab 08/02/17 1154  AMMONIA 10    Coagulation Profile:  Recent Labs Lab 08/02/17 1154  INR 1.32  1.27    Cardiac Enzymes:  Recent Labs Lab 08/02/17 1154  TROPONINI <0.03    BNP (last 3 results) No results for input(s): PROBNP in the last 8760 hours.  HbA1C: No results for input(s): HGBA1C in the last 72 hours.  CBG: No results for input(s): GLUCAP in the last 168 hours.  Lipid Profile: No results for input(s): CHOL, HDL, LDLCALC, TRIG, CHOLHDL, LDLDIRECT in the last 72 hours.  Thyroid Function Tests:  Recent Labs  08/02/17 1154  TSH 1.351    Anemia Panel:  Recent Labs  08/02/17 1154  RETICCTPCT 1.2    Urine analysis:    Component Value Date/Time   COLORURINE YELLOW 08/02/2017 1033   APPEARANCEUR CLEAR 08/02/2017 1033   LABSPEC 1.015 08/02/2017 1033   PHURINE 6.0 08/02/2017 1033   GLUCOSEU NEGATIVE 08/02/2017 1033   [place] 08/02/2017 1033   [place] 08/02/2017 1033   [place] 08/02/2017 1033   PROTEINUR NEGATIVE 08/02/2017 1033   UROBILINOGEN 0.2 01/07/2012 1235   NITRITE NEGATIVE 08/02/2017 1033   LEUKOCYTESUR NEGATIVE 08/02/2017 1033    Sepsis Labs: @LABRCNTIP (procalcitonin:4,lacticidven:4) )No results found for this or any previous visit (from the past 240 hour(s)).   Radiological Exams  on Admission: Dg Chest Port 1 View  Result Date: 08/02/2017 CLINICAL DATA:  Weakness.  Acute  encephalopathy. EXAM: PORTABLE CHEST 1 VIEW COMPARISON:  01/08/2012 FINDINGS: Cardiomediastinal silhouette is normal. Mediastinal contours appear intact. Tortuosity of the aorta noted. There is no evidence of focal airspace consolidation, pleural effusion or pneumothorax. Osseous structures are without acute abnormality. Soft tissues are grossly normal. IMPRESSION: No active disease. Electronically Signed   By: Fidela Salisbury M.D.   On: 08/02/2017 11:38    EKG: Independently reviewed.  Assessment/Plan Principal Problem:   GIB (gastrointestinal bleeding) Active Problems:   Hypertension   TIA (transient ischemic attack)   Pancytopenia ([facility])   Splenomegaly   Hyperlipemia   Legally blind   CKD (chronic kidney disease)   Debility   Parkinsonism ([facility])   Symptomatic anemia   Gastro Intestinal Bleed with symptomatic anemia  Likely higer, with visible dark stools, Hcult positive at the ED  IV Protonix drip was started by EDP   current Hgb is 7.9 in a patient with a history of pancytopenia. Last Hb in 03/2017 was 11. BUN is 43 Will type and screen, check serial CBCs, and transfuse for Hgb less than 7    IV protonix. IVF  NPO until GI eval then advance diet   Gastroenterology has been consulted. Doubt patient a candidate for procedure given his active bleeding issues and overall debility  Hold NSAIDs/ ASA Anemia panel  CT A/P with oral contrast r/o colitis or bleeding sources   Thrombocytopenia, chronic, in the setting of known splenomegaly, and current bleeding issues, with gingival bleeding ad significant bruising No transfusion is indicated at this time Monitor counts closely Transfuse 1 unit of platelets if count is less or equal than 10,000,  or 20,000 if the patient is acutely bleeding Check DIC panel  PT. INR, PTT Check B12   Save smear  Anemia of chronic disease, pancytopenia and  GIB Hemoglobin on admission 7.9 Hcult +  T and S  Repeat CBC in am  No transfusion is indicated  at this time unless continues to bleed and Hb drops to less than 7 Continue Iron supplements GI to see  Save smear Anemia panel    Acute encephalopathy. Etiology unclear. UA neg. WBC normal  Afebrile. Prior h/o TIA  CT head r/o masses or bleeding  Vitamin B-12 ammonia level and TSH Unasyn for infection coverage   Chronic kidney disease stage 2  baseline creatinine 1.3- 1.5  Current Cr 1.6  Lab Results  Component Value Date   CREATININE 1.60 (H) 08/02/2017   CREATININE 1.3 11/03/2016   CREATININE 1.33 (H) 07/12/2016  IVF Repeat CMET in am Avoid NSAIDs/ ASA   Hypertension BP 123/62   Pulse 79   Temp (!) 97.5 F (36.4 C)   Resp 16   Ht 5\' 11"  (1.803 m)   Wt 53.5 kg (117 lb 15.1 oz)   SpO2 100%   BMI 16.45 kg/m  Continue home anti-hypertensive medications  Add Hydralazine Q6 hours as needed for BP 160/90    Hyperlipidemia Continue home statins  Hypothyroidism: Continue home Synthroid CHeck TSH   Dementia, not on meds at this time     DVT prophylaxis:  SCD Code Status:    DNR Family Communication:  Discussed with patient's wife Disposition Plan: Expect patient to be discharged to home after condition improves Consults called:    GI by EDP  Admission status: IP tele   Averie Hornbaker E, PA-C [facility]   08/02/2017, 1:11 PM

## 2017-08-03 ENCOUNTER — Encounter (HOSPITAL_COMMUNITY): Payer: Self-pay | Admitting: Internal Medicine

## 2017-08-03 ENCOUNTER — Inpatient Hospital Stay (HOSPITAL_COMMUNITY): Payer: Medicare HMO | Admitting: Anesthesiology

## 2017-08-03 ENCOUNTER — Encounter (HOSPITAL_COMMUNITY)
Admission: EM | Disposition: A | Discharge status: Hospice | Payer: Self-pay | Source: Home / Self Care | Attending: Internal Medicine

## 2017-08-03 DIAGNOSIS — R5381 Other malaise: Secondary | ICD-10-CM

## 2017-08-03 DIAGNOSIS — G20 Parkinson's disease: Secondary | ICD-10-CM

## 2017-08-03 DIAGNOSIS — E785 Hyperlipidemia, unspecified: Secondary | ICD-10-CM

## 2017-08-03 DIAGNOSIS — K921 Melena: Secondary | ICD-10-CM

## 2017-08-03 DIAGNOSIS — G9340 Encephalopathy, unspecified: Secondary | ICD-10-CM

## 2017-08-03 DIAGNOSIS — D62 Acute posthemorrhagic anemia: Secondary | ICD-10-CM

## 2017-08-03 DIAGNOSIS — D61818 Other pancytopenia: Secondary | ICD-10-CM

## 2017-08-03 DIAGNOSIS — D649 Anemia, unspecified: Secondary | ICD-10-CM

## 2017-08-03 DIAGNOSIS — R161 Splenomegaly, not elsewhere classified: Secondary | ICD-10-CM

## 2017-08-03 DIAGNOSIS — D65 Disseminated intravascular coagulation [defibrination syndrome]: Secondary | ICD-10-CM

## 2017-08-03 HISTORY — PX: ESOPHAGOGASTRODUODENOSCOPY: SHX5428

## 2017-08-03 LAB — CBC WITH DIFFERENTIAL/PLATELET
BASOS ABS: 0 10*3/uL (ref 0.0–0.1)
BASOS PCT: 0 %
BASOS PCT: 0 %
Basophils Absolute: 0 10*3/uL (ref 0.0–0.1)
EOS PCT: 0 %
Eosinophils Absolute: 0 10*3/uL (ref 0.0–0.7)
Eosinophils Absolute: 0 10*3/uL (ref 0.0–0.7)
Eosinophils Relative: 1 %
HCT: 26.2 % — ABNORMAL LOW (ref 39.0–52.0)
HEMATOCRIT: 26.9 % — AB (ref 39.0–52.0)
HEMOGLOBIN: 8.9 g/dL — AB (ref 13.0–17.0)
Hemoglobin: 8.5 g/dL — ABNORMAL LOW (ref 13.0–17.0)
LYMPHS ABS: 0.6 10*3/uL — AB (ref 0.7–4.0)
LYMPHS PCT: 19 %
Lymphocytes Relative: 26 %
Lymphs Abs: 0.8 10*3/uL (ref 0.7–4.0)
MCH: 28.2 pg (ref 26.0–34.0)
MCH: 28.4 pg (ref 26.0–34.0)
MCHC: 32.4 g/dL (ref 30.0–36.0)
MCHC: 33.1 g/dL (ref 30.0–36.0)
MCV: 87 fL (ref 78.0–100.0)
MCV: 85.9 fL (ref 78.0–100.0)
MONO ABS: 0.3 10*3/uL (ref 0.1–1.0)
MONOS PCT: 9 %
MONOS PCT: 7 %
Monocytes Absolute: 0.2 10*3/uL (ref 0.1–1.0)
NEUTROS ABS: 2.8 10*3/uL (ref 1.7–7.7)
NEUTROS PCT: 73 %
Neutro Abs: 1.6 10*3/uL — ABNORMAL LOW (ref 1.7–7.7)
Neutrophils Relative %: 64 %
PLATELETS: 60 10*3/uL — AB (ref 150–400)
Platelets: 69 10*3/uL — ABNORMAL LOW (ref 150–400)
RBC: 3.01 MIL/uL — AB (ref 4.22–5.81)
RBC: 3.13 MIL/uL — ABNORMAL LOW (ref 4.22–5.81)
RDW: 14.7 % (ref 11.5–15.5)
RDW: 14.7 % (ref 11.5–15.5)
WBC: 2.4 10*3/uL — AB (ref 4.0–10.5)
WBC: 3.9 10*3/uL — ABNORMAL LOW (ref 4.0–10.5)

## 2017-08-03 LAB — COMPREHENSIVE METABOLIC PANEL
ALBUMIN: 3.2 g/dL — AB (ref 3.5–5.0)
ALK PHOS: 38 U/L (ref 38–126)
ALT: 16 U/L — ABNORMAL LOW (ref 17–63)
ALT: 17 U/L (ref 17–63)
ANION GAP: 11 (ref 5–15)
ANION GAP: 8 (ref 5–15)
AST: 25 U/L (ref 15–41)
AST: 29 U/L (ref 15–41)
Albumin: 3 g/dL — ABNORMAL LOW (ref 3.5–5.0)
Alkaline Phosphatase: 41 U/L (ref 38–126)
BUN: 21 mg/dL — ABNORMAL HIGH (ref 6–20)
BUN: 29 mg/dL — ABNORMAL HIGH (ref 6–20)
CALCIUM: 8.2 mg/dL — AB (ref 8.9–10.3)
CHLORIDE: 111 mmol/L (ref 101–111)
CO2: 21 mmol/L — AB (ref 22–32)
CO2: 21 mmol/L — AB (ref 22–32)
Calcium: 8.4 mg/dL — ABNORMAL LOW (ref 8.9–10.3)
Chloride: 111 mmol/L (ref 101–111)
Creatinine, Ser: 1.11 mg/dL (ref 0.61–1.24)
Creatinine, Ser: 1.24 mg/dL (ref 0.61–1.24)
GFR calc non Af Amer: 56 mL/min — ABNORMAL LOW (ref >60)
GFR calc non Af Amer: >60 mL/min (ref >60)
GLUCOSE: 90 mg/dL (ref 65–99)
Glucose, Bld: 99 mg/dL (ref 65–99)
POTASSIUM: 3.5 mmol/L (ref 3.5–5.1)
Potassium: 3.7 mmol/L (ref 3.5–5.1)
SODIUM: 143 mmol/L (ref 135–145)
SODIUM: 140 mmol/L (ref 135–145)
TOTAL PROTEIN: 4.8 g/dL — AB (ref 6.5–8.1)
Total Bilirubin: 0.6 mg/dL (ref 0.3–1.2)
Total Bilirubin: 0.7 mg/dL (ref 0.3–1.2)
Total Protein: 5.3 g/dL — ABNORMAL LOW (ref 6.5–8.1)

## 2017-08-03 LAB — CBC
HEMATOCRIT: 18.8 % — AB (ref 39.0–52.0)
HEMOGLOBIN: 6.2 g/dL — AB (ref 13.0–17.0)
MCH: 28.4 pg (ref 26.0–34.0)
MCHC: 33 g/dL (ref 30.0–36.0)
MCV: 86.2 fL (ref 78.0–100.0)
Platelets: 54 10*3/uL — ABNORMAL LOW (ref 150–400)
RBC: 2.18 MIL/uL — AB (ref 4.22–5.81)
RDW: 15.3 % (ref 11.5–15.5)
WBC: 2.3 10*3/uL — ABNORMAL LOW (ref 4.0–10.5)

## 2017-08-03 LAB — PROTIME-INR
INR: 1.39
PROTHROMBIN TIME: 17 s — AB (ref 11.4–15.2)

## 2017-08-03 LAB — MAGNESIUM: Magnesium: 2.1 mg/dL (ref 1.7–2.4)

## 2017-08-03 LAB — PHOSPHORUS: Phosphorus: 3.1 mg/dL (ref 2.5–4.6)

## 2017-08-03 SURGERY — EGD (ESOPHAGOGASTRODUODENOSCOPY)
Anesthesia: Monitor Anesthesia Care

## 2017-08-03 MED ORDER — BOOST / RESOURCE BREEZE PO LIQD
1.0000 | Freq: Three times a day (TID) | ORAL | Status: DC
  Administered 2017-08-06 – 2017-08-07 (×2): 1 via ORAL
  Filled 2017-08-03 (×15): qty 1

## 2017-08-03 MED ORDER — PANTOPRAZOLE SODIUM 40 MG PO TBEC
40.0000 mg | DELAYED_RELEASE_TABLET | Freq: Every day | ORAL | Status: DC
  Administered 2017-08-04: 40 mg via ORAL
  Filled 2017-08-03 (×2): qty 1

## 2017-08-03 MED ORDER — PROPOFOL 500 MG/50ML IV EMUL
INTRAVENOUS | Status: DC | PRN
  Administered 2017-08-03: 75 ug/kg/min via INTRAVENOUS

## 2017-08-03 MED ORDER — PHENYLEPHRINE 40 MCG/ML (10ML) SYRINGE FOR IV PUSH (FOR BLOOD PRESSURE SUPPORT)
PREFILLED_SYRINGE | INTRAVENOUS | Status: DC | PRN
  Administered 2017-08-03: 80 ug via INTRAVENOUS

## 2017-08-03 MED ORDER — SODIUM CHLORIDE 0.9 % IV SOLN: INTRAVENOUS | Status: DC

## 2017-08-03 NOTE — Progress Notes (Signed)
OT Cancellation Note  Patient Details Name: Kyle Espinoza MRN: [id number] DOB: [birth year]   Cancelled Treatment:     Reason Evaluation not completed/Pt not seen for OT: Cancel/medical. Orders received and chart reviewed, followed by discussion with RN. Pt just finished 2 units of blood this morning and is scheduled to go for further testing. Will cancel OT Assessment and check back as able and pt medically able to participate.  Josephine Igo Dixon 08/03/2017, 9:50 AM

## 2017-08-03 NOTE — Progress Notes (Signed)
Update Note Late Entry Spoke to pt's wife over phone about need for transfusion She gave verbal consent. We discussed risks and benefits and I answered all questions. Updated nursing. Called blood bank. Called pharmacy to get pt back on Protonix drip.  Kyle Espinoza

## 2017-08-03 NOTE — Progress Notes (Signed)
PROGRESS NOTE    Kyle Espinoza  QIO:[id number] DOB: [birth year] DOA: 08/02/2017 PCP: Deland Pretty, MD   Brief Narrative:  Kyle Espinoza is a 71 y.o. 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 Alen Blew, last seen in April of this year, at which time he was found to have stable counts. He was brought to the ED, after his wife found him to have increased confusion, generalized weakness, and 1 episode of what melanotic stools. She describes it as one episode of severe diarrhea without further events. This follows the recent teeth extraction. Wife reports that at the time no significant bleeding was seen.  No apparent fever, chills, chest pain or palpitations.  She denied the patient having any increase shortness of breath, or cough. No apparent sick contacts. No increased lower extremity swelling.   Has significant FTT with a 20 lb weight loss over the last 2 months. Of note, the patient has known intentional tremors, but she reports that he has a new tremor on his lips, for the last week. He is incontinent of urine, but she denies the patient having any hematuria. He was admitted for confusion and dark melanotic stools and is currently being worked up. Gastroenterology consulted and patient underwent EGD and MRI of the Abdomen is currently pending.   Labs Indicate that patient has DIC so Hematology Dr. Burr Medico was consulted for further evalation and recommendation. For now she advised q8H CBC's and Supportive Care.   Assessment & Plan:   Principal Problem:   GIB (gastrointestinal bleeding) Active Problems:   Hypertension   TIA (transient ischemic attack)   Pancytopenia (HCC)   Splenomegaly   Hyperlipemia   Legally  blind   CKD (chronic kidney disease)   Debility   Parkinsonism (HCC)   Symptomatic anemia   Melena   Acute post-hemorrhagic anemia   DIC (disseminated intravascular coagulation) (HCC)  Melena and Symptomatic Anemia likely from Dental Procedure and Bleeding Gums in the setting of DIC -Visible dark stools, Hcult positive at the ED   -IV Protonix drip was started by EDP and changed to po Protonix by Gastroenterology -NPO until GI eval then advance diet; SLP consulted for evaluation -Hold NSAIDs/ ASA -Anemia panel as Below -CT A/P with oral contrast r/o colitis or bleeding sources not done this time and was done recently as GI wanted MRI of Abdomen to further evaluate Mass on Kidney  DIC in the setting of known splenomegaly, and current bleeding issues, with gingival bleeding and significant bruising  -Checked DIC panel; D-Dimer was >20.00 and Fibrinogen was 136 -PT was 17.0 and INR was 1.39; APTT was 35 -Smear Schistocytes  -C/w IVF Rehydration -Discussed Case with Dr. Burr Medico of Hematology and for now continue supportive care and Monitor CBC's q8h and provide Blood Transfusions as Necessary -Dr. Burr Medico to evaluate the patient and make further recommendations  -C/w Unasyn for Infection Coverage   Anemia of chronic disease and ABLA s/p Transfusion of 2 units of pRBCs -Hemoglobin on admission 7.9 Hcult + ; Hb/Hct Trended down to 6.3/19.1 so patient was transfused 2 units of pRBCs -Anemia Panel showed Iron of 29, UIBC of 161, TIBC of 190, Saturation Ratios of 15, Ferritin of 735, Folate of 21.9 and Vitamin B12 of 3,381 -Repeat Hb/Hct after Blood  transfusion was 8.5/26.2 -Gastroenterology Consulted and patient underwent EGD -Continue to Monitor for S/Sx of Bleeding  -C/w Iron Supplementation with Ferrous Sulfate 325 mg po BID -Repeat CBC q8h per Hematology Reccs   Pancytopenia -WBC was 2.4, Hb/Hct was 8.5/26.2, and Platelet Count was 60 -Continue to Monitor and Repeat CBC q8h -As  above  Acute encephalopathy, improved per Wife appears Baseline -Etiology unclear. UA was negative.  -WBC normal on admission and now decreased -Afebrile. Prior h/o TIA  -CT head r/o masses or bleeding showed Senescent changes without acute or reversible finding. -Vitamin B12 was normal as well as Ammonia Level -C/w IVF Rehydration -Unasyn for infection coverage   AKI on CKD Stage 2 -Improved with IVF Rehydration and Blood Administration] -BUN/Cr went from 29/1.24 -> 21/1.11 -Continue to Monitor and Repeat CMP in AM   Hypertension Continue home anti-hypertensive medications  Add Hydralazine Q6 hours as needed for BP 160/90    Hyperlipidemia -Continue home Atorvastatin 10 mg po Daily  Hypothyroidism: -Continue home Synthroid -Checked TSH and was 1.351  Dementia,  -not on meds at this time   DVT prophylaxis: SCDs Code Status: DO NOT RESUSCITATE Family Communication: Discussed with Family at bedside Disposition Plan: Remain Inpatient until futher workup is concluded   Consultants:   Gastroenterology Dr. Hilarie Fredrickson   Hematology Dr. Burr Medico   Procedures:  EGD Findings:      The examined esophagus was normal.      The entire examined stomach was normal.      A large non-bleeding diverticulum was found in the area of the papilla.      The exam of the duodenum was otherwise normal. Impression:               - Old, dried blood in the mouth without active                            bleeding (recent gingival bleeding treated by                            endodontist).                           - Normal esophagus.                           - Normal stomach.                           - Non-bleeding duodenal diverticulum.                           - Otherwise normal duodenum.                           - No evidence of upper GI bleeding.                           - No specimens collected.   Antimicrobials:  Anti-infectives    Start     Dose/Rate Route Frequency Ordered  Stop   08/02/17 1300  Ampicillin-Sulbactam (UNASYN) 3 g in sodium chloride 0.9 % 100 mL IVPB     3 g 200 mL/hr over 30 Minutes Intravenous Every 8  hours 08/02/17 1230       Subjective: Seen and examined at bedside and was incomprehensible. Wife reports no more bloody bowel movements. No other complaints or concerns.   Objective: Vitals:   08/03/17 1400 08/03/17 1452 08/03/17 1520 08/03/17 1826  BP: (!) 142/62 (!) 118/51 138/64 130/61  Pulse: 91 79 88 80  Resp: 19 19 18 18   Temp: 98 F (36.7 C) 98.1 F (36.7 C) 98.3 F (36.8 C) 98.7 F (37.1 C)  TempSrc: Axillary Oral Oral Oral  SpO2: 98% 99% 100% 100%  Weight:      Height:        Intake/Output Summary (Last 24 hours) at 08/03/17 2037 Last data filed at 08/03/17 1528  Gross per 24 hour  Intake              486 ml  Output             1450 ml  Net             -964 ml   Filed Weights   08/02/17 1200 08/02/17 1642  Weight: 53.5 kg (117 lb 15.1 oz) 51.1 kg (112 lb 11.2 oz)   Examination: Physical Exam:  Constitutional: Frail thin Caucasian male who is incomprehensible and chronically ill appearing Eyes: Lids and conjunctivae pale and Left eye opacity  ENMT: External Ears, Nose appear normal. Hard of hearing. Poor dentition with blood in mouth  Neck: Appears normal, supple, no cervical masses, normal ROM, no appreciable thyromegaly, no JVD Respiratory: Diminished to auscultation bilaterally, no wheezing, rales, rhonchi or crackles. Normal respiratory effort and patient is not tachypenic. No accessory muscle use.  Cardiovascular: RRR, no murmurs / rubs / gallops. S1 and S2 auscultated. No extremity edema.  Abdomen: Soft, non-tender, non-distended. No masses palpated. Has some spleenomegaly.  GU: Deferred. Musculoskeletal: No clubbing / cyanosis of digits/nails. No joint deformity upper and lower extremities.  Skin: Has severe bruising. No induration; Warm and dry.  Neurologic: Incomprehensible. Able to understand some. No  gross focal deficits. Psychiatric: . Awake. Slightly confused.    Data Reviewed: I have personally reviewed following labs and imaging studies  CBC:  Recent Labs Lab 08/02/17 0850 08/02/17 1154 08/02/17 1708 08/02/17 1912 08/03/17 0013 08/03/17 0939  WBC 7.4  --   --   --  2.3* 2.4*  NEUTROABS  --   --   --   --   --  1.6*  HGB 7.9*  --  6.5* 6.3* 6.2* 8.5*  HCT 23.1*  --  19.5* 19.1* 18.8* 26.2*  MCV 85.2  --   --   --  86.2 87.0  PLT 113* 73*  --   --  54* 60*   Basic Metabolic Panel:  Recent Labs Lab 08/02/17 0850 08/03/17 0013 08/03/17 0939  NA 141 140 143  K 3.8 3.5 3.7  CL 108 111 111  CO2 22 21* 21*  GLUCOSE 133* 99 90  BUN 43* 29* 21*  CREATININE 1.60* 1.24 1.11  CALCIUM 9.2 8.2* 8.4*  MG  --   --  2.1  PHOS  --   --  3.1   GFR: Estimated Creatinine Clearance: 43.5 mL/min (by C-G formula based on SCr of 1.11 mg/dL). Liver Function Tests:  Recent Labs Lab 08/02/17 0850 08/03/17 0013 08/03/17 0939  AST 28 25 29   ALT 18 16* 17  ALKPHOS 51 38 41  BILITOT 0.7 0.7 0.6  PROT 6.1* 4.8* 5.3*  ALBUMIN 3.9 3.0* 3.2*   No results  for input(s): LIPASE, AMYLASE in the last 168 hours.  Recent Labs Lab 08/02/17 1154  AMMONIA 10   Coagulation Profile:  Recent Labs Lab 08/02/17 1154 08/03/17 0013  INR 1.32  1.27 1.39   Cardiac Enzymes:  Recent Labs Lab 08/02/17 1154  TROPONINI <0.03   BNP (last 3 results) No results for input(s): PROBNP in the last 8760 hours. HbA1C: No results for input(s): HGBA1C in the last 72 hours. CBG: No results for input(s): GLUCAP in the last 168 hours. Lipid Profile: No results for input(s): CHOL, HDL, LDLCALC, TRIG, CHOLHDL, LDLDIRECT in the last 72 hours. Thyroid Function Tests:  Recent Labs  08/02/17 1154  TSH 1.351   Anemia Panel:  Recent Labs  08/02/17 1154  VITAMINB12 3,381*  FOLATE 21.9  FERRITIN 735*  TIBC 190*  IRON 29*  RETICCTPCT 1.2   Sepsis Labs: No results for input(s):  PROCALCITON, LATICACIDVEN in the last 168 hours.  Recent Results (from the past 240 hour(s))  Culture, blood (Routine X 2) w Reflex to ID Panel     Status: None (Preliminary result)   Collection Time: 08/02/17  5:08 PM  Result Value Ref Range Status   Specimen Description BLOOD RIGHT ARM  Final   Special Requests IN PEDIATRIC BOTTLE Blood Culture adequate volume  Final   Culture NO GROWTH < 24 HOURS  Final   Report Status PENDING  Incomplete  Culture, blood (Routine X 2) w Reflex to ID Panel     Status: None (Preliminary result)   Collection Time: 08/02/17  5:27 PM  Result Value Ref Range Status   Specimen Description BLOOD RIGHT ARM  Final   Special Requests IN PEDIATRIC BOTTLE Blood Culture adequate volume  Final   Culture NO GROWTH < 24 HOURS  Final   Report Status PENDING  Incomplete    Radiology Studies: Ct Head Wo Contrast  Result Date: 08/02/2017 CLINICAL DATA:  Altered level of consciousness that is unexplained. Speech difficulty. Generalized decline EXAM: CT HEAD WITHOUT CONTRAST TECHNIQUE: Contiguous axial images were obtained from the base of the skull through the vertex without intravenous contrast. COMPARISON:  Brain MRI 01/08/2012 FINDINGS: Brain: No evidence of acute infarction, hemorrhage, hydrocephalus, extra-axial collection or mass lesion/mass effect. Low cerebellar tonsils without significant mass effect. Generalized cerebral volume loss with mild progression since 2013. Vascular: No hyperdense vessel or unexpected calcification. Skull: No acute or aggressive finding. Sinuses/Orbits: No acute finding.  Bilateral cataract resection. IMPRESSION: Senescent changes without acute or reversible finding. Electronically Signed   By: Monte Fantasia M.D.   On: 08/02/2017 15:51   Dg Chest Port 1 View  Result Date: 08/02/2017 CLINICAL DATA:  Weakness.  Acute encephalopathy. EXAM: PORTABLE CHEST 1 VIEW COMPARISON:  01/08/2012 FINDINGS: Cardiomediastinal silhouette is normal.  Mediastinal contours appear intact. Tortuosity of the aorta noted. There is no evidence of focal airspace consolidation, pleural effusion or pneumothorax. Osseous structures are without acute abnormality. Soft tissues are grossly normal. IMPRESSION: No active disease. Electronically Signed   By: Fidela Salisbury M.D.   On: 08/02/2017 11:38   Scheduled Meds: . amLODipine  10 mg Oral q morning - 10a  . atorvastatin  10 mg Oral q morning - 10a  . feeding supplement  1 Container Oral TID BM  . ferrous sulfate  325 mg Oral BID WC  . levothyroxine  112 mcg Oral QAC breakfast  . losartan  25 mg Oral q morning - 10a  . multivitamin with minerals  1 tablet Oral Daily  . [  START ON 08/04/2017] pantoprazole  40 mg Oral Q0600  . vitamin B-12  1,000 mcg Oral Daily   Continuous Infusions: . sodium chloride 100 mL/hr at 08/02/17 1659  . ampicillin-sulbactam (UNASYN) IV Stopped (08/03/17 1614)     LOS: 1 day   Kerney Elbe, DO [facility] Pager [phone]  If 7PM-7AM, please contact night-coverage [email or web address] Password [facility] 08/03/2017, 8:37 PM

## 2017-08-03 NOTE — Progress Notes (Signed)
Initial Nutrition Assessment  DOCUMENTATION CODES:   Severe malnutrition in context of chronic illness, Underweight  INTERVENTION:    Boost Breeze po TID, each supplement provides 250 kcal and 9 grams of protein  NUTRITION DIAGNOSIS:   Malnutrition (severe) related to acute illness, chronic illness (anemia, nausea, diverticulosis ) as evidenced by moderate depletion of body fat, severe depletion of muscle mass, 20% weight loss x 6 months  GOAL:   Patient will meet greater than or equal to 90% of their needs  MONITOR:   PO intake, Supplement acceptance, Labs, Weight trends, Skin, I & O's  REASON FOR ASSESSMENT:   Consult Assessment of nutrition requirement/status  ASSESSMENT:   71 yo Male admitted with weakness, one incident of melena and AMS - recent dental extraction.  Concern for GI bleed present.  Imaging study showed senescent changes in brain without acute findings and cxr negative.  PMH mild splenomegaly predating ultrasound of 2014 which showed gallstones, mild splenomegaly, normal liver.  Pt s/p procedure 8/31: UPPER GI ENDOSCOPY  RD spoke with pt's wife at bedside. Reports pt was essentially not eating PTA. He would maybe consume some oatmeal or mashed potatoes. Wife reveals he started to experience a decreased appetite approximately 6 months ago.  She also shares he's lost ~ 27 lbs since February. Severe for time frame. Doesn't like Ensure and/or Boost supplements.  Try Boost Breeze. Labs and medications reviewed.  Nutrition-Focused physical exam completed. Findings are severe fat depletion, severe muscle depletion, and no edema.   Diet Order:  Diet full liquid Room service appropriate? Yes; Fluid consistency: Thin  Skin:  Reviewed, no issues  Last BM:  8/30  Height:   Ht Readings from Last 1 Encounters:  08/02/17 5\' 10"  (1.778 m)    Weight:   Wt Readings from Last 1 Encounters:  08/02/17 112 lb 11.2 oz (51.1 kg)    Ideal Body Weight:  75.4  kg  BMI:  Body mass index is 16.17 kg/m.  Estimated Nutritional Needs:   Kcal:  1500-1700  Protein:  70-85 gm  Fluid:  1.5-1.7 L  EDUCATION NEEDS:   No education needs identified at this time  Arthur Holms, RD, LDN Pager #: [phone] After-Hours Pager #: [phone]

## 2017-08-03 NOTE — Anesthesia Preprocedure Evaluation (Addendum)
Anesthesia Evaluation  Patient identified by MRN, date of birth, ID band Patient awake    Reviewed: Allergy & Precautions, NPO status , Patient's Chart, lab work & pertinent test results  Airway Mallampati: II  TM Distance: >3 FB Neck ROM: Full    Dental  (+) Poor Dentition, Loose   Pulmonary neg pulmonary ROS,    Pulmonary exam normal        Cardiovascular hypertension, Pt. on medications  Rhythm:Regular Rate:Normal     Neuro/Psych TIA   GI/Hepatic Neg liver ROS, melena   Endo/Other  Hypothyroidism   Renal/GU Renal disease     Musculoskeletal  (+) Arthritis ,   Abdominal   Peds  Hematology  (+) anemia ,   Anesthesia Other Findings   Reproductive/Obstetrics                            Anesthesia Physical Anesthesia Plan  ASA: III  Anesthesia Plan: MAC   Post-op Pain Management:    Induction: Intravenous  PONV Risk Score and Plan: 2 and Ondansetron, Propofol infusion and Treatment may vary due to age or medical condition  Airway Management Planned: Natural Airway and Nasal Cannula  Additional Equipment:   Intra-op Plan:   Post-operative Plan:   Informed Consent: I have reviewed the patients History and Physical, chart, labs and discussed the procedure including the risks, benefits and alternatives for the proposed anesthesia with the patient or authorized representative who has indicated his/her understanding and acceptance.     Plan Discussed with: CRNA  Anesthesia Plan Comments:         Anesthesia Quick Evaluation

## 2017-08-03 NOTE — Anesthesia Procedure Notes (Signed)
Procedure Name: MAC Date/Time: 08/03/2017 2:25 PM Performed by: Rush Farmer E Pre-anesthesia Checklist: Patient identified, Emergency Drugs available, Suction available and Patient being monitored Patient Re-evaluated:Patient Re-evaluated prior to induction Oxygen Delivery Method: Nasal cannula Induction Type: IV induction Placement Confirmation: positive ETCO2 Dental Injury: Teeth and Oropharynx as per pre-operative assessment

## 2017-08-03 NOTE — Progress Notes (Signed)
2 units of RBC's completed. Pt with no signs or symptoms of reaction. He denies pain or discomfort. Will continue to monitor. H&H ordered per night nurse report.

## 2017-08-03 NOTE — Evaluation (Signed)
Speech Language Pathology Evaluation Patient Details Name: Kyle Espinoza MRN: [id number] DOB: [birth year] Today's Date: 08/03/2017 Time: 0737-1062 SLP Time Calculation (min) (ACUTE ONLY): 17 min  Problem List:  Patient Active Problem List   Diagnosis Date Noted  . Pancytopenia ([place]) 08/02/2017  . Splenomegaly 08/02/2017  . Hyperlipemia 08/02/2017  . Legally blind 08/02/2017  . CKD (chronic kidney disease) 08/02/2017  . Debility 08/02/2017  . Parkinsonism ([place]) 08/02/2017  . GIB (gastrointestinal bleeding) 08/02/2017  . Symptomatic anemia 08/02/2017  . Combined forms of age-related cataract of left eye 11/04/2015  . TIA (transient ischemic attack) 01/09/2012  . Slurred speech 01/08/2012  . Hypertension 01/08/2012   Past Medical History:  Past Medical History:  Diagnosis Date  . Anemia   . CKD (chronic kidney disease), stage II   . 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 History of adenomatous polyp of colon    hyperplastic  2013  . History of kidney stones   . History of transient ischemic attack (TIA)    01-07-2012  . Hyperlipidemia   . Hypertension   . Hypothyroidism   . Impairment of balance   . 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 OA (osteoarthritis)   . Pancytopenia ([place])   . Parkinsonism ([place])    UNSPECIFIED type --  NEUROLOGIST-- DR Star Age  . Splenomegaly   . Ureteral tumor    left upper tract  . Wears dentures    full upper and partial lower  . Wears glasses   . Wears hearing aid    bilateral hears ---  very hoh over phone   Past Surgical History:  Past Surgical History:  Procedure Laterality Date  . ANTERIOR CERVICAL DECOMP/DISCECTOMY FUSION  06/04/2008   C4 -- C7  . CATARACT EXTRACTION W/ INTRAOCULAR LENS  IMPLANT, BILATERAL  2016  . COLONOSCOPY  last one 07-26-2012  . CYSTOSCOPY WITH RETROGRADE PYELOGRAM, URETEROSCOPY AND STENT PLACEMENT Left 08/10/2016   Procedure: CYSTOSCOPY WITH LEFT  RETROGRADE  PYELOGRAM, URETEROSCOPY URETERAL BIOPSY,  AND STENT PLACEMENT;  Surgeon: Ardis Hughs, MD;  Location: [facility];  Service: Urology;  Laterality: Left;  . CYSTOSCOPY/RETROGRADE/URETEROSCOPY/STONE EXTRACTION WITH BASKET  1987  . INGUINAL HERNIA REPAIR Right 1980's  . TRANSTHORACIC ECHOCARDIOGRAM  01/09/2012   mild focal basal LVH,  ef 55-60%,  grade 1 diastolic dysfunction/  mild AV stenosis , no regurg. , valve area 2.13cm/m^2 /  atrial septum lipomatous hypertrophy/  mild TR   HPI:  Pt is a 71 yo male adm with weakness, one incident of melena and AMS - recent dental extraction.  Concern for GI bleed present.  Imaging study showed senescent changes in brain without acute findings and cxr negative.  PMH mild splenomegaly predating ultrasound of 2014 which showed gallstones, mild splenomegaly, normal liver.  Thrombocytopenia dating to 2013 and platelets 74 in 11/2016.  Seen by Dr Marin Olp and in 2015, no further heme/onc investigation felt necessary.  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  Less than 50% bil carotid stenosis on dopplers 2014.  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 LVEF 60% and grade 1 diastolic dysfunction on 6948 Echo.  Anemia, normocytic 07/2016, on chronic twice a day iron for at least a year and a half.  Speech evaluation ordrered.    Assessment / Plan / Recommendation Clinical Impression  Limited cognitive linguistic evaluation completed with patient as MD arrived  to see pt.  Pt presents with severe dysarthria - which wife reports has progressed in six months - suspect due to his Parkinsonism.   He demonstrates rapid rate of imprecise articulation with decreased phonatory strength.  He required total cues to slow rate, overarticulate and chunk words which improved his intelligiblity significantly.  Pt did compensate for dysarthria by spelling out word "stupidity" when asked why he was admitted to the hospital.   Pt  with lingual coating - black colored and appearance of pill on tongue, very xerostomic likely worsens dysphagia significantly.    Requested to RN that she set up oral suction for use.  SLP will follow up to help maximize speech intelligibility.   Educated family to need turn off background noise to maximize functional communication.  As pt has dysarthria - and per notes dysphagia prior to admit, recommend SLP swallow evaluation.      SLP Assessment  SLP Recommendation/Assessment: Patient needs continued Speech Lanaguage Pathology Services SLP Visit Diagnosis: Dysarthria and anarthria (R47.1)    Follow Up Recommendations  Home health SLP    Frequency and Duration min 2x/week  1 week      SLP Evaluation Cognition  Arousal/Alertness: Awake/alert Orientation Level: Oriented to person;Oriented to place;Disoriented to time;Disoriented to situation Attention: Sustained Sustained Attention: Appears intact Memory:  (did not recall reason for admit but wife reports pt was "out of it") Awareness: Impaired Awareness Impairment: Intellectual impairment (decreased awareness to speech deficits) Problem Solving: Appears intact Comments: wife reports pt manages his own medications at home prior to admission, will follow up for functional cognitive lingusitic assessment       Comprehension  Auditory Comprehension Overall Auditory Comprehension: Appears within functional limits for tasks assessed Commands: Within Functional Limits (for basic directions) Conversation: Simple Interfering Components: Hearing (left ear better) Visual Recognition/Discrimination Discrimination: Not tested Reading Comprehension Reading Status: Not tested (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 per spouse)    Expression Expression Primary Mode of Expression: Verbal Verbal Expression Initiation: No impairment Naming: Not tested Pragmatics: No impairment Interfering Components: Speech intelligibility Effective  Techniques:  (spelling out word) Written Expression Written Expression: Not tested   Oral / Motor  Oral Motor/Sensory Function Overall Oral Motor/Sensory Function: Generalized oral weakness Motor Speech Overall Motor Speech: Impaired at baseline Respiration: Impaired Phonation: Low vocal intensity Articulation: Impaired Intelligibility: Intelligibility reduced Word: 75-100% accurate Phrase: 50-74% accurate Sentence: 25-49% accurate Conversation: Not tested Motor Planning: Not tested Motor Speech Errors: Not applicable Interfering Components: Premorbid status;Hearing loss;Anatomical limitations Effective Techniques: Slow rate;Increased vocal intensity;Over-articulate;Pause   GO                    Macario Golds 08/03/2017, 12:47 PM  Luanna Salk, [facility] SLP [phone]

## 2017-08-03 NOTE — Progress Notes (Signed)
Bedside report received on patient. Lying quietly, no complaints at this time. Speech incomprehensible. Pale. Bed alarm on. Safety maintained.

## 2017-08-03 NOTE — Progress Notes (Signed)
OT Note  - Addendum  Pt cancelled as indicated in previous OT note.  [facility], OT/L  [phone] 08/03/2017

## 2017-08-03 NOTE — Progress Notes (Signed)
PT Cancellation Note  Patient Details Name: Kyle Espinoza MRN: [id number] DOB: [birth year]   Cancelled Treatment:    Reason Eval/Treat Not Completed: Patient at procedure or test/unavailable Pt currently at procedure. Will follow up as pt becomes available and as schedule allows.   Leighton Ruff, PT, DPT  Acute Rehabilitation Services  Pager: [phone]    [name] 08/03/2017, 2:48 PM

## 2017-08-03 NOTE — Op Note (Signed)
[facility] Patient Name: Kyle Espinoza Procedure Date : 08/03/2017 MRN: [id number] Attending MD: Jerene Bears , MD Date of Birth: 05-07-1946 CSN: [id number] Age: 71 Admit Type: Inpatient Procedure:                Upper GI endoscopy Indications:              Acute post hemorrhagic anemia, Melena Providers:                Lajuan Lines. Hilarie Fredrickson, MD, Cleda Daub, RN, Alan Mulder, Technician Referring MD:             [facility] Medicines:                Monitored Anesthesia Care Complications:            No immediate complications. Estimated Blood Loss:     Estimated blood loss: none. Procedure:                Pre-Anesthesia Assessment:                           - Prior to the procedure, a History and Physical                            was performed, and patient medications and                            allergies were reviewed. The patient's tolerance of                            previous anesthesia was also reviewed. The risks                            and benefits of the procedure and the sedation                            options and risks were discussed with the patient.                            All questions were answered, and informed consent                            was obtained. Prior Anticoagulants: The patient has                            taken no previous anticoagulant or antiplatelet                            agents. ASA Grade Assessment: III - A patient with                            severe systemic disease. After reviewing the risks  and benefits, the patient was deemed in                            satisfactory condition to undergo the procedure.                           After obtaining informed consent, the endoscope was                            passed under direct vision. Throughout the                            procedure, the patient's blood pressure, pulse, and          oxygen saturations were monitored continuously. The                            EG-[id number] ([id number]) scope was introduced through the                            mouth, and advanced to the second part of duodenum.                            The upper GI endoscopy was accomplished without                            difficulty. The patient tolerated the procedure                            well. Scope In: Scope Out: Findings:      The examined esophagus was normal.      The entire examined stomach was normal.      A large non-bleeding diverticulum was found in the area of the papilla.      The exam of the duodenum was otherwise normal. Impression:               - Old, dried blood in the mouth without active                            bleeding (recent gingival bleeding treated by                            endodontist).                           - Normal esophagus.                           - Normal stomach.                           - Non-bleeding duodenal diverticulum.                           - Otherwise normal duodenum.                           -  No evidence of upper GI bleeding.                           - No specimens collected. Moderate Sedation:      N/A Recommendation:           - Return patient to hospital ward for ongoing care.                           - Resume previous diet.                           - Continue present medications, but can change to                            once daily PPI versus H2 blocker while here.                           - Monitor Hgb                           - Follow-up MRI abdomen/pelvis. Procedure Code(s):        --- Professional ---                           [phone], Esophagogastroduodenoscopy, flexible,                            transoral; diagnostic, including collection of                            specimen(s) by brushing or washing, when performed                            (separate procedure) Diagnosis Code(s):        ---  Professional ---                           D62, Acute posthemorrhagic anemia                           K92.1, Melena (includes Hematochezia)                           K57.10, Diverticulosis of small intestine without                            perforation or abscess without bleeding CPT copyright 2016 American [employer] Association. All rights reserved. The codes documented in this report are preliminary and upon coder review may  be revised to meet current compliance requirements. Jerene Bears, MD 08/03/2017 2:49:57 PM This report has been signed electronically. Number of Addenda: 0

## 2017-08-03 NOTE — H&P (View-Only) (Signed)
[facility] Consult: 11:28 AM 08/02/2017  LOS: 0 days    Referring Provider: DR Aggie Moats in ED  Primary Care Physician:  Deland Pretty, MD Primary Gastroenterologist:  Dr. Ardis Hughs   Reason for Consultation:  Melena, anemia.  Oral bleeding. Anorexia. Weight loss.     HPI: Zaidan Keeble Eberly is a 71 y.o. male.  PMH mild splenomegaly predating ultrasound of 2014 which showed gallstones, mild splenomegaly, normal liver.  Thrombocytopenia dating to 2013 and platelets 74 in 11/2016.  Seen by Dr Marin Olp and in 2015, no further heme/onc investigation felt necessary.  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  Less than 50% bil carotid stenosis on dopplers 2014.  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 LVEF 60% and grade 1 diastolic dysfunction on 3235 Echo.  Anemia, normocytic 07/2016, on chronic twice a day iron for at least a year and a half..  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  Hearing loss.  Parkinsonism.  Hypothyroidism.  Surgeries for kidney stones, inguinal hernia repair, cervical spine.    2003 Colonoscopy by Dr Lajoyce Corners: Hyperlastic polyp.   07/2012 Colonoscopy by Dr Ardis Hughs. Avg risk screening.  Normal study.    Seen in the ED on 8/9 for hematuria and dark urine. He was on Cipro for UTI at the time. 07/10/2017 CT abd/pelvis w/o contrast for eval of hematuria: Heterogeneous appearance of the left renal pelvis and proximal left ureter with questionable soft tissue mass versus layering sludge within the dependent portion of the left renal pelvis. Normal sized spleen.  Lack of IV contrast precludes detailed evaluation of this abnormality.  U/A revealed ongoing signs of infection and he was treated with a dose of Rocephin and sent home with prescription for Keflex.  PA noted anemia with hemoglobin  of 9.8 at the time.  For about 6 months patient's had anorexia. Wife says he's just lost his appetite. He doesn't complain of nausea, abdominal pain, heartburn patient does say recently, maybe for a few weeks he's had increased difficulty swallowing but wife has not observed any coughing or gagging with PO. His weight has dropped from 140 # to current 113# over 6 months.  For 3 or 4 months he's had increased difficulty speaking. He's become progressively weaker. He shuffles when he walks. He has not fallen down.  On 8/27 endodontal history removed 4 front teeth. Afterwards he bled profusely.  Once the bleeding began, his stools became black and soft. He had no nausea, vomiting or GI distress. He had been taking fish oil and full dose aspirin before and after the procedure. He returned to the endodontist yesterday who removed the partial denture that had been placed on Monday, the bleeding sockets were treated with some sort of hemostatic topical therapy and the bleeding has pretty much ceased as of this morning.  He didn't eat or drink anything other than taking his meds yesterday. Because he was getting quite weak and his wife was certain he was dehydrated, she carried him to the ED this morning and had to help him with  significant effort to get him to the car  Hgb 7.9, was 11.5 on 03/09/17, 9.8 in 07/2016.   MCV is normal.  Platelets 113, were 87 4.5 months ago. FOBT +.    ?AKI vs progression of stage 2 CKD, BUN/Creat 43/1.6.  LFTs normal.  D dimer elevated, fibrinogen decreased, PT 16.2.  INR and PTT are normal.    Past Medical History:  Diagnosis Date  . Anemia   . CKD (chronic kidney disease), stage II   . 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 History of adenomatous polyp of colon    hyperplastic  2013  . History of kidney stones   . History of transient ischemic attack (TIA)    01-07-2012  . Hyperlipidemia   . Hypertension   . Hypothyroidism   . Impairment of balance   .  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 OA (osteoarthritis)   . Pancytopenia ([place])   . Parkinsonism ([place])    UNSPECIFIED type --  NEUROLOGIST-- DR Star Age  . Splenomegaly   . Ureteral tumor    left upper tract  . Wears dentures    full upper and partial lower  . Wears glasses   . Wears hearing aid    bilateral hears ---  very hoh over phone    Past Surgical History:  Procedure Laterality Date  . ANTERIOR CERVICAL DECOMP/DISCECTOMY FUSION  06/04/2008   C4 -- C7  . CATARACT EXTRACTION W/ INTRAOCULAR LENS  IMPLANT, BILATERAL  2016  . COLONOSCOPY  last one 07-26-2012  . CYSTOSCOPY WITH RETROGRADE PYELOGRAM, URETEROSCOPY AND STENT PLACEMENT Left 08/10/2016   Procedure: CYSTOSCOPY WITH LEFT  RETROGRADE PYELOGRAM, URETEROSCOPY URETERAL BIOPSY,  AND STENT PLACEMENT;  Surgeon: Ardis Hughs, MD;  Location: [facility];  Service: Urology;  Laterality: Left;  . CYSTOSCOPY/RETROGRADE/URETEROSCOPY/STONE EXTRACTION WITH BASKET  1987  . INGUINAL HERNIA REPAIR Right 1980's  . TRANSTHORACIC ECHOCARDIOGRAM  01/09/2012   mild focal basal LVH,  ef 55-60%,  grade 1 diastolic dysfunction/  mild AV stenosis , no regurg. , valve area 2.13cm/m^2 /  atrial septum lipomatous hypertrophy/  mild TR    Prior to Admission medications   Medication Sig Start Date End Date Taking? Authorizing Provider  amLODipine (NORVASC) 10 MG tablet Take 10 mg by mouth every morning.     [provider]  aspirin EC 325 MG EC tablet Take 1 tablet (325 mg total) by mouth daily. 01/09/12   Dhungel, Nishant, MD  atorvastatin (LIPITOR) 10 MG tablet Take 10 mg by mouth every morning.     [provider]  carbidopa-levodopa (SINEMET IR) 25-100 MG tablet Take 1 tablet by mouth 3 (three) times daily. Patient not taking: Reported on 07/12/2017 09/29/15   Star Age, MD  Cyanocobalamin (B-12) 1000 MCG SUBL Place 1 tablet under the tongue daily.     [provider]    docusate sodium (COLACE) 100 MG capsule Take 100 mg by mouth every morning.    [provider]  Ferrous Sulfate (IRON) 325 (65 Fe) MG TABS Take 1 tablet by mouth 2 (two) times daily.    [provider]  levothyroxine (SYNTHROID, LEVOTHROID) 112 MCG tablet Take 112 mcg by mouth daily before breakfast.     [provider]  losartan (COZAAR) 25 MG tablet Take 25 mg by mouth every morning.    [provider]  Multiple Vitamin (MULTIVITAMIN) tablet Take  1 tablet by mouth daily.    [provider]  Omega-3 Fatty Acids (FISH OIL) 1000 MG CAPS Take 3,000 mg by mouth 2 (two) times daily.    [provider]    Scheduled Meds:  Infusions: . pantoprazole (PROTONIX) IV 80 mg (08/02/17 1125)  . pantoprozole (PROTONIX) infusion     PRN Meds:    Allergies as of 08/02/2017 - Review Complete 08/02/2017  Allergen Reaction Noted  . Niacin and related Nausea And Vomiting and Other (See Comments) 02/19/2014  . Phenergan [promethazine hcl] Other (See Comments) 07/17/2012  . Valium [diazepam] Other (See Comments) 07/17/2012  . Azithromycin Rash 01/07/2012    Family History  Problem Relation Age of Onset  . Diabetes Sister     Social History   Social History  . Marital status: Single    Spouse name: N/A  . Number of children: 5  . Years of education: 12   Occupational History  . Retired     Social History Main Topics  . Smoking status: Never Smoker  . Smokeless tobacco: Never Used  . Alcohol use No  . Drug use: No  . Sexual activity: Not on file   Other Topics Concern  . Not on file   Social History Narrative   Denies caffeine use     REVIEW OF SYSTEMS: Constitutional:  weakness ENT:  No nose bleeds.  Oral bleeding post extractions as above Pulm:  No cough, no SOB CV:  No palpitations, no LE edema.  No chest pain.    GU:  No hematuria, no frequency GI:  Per HPI Heme:  Bruises easily.     Transfusions:  None.   Neuro:  No  headaches, no peripheral tingling or numbness Derm:  No itching, no rash or sores.  Endocrine:  No sweats or chills.  No polyuria or dysuria Immunization:  Did not inquire as to previous immunizations. Travel:  None beyond local counties in last few months.    PHYSICAL EXAM: Vital signs in last 24 hours: Vitals:   08/02/17 1030 08/02/17 1100  BP: (!) 133/105 121/63  Pulse: 80 74  Resp: 19 18  Temp:    SpO2: 100% 100%   Wt Readings from Last 3 Encounters:  07/12/17 53.5 kg (118 lb)  03/09/17 60.3 kg (132 lb 14.4 oz)  11/09/16 65 kg (143 lb 6.4 oz)    General: Severely cachectic appearing aged WM. Looks chronically ill.  Speech is very mumbled and difficult to understand, his wife helped in "interpreting" for the patient Head:  No asymmetry, no swelling, no signs of head trauma. Prominent facial bones and temporal wasting consistent with significant weight loss  Eyes:  Somewhat sunken. No scleral icterus. No conjunctival pallor. Ears:  Hard of hearing.  Nose:  No discharge or congestion. Mouth:  Lower lip swollen. Full upper denture plate was not removed for exam. Oral mucosa is dry. There is a layer of congealed blood overlying the sites of dental extraction in the front of the lower jaw. Just a few native teeth remain in the lower jaw. Neck:  No JVD, no masses, no thyromegaly. Lungs:  Clear bilaterally but patient unable to perform deep inspiration. No cough. No dyspnea. Heart: RRR. No MRG. S1, S2 present. Abdomen:  Thin, soft. No masses or HSM.Marland Kitchen   Rectal: DRE performed by ED staff's. Stool was black and FOBT positive. I did not repeat the exam   Musc/Skeltl: Arthritic changes in the fingers. Extremities:  No CCE. Pedal perfusion  brisk.  Neurologic:  Limb strength full with perhaps slightly diminished strength in the left leg.. Slight upper extremity tremor, more pronounced with movement.  Unable to tell me the year. Knows he is in the hospital. Alert. Blank, staring affect.  Not  able to follow all commands, not sure if he doesn't understand or he was not able to hear me due to his diminished hearing Skin:  No obvious sores, rashes or telangiectasia. Tattoos:  None Nodes:  No cervical adenopathy.   Psych:  Flat affect. Calm. Cooperative.  Intake/Output from previous day: No intake/output data recorded. Intake/Output this shift: No intake/output data recorded.  LAB RESULTS:  Recent Labs  08/02/17 0850  WBC 7.4  HGB 7.9*  HCT 23.1*  PLT 113*   BMET Lab Results  Component Value Date   NA 141 08/02/2017   NA 141 11/03/2016   NA 136 07/12/2016   K 3.8 08/02/2017   K 4.1 11/03/2016   K 4.2 07/12/2016   CL 108 08/02/2017   CL 105 07/12/2016   CL 104 02/19/2014   CO2 22 08/02/2017   CO2 22 11/03/2016   CO2 24 07/12/2016   GLUCOSE 133 (H) 08/02/2017   GLUCOSE 88 11/03/2016   GLUCOSE 111 (H) 07/12/2016   BUN 43 (H) 08/02/2017   BUN 18.8 11/03/2016   BUN 10 07/12/2016   CREATININE 1.60 (H) 08/02/2017   CREATININE 1.3 11/03/2016   CREATININE 1.33 (H) 07/12/2016   CALCIUM 9.2 08/02/2017   CALCIUM 9.1 11/03/2016   CALCIUM 9.6 07/12/2016   LFT  Recent Labs  08/02/17 0850  PROT 6.1*  ALBUMIN 3.9  AST 28  ALT 18  ALKPHOS 51  BILITOT 0.7   PT/INR No results found for: INR, PROTIME Hepatitis Panel No results for input(s): HEPBSAG, HCVAB, HEPAIGM, HEPBIGM in the last 72 hours. C-Diff No components found for: CDIFF Lipase  No results found for: LIPASE    RADIOLOGY STUDIES: No results found.    IMPRESSION:   *  Acute on chronic normocytic anemia.  Hemoglobin down to grams in the last 3 weeks, down almost 4 g in 4.5 months.    *  Melena coinciding with brisk post dental extractions bleeding.    *  Thrombocytopenia, noncritical.  Chronic.  However patient has signs of coagulopathy given the vigorous oral bleeding he has been having and abnormal d dimer and fibrinogen.    *  AKI.  Previous stage 2 CKD.  *  Dental extractions times  4 8/27 with associated vigorous bleeding requiring hemostatic topical therapy at the dentist 8/29.  *  Several months of anorexia with associated 27# weight loss. In the last couple of weeks he has developed dysphagia. No complaints of abdominal pain. No nausea or vomiting.  CT 3 weeks ago with abnormal left kidney/ureter  *  General decline.  *  Speech difficulty. Patient with history of parkinsonism but, according to his wife, did not respond to oral medication so M.D ultimately discontinued it, not sure what medicines he was taking.  *  Elevated d-dimer, diminished fibrinogen. Minor increase PT, normal INR and PTT.      PLAN:     *  Allow clear liquid diet.  *  There will not be much benefit in repeating CT abdomen without contrast. Dr. Hilarie Fredrickson prefers to pursue MRI abdomen which, with his GFR of 41, is > than the GFR 30 that is threshold for allowing MRI with contrast and thus preferable to CT given his AKI.  Obtain this to eval the left renal/ureteral findings of CT on 8/7  *  CT head as well as a chest x-ray are pending.  *  He does not need Protonix drip. I substituted this with twice a day oral Protonix.   Azucena Freed  08/02/2017, 11:28 AM Pager: [phone]

## 2017-08-03 NOTE — Transfer of Care (Signed)
Immediate Anesthesia Transfer of Care Note  Patient: Kyle Espinoza  Procedure(s) Performed: Procedure(s): ESOPHAGOGASTRODUODENOSCOPY (EGD) (N/A)  Patient Location: Endoscopy Unit  Anesthesia Type:MAC  Level of Consciousness: drowsy  Airway & Oxygen Therapy: Patient Spontanous Breathing and Patient connected to nasal cannula oxygen  Post-op Assessment: Report given to RN, Post -op Vital signs reviewed and stable and Patient moving all extremities  Post vital signs: Reviewed and stable  Last Vitals:  Vitals:   08/03/17 1130 08/03/17 1400  BP: 126/66 (!) 142/62  Pulse: 82 91  Resp: 18 19  Temp: 36.6 C 36.7 C  SpO2: 99% 98%    Last Pain:  Vitals:   08/03/17 1400  TempSrc: Axillary  PainSc:          Complications: No apparent anesthesia complications

## 2017-08-03 NOTE — Interval H&P Note (Signed)
History and Physical Interval Note: Pt for EGD to eval melena, acute on chronic anemia, nausea, anorexia and wt loss. Hgb declined further to 6.2 and he received pRBCs with nice improvement in Hgb to 8.5 MRI abd has not yet been performred The nature of the procedure, as well as the risks, benefits, and alternatives were carefully and thoroughly reviewed with the patient. Ample time for discussion and questions allowed. The patient understood, was satisfied, and agreed to proceed.     CBC Latest Ref Rng & Units 08/03/2017 08/03/2017 08/02/2017  WBC 4.0 - 10.5 K/uL 2.4(L) 2.3(L) -  Hemoglobin 13.0 - 17.0 g/dL 8.5(L) 6.2(LL) 6.3(LL)  Hematocrit 39.0 - 52.0 % 26.2(L) 18.8(L) 19.1(L)  Platelets 150 - 400 K/uL 60(L) 54(L) -     08/03/2017 1:56 PM  Kyle Espinoza  has presented today for surgery, with the diagnosis of Anemia, melena, anorexia, weight loss, recent oral bleeding following multiple dental extractions  The various methods of treatment have been discussed with the patient and family. After consideration of risks, benefits and other options for treatment, the patient has consented to  Procedure(s): ESOPHAGOGASTRODUODENOSCOPY (EGD) (N/A) as a surgical intervention .  The patient's history has been reviewed, patient examined, no change in status, stable for surgery.  I have reviewed the patient's chart and labs.  Questions were answered to the patient's satisfaction.     [name] M

## 2017-08-03 NOTE — Consult Note (Signed)
[place]  [phone]   HEMATOLOGY ONCOLOGY INPATIENT CONSULTATION   Kyle Espinoza  DOB: [birth year]  MR#: [id number]  CSN#: [id number]    Requesting Physician: [facility]  Patient Care Team: Deland Pretty, MD as PCP - General (Internal Medicine)  Reason for consult: pancytopenia   History of present illness: 71 year old Caucasian male, with past medical history of chronic mild anemia and thrombocytopenia, last seen by my partner Dr. Alen Blew in 03/2017, Parkinson disease, hypertension, CAD, history of TIA, hypothyroidism, presented to Hospital was black stool, ongoing weight loss, nausea and anorexia. I was called to evaluate his worsening pancytopenia.  Patient is a poor historian, history most from his chart. He apparently had significant bleeding after tooth extraction lately. He was found to have WBC 7.4, hemoglobin 7.9, platelet 113 when he presented to the hospital yesterday. His baseline hemoglobin was around 10-11 in the past year, plt 74-110 since 2008. Repeat his CBC today showed WBC 2.3, hemoglobin 6.2, and platelet 54K. He has old blood in his mouth, no other active bleeding. Lab showed possible DIC.   He has been seen by GI Dr. Hilarie Fredrickson, EGD was done today which was unremarkable. His CT abdomen and pelvis without contrast scan on 07/10/2016 showed heterogeneous appearance of the left renal pelvis and proximal left ureter with questionable soft tissue mass, no further workup was done after that. Abdominal MRI without contrast was ordered today, still pending.  MEDICAL HISTORY:  Past Medical History:  Diagnosis Date  . Anemia   . CKD (chronic kidney disease), stage II   . 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 History of adenomatous polyp of colon    hyperplastic  2013  . History of kidney stones   . History of transient ischemic attack (TIA)    01-07-2012  . Hyperlipidemia   . Hypertension   . Hypothyroidism   . Impairment  of balance   . 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 OA (osteoarthritis)   . Pancytopenia ([place])   . Parkinsonism ([place])    UNSPECIFIED type --  NEUROLOGIST-- DR Star Age  . Splenomegaly   . Ureteral tumor    left upper tract  . Wears dentures    full upper and partial lower  . Wears glasses   . Wears hearing aid    bilateral hears ---  very hoh over phone    SURGICAL HISTORY: Past Surgical History:  Procedure Laterality Date  . ANTERIOR CERVICAL DECOMP/DISCECTOMY FUSION  06/04/2008   C4 -- C7  . CATARACT EXTRACTION W/ INTRAOCULAR LENS  IMPLANT, BILATERAL  2016  . COLONOSCOPY  last one 07-26-2012  . CYSTOSCOPY WITH RETROGRADE PYELOGRAM, URETEROSCOPY AND STENT PLACEMENT Left 08/10/2016   Procedure: CYSTOSCOPY WITH LEFT  RETROGRADE PYELOGRAM, URETEROSCOPY URETERAL BIOPSY,  AND STENT PLACEMENT;  Surgeon: Ardis Hughs, MD;  Location: [facility];  Service: Urology;  Laterality: Left;  . CYSTOSCOPY/RETROGRADE/URETEROSCOPY/STONE EXTRACTION WITH BASKET  1987  . INGUINAL HERNIA REPAIR Right 1980's  . TRANSTHORACIC ECHOCARDIOGRAM  01/09/2012   mild focal basal LVH,  ef 55-60%,  grade 1 diastolic dysfunction/  mild AV stenosis , no regurg. , valve area 2.13cm/m^2 /  atrial septum lipomatous hypertrophy/  mild TR    SOCIAL HISTORY: Social History   Social History  . Marital status: Single    Spouse name: N/A  . Number of children: 5  . Years of  education: 12   Occupational History  . Retired     Social History Main Topics  . Smoking status: Never Smoker  . Smokeless tobacco: Never Used  . Alcohol use No  . Drug use: No  . Sexual activity: Not on file   Other Topics Concern  . Not on file   Social History Narrative   Denies caffeine use     FAMILY HISTORY: Family History  Problem Relation Age of Onset  . Diabetes Sister     ALLERGIES:  is allergic to niacin and related; phenergan [promethazine hcl]; valium [diazepam];  and azithromycin.  MEDICATIONS:  Current Facility-Administered Medications  Medication Dose Route Frequency Provider Last Rate Last Dose  . 0.9 %  sodium chloride infusion   Intravenous Continuous Rondel Jumbo, PA-C 100 mL/hr at 08/02/17 1659    . acetaminophen (TYLENOL) tablet 650 mg  650 mg Oral Q6H PRN Rondel Jumbo, PA-C       Or  . acetaminophen (TYLENOL) suppository 650 mg  650 mg Rectal Q6H PRN Rondel Jumbo, PA-C      . amLODipine (NORVASC) tablet 10 mg  10 mg Oral q morning - 10a Rondel Jumbo, PA-C   10 mg at 08/03/17 1008  . Ampicillin-Sulbactam (UNASYN) 3 g in sodium chloride 0.9 % 100 mL IVPB  3 g Intravenous Q8H Rumbarger, Valeda Malm, RPH   Stopped at 08/03/17 1614  . atorvastatin (LIPITOR) tablet 10 mg  10 mg Oral q morning - 10a Rondel Jumbo, PA-C   10 mg at 08/03/17 1008  . feeding supplement (BOOST / RESOURCE BREEZE) liquid 1 Container  1 Container Oral TID BM Sheikh, Omair Latif, DO      . ferrous sulfate tablet 325 mg  325 mg Oral BID WC Rondel Jumbo, PA-C   325 mg at 08/03/17 0759  . levothyroxine (SYNTHROID, LEVOTHROID) tablet 112 mcg  112 mcg Oral QAC breakfast Rondel Jumbo, PA-C   112 mcg at 08/03/17 0800  . losartan (COZAAR) tablet 25 mg  25 mg Oral q morning - 10a Rondel Jumbo, PA-C   25 mg at 08/03/17 1008  . multivitamin with minerals tablet 1 tablet  1 tablet Oral Daily Elwin Mocha, MD      . Derrill Memo ON 08/04/2017] pantoprazole (PROTONIX) EC tablet 40 mg  40 mg Oral Q0600 Pyrtle, Lajuan Lines, MD      . senna-docusate (Senokot-S) tablet 1 tablet  1 tablet Oral QHS PRN Rondel Jumbo, PA-C      . vitamin B-12 (CYANOCOBALAMIN) tablet 1,000 mcg  1,000 mcg Oral Daily Rondel Jumbo, PA-C        REVIEW OF SYSTEMS:   Constitutional: Denies fevers, chills or abnormal night sweats Eyes: Denies blurriness of vision, double vision or watery eyes Ears, nose, mouth, throat, and face: Denies mucositis or sore throat Respiratory: Denies cough, dyspnea or  wheezes Cardiovascular: Denies palpitation, chest discomfort or lower extremity swelling Gastrointestinal:  Denies nausea, heartburn or change in bowel habits Skin: Denies abnormal skin rashes Lymphatics: Denies new lymphadenopathy or easy bruising Neurological:Denies numbness, tingling or new weaknesses Behavioral/Psych: Mood is stable, no new changes  All other systems were reviewed with the patient and are negative.  PHYSICAL EXAMINATION: ECOG PERFORMANCE STATUS: 3 - Symptomatic, >50% confined to bed  Vitals:   08/03/17 1520 08/03/17 1826  BP: 138/64 130/61  Pulse: 88 80  Resp: 18 18  Temp: 98.3 F (36.8 C) 98.7 F (37.1 C)  SpO2: 100% 100%   Filed Weights   08/02/17 1200 08/02/17 1642  Weight: 117 lb 15.1 oz (53.5 kg) 112 lb 11.2 oz (51.1 kg)    GENERAL:alert, no distress and comfortable SKIN: skin color, texture, turgor are normal, no rashes or significant lesions EYES: normal, conjunctiva are pink and non-injected, sclera clear OROPHARYNX:no exudate, no erythema and lips, buccal mucosa, and tongue normal  NECK: supple, thyroid normal size, non-tender, without nodularity LYMPH:  no palpable lymphadenopathy in the cervical, axillary or inguinal LUNGS: clear to auscultation and percussion with normal breathing effort HEART: regular rate & rhythm and no murmurs and no lower extremity edema ABDOMEN:abdomen soft, non-tender and normal bowel sounds Musculoskeletal:no cyanosis of digits and no clubbing  PSYCH: alert & oriented x 3 with fluent speech NEURO: no focal motor/sensory deficits  LABORATORY DATA:  I have reviewed the data as listed Lab Results  Component Value Date   WBC 2.4 (L) 08/03/2017   HGB 8.5 (L) 08/03/2017   HCT 26.2 (L) 08/03/2017   MCV 87.0 08/03/2017   PLT 60 (L) 08/03/2017    Recent Labs  08/02/17 0850 08/03/17 0013 08/03/17 0939  NA 141 140 143  K 3.8 3.5 3.7  CL 108 111 111  CO2 22 21* 21*  GLUCOSE 133* 99 90  BUN 43* 29* 21*    CREATININE 1.60* 1.24 1.11  CALCIUM 9.2 8.2* 8.4*  GFRNONAA 41* 56* >60  GFRAA 48* >60 >60  PROT 6.1* 4.8* 5.3*  ALBUMIN 3.9 3.0* 3.2*  AST _0 ALT 18 16* 17  ALKPHOS 51 38 41  BILITOT 0.7 0.7 0.6    RADIOGRAPHIC STUDIES: I have personally reviewed the radiological images as listed and agreed with the findings in the report. Ct Head Wo Contrast  Result Date: 08/02/2017 CLINICAL DATA:  Altered level of consciousness that is unexplained. Speech difficulty. Generalized decline EXAM: CT HEAD WITHOUT CONTRAST TECHNIQUE: Contiguous axial images were obtained from the base of the skull through the vertex without intravenous contrast. COMPARISON:  Brain MRI 01/08/2012 FINDINGS: Brain: No evidence of acute infarction, hemorrhage, hydrocephalus, extra-axial collection or mass lesion/mass effect. Low cerebellar tonsils without significant mass effect. Generalized cerebral volume loss with mild progression since 2013. Vascular: No hyperdense vessel or unexpected calcification. Skull: No acute or aggressive finding. Sinuses/Orbits: No acute finding.  Bilateral cataract resection. IMPRESSION: Senescent changes without acute or reversible finding. Electronically Signed   By: Monte Fantasia M.D.   On: 08/02/2017 15:51   Dg Chest Port 1 View  Result Date: 08/02/2017 CLINICAL DATA:  Weakness.  Acute encephalopathy. EXAM: PORTABLE CHEST 1 VIEW COMPARISON:  01/08/2012 FINDINGS: Cardiomediastinal silhouette is normal. Mediastinal contours appear intact. Tortuosity of the aorta noted. There is no evidence of focal airspace consolidation, pleural effusion or pneumothorax. Osseous structures are without acute abnormality. Soft tissues are grossly normal. IMPRESSION: No active disease. Electronically Signed   By: Fidela Salisbury M.D.   On: 08/02/2017 11:38    ASSESSMENT & PLAN: 71 year old Caucasian male, with past medical history of chronic mild anemia and thrombocytopenia, last seen by my partner Dr.  Alen Blew in 03/2017, Parkinson disease, hypertension, CAD, history of TIA, hypothyroidism, presented to Hospital was black stool, ongoing weight loss, nausea and anorexia.   1. Worsening anemia and thrombocytopenia  2. Anorexia and weight loss 3. Parkinson disease 4. HTN 5. CAD 6. Deconditioning   Recommendations: -lab results reviewed, anemia is normocytic, hypo-productive with low ret, low iron, low TIBC, elevated ferritin, which supports anemia of  chronic disease, folic acid and G28 levels are normal/elevated  -DIC panel showed significant decreased fibrinogen, increased d-dimer, supports DIC. Patient has no clinical signs of infection, cultures are still pending. Malignancy related DIC needs to be ruled out, given his recent anorexia and weight loss.  -He does have chronic mild anemia and thrombocytopenia, however this has gotten much worse daily, he also developed mild leukopenia, this is concerning for primary bone marrow disease, such as MDS  -Given his significant weight loss, anorexia and nausea, malignancy needs to be ruled out. I agree with further imaging scan. His CT abdomen 1 year ago showed questionable soft tissue mass in the left renal pelvis and proximal left ureter. Abdominal MRI without contrast was ordered. I also recommend to get a CT chest without contrast -if scan is unremarkable, I recommend bone marrow biopsy next week  -his SPEP was negative in 11/2016, I do not have high suspicion for MM given no other signs of MM  -continue supportive care with blood transfusion to keep Hg above 8.0 -monitor CBC closely  -I will follow up      Truitt Merle, MD 08/03/2017 8:35 PM

## 2017-08-04 ENCOUNTER — Encounter (HOSPITAL_COMMUNITY): Payer: Self-pay | Admitting: *Deleted

## 2017-08-04 ENCOUNTER — Inpatient Hospital Stay (HOSPITAL_COMMUNITY): Payer: Medicare HMO

## 2017-08-04 DIAGNOSIS — R1310 Dysphagia, unspecified: Secondary | ICD-10-CM

## 2017-08-04 DIAGNOSIS — D696 Thrombocytopenia, unspecified: Secondary | ICD-10-CM

## 2017-08-04 LAB — CBC WITH DIFFERENTIAL/PLATELET
BASOS ABS: 0 10*3/uL (ref 0.0–0.1)
BASOS PCT: 0 %
Basophils Absolute: 0 10*3/uL (ref 0.0–0.1)
Basophils Relative: 0 %
EOS ABS: 0 10*3/uL (ref 0.0–0.7)
EOS ABS: 0 10*3/uL (ref 0.0–0.7)
Eosinophils Relative: 0 %
Eosinophils Relative: 0 %
HEMATOCRIT: 26.8 % — AB (ref 39.0–52.0)
HEMATOCRIT: 29.1 % — AB (ref 39.0–52.0)
HEMOGLOBIN: 9 g/dL — AB (ref 13.0–17.0)
HEMOGLOBIN: 9.6 g/dL — AB (ref 13.0–17.0)
Lymphocytes Relative: 26 %
Lymphocytes Relative: 19 %
Lymphs Abs: 0.8 10*3/uL (ref 0.7–4.0)
Lymphs Abs: 0.8 10*3/uL (ref 0.7–4.0)
MCH: 28.7 pg (ref 26.0–34.0)
MCH: 28.2 pg (ref 26.0–34.0)
MCHC: 33.6 g/dL (ref 30.0–36.0)
MCHC: 33 g/dL (ref 30.0–36.0)
MCV: 85.4 fL (ref 78.0–100.0)
MCV: 85.3 fL (ref 78.0–100.0)
MONOS PCT: 6 %
MONOS PCT: 8 %
Monocytes Absolute: 0.2 10*3/uL (ref 0.1–1.0)
Monocytes Absolute: 0.3 10*3/uL (ref 0.1–1.0)
NEUTROS ABS: 2.2 10*3/uL (ref 1.7–7.7)
NEUTROS PCT: 68 %
NEUTROS PCT: 73 %
Neutro Abs: 3.1 10*3/uL (ref 1.7–7.7)
Platelets: 65 10*3/uL — ABNORMAL LOW (ref 150–400)
Platelets: 81 10*3/uL — ABNORMAL LOW (ref 150–400)
RBC: 3.14 MIL/uL — AB (ref 4.22–5.81)
RBC: 3.41 MIL/uL — ABNORMAL LOW (ref 4.22–5.81)
RDW: 14.7 % (ref 11.5–15.5)
RDW: 14.4 % (ref 11.5–15.5)
WBC: 3.2 10*3/uL — ABNORMAL LOW (ref 4.0–10.5)
WBC: 4.3 10*3/uL (ref 4.0–10.5)

## 2017-08-04 LAB — COMPREHENSIVE METABOLIC PANEL
ALBUMIN: 3 g/dL — AB (ref 3.5–5.0)
ALK PHOS: 40 U/L (ref 38–126)
ALT: 18 U/L (ref 17–63)
ANION GAP: 11 (ref 5–15)
AST: 25 U/L (ref 15–41)
BILIRUBIN TOTAL: 1 mg/dL (ref 0.3–1.2)
BUN: 15 mg/dL (ref 6–20)
CALCIUM: 8.5 mg/dL — AB (ref 8.9–10.3)
CO2: 21 mmol/L — ABNORMAL LOW (ref 22–32)
CREATININE: 1.21 mg/dL (ref 0.61–1.24)
Chloride: 113 mmol/L — ABNORMAL HIGH (ref 101–111)
GFR calc Af Amer: >60 mL/min (ref >60)
GFR calc non Af Amer: 58 mL/min — ABNORMAL LOW (ref >60)
GLUCOSE: 74 mg/dL (ref 65–99)
Potassium: 3.7 mmol/L (ref 3.5–5.1)
SODIUM: 145 mmol/L (ref 135–145)
Total Protein: 5.1 g/dL — ABNORMAL LOW (ref 6.5–8.1)

## 2017-08-04 LAB — PROTIME-INR
INR: 1.47
Prothrombin Time: 17.7 seconds — ABNORMAL HIGH (ref 11.4–15.2)

## 2017-08-04 LAB — GLUCOSE, CAPILLARY
GLUCOSE-CAPILLARY: 110 mg/dL — AB (ref 65–99)
Glucose-Capillary: 61 mg/dL — ABNORMAL LOW (ref 65–99)
Glucose-Capillary: 91 mg/dL (ref 65–99)

## 2017-08-04 LAB — MAGNESIUM: Magnesium: 2 mg/dL (ref 1.7–2.4)

## 2017-08-04 LAB — PHOSPHORUS: PHOSPHORUS: 3.6 mg/dL (ref 2.5–4.6)

## 2017-08-04 MED ORDER — LEVOTHYROXINE SODIUM 100 MCG IV SOLR
56.0000 ug | Freq: Every day | INTRAVENOUS | Status: DC
  Filled 2017-08-04 (×2): qty 5

## 2017-08-04 MED ORDER — DEXTROSE 50 % IV SOLN
INTRAVENOUS | Status: AC
  Filled 2017-08-04: qty 50

## 2017-08-04 MED ORDER — POTASSIUM CL IN DEXTROSE 5% 20 MEQ/L IV SOLN
20.0000 meq | INTRAVENOUS | Status: DC
  Administered 2017-08-04 – 2017-08-06 (×3): 20 meq via INTRAVENOUS
  Filled 2017-08-04 (×5): qty 1000

## 2017-08-04 MED ORDER — DEXTROSE 50 % IV SOLN
25.0000 mL | Freq: Once | INTRAVENOUS | Status: AC
  Administered 2017-08-04: 25 mL via INTRAVENOUS

## 2017-08-04 NOTE — Evaluation (Signed)
Clinical/Bedside Swallow Evaluation Patient Details  Name: Kyle Espinoza MRN: [id number] Date of Birth: [birth year]  Today's Date: 08/04/2017 Time: SLP Start Time (ACUTE ONLY): 1030 SLP Stop Time (ACUTE ONLY): 1050 SLP Time Calculation (min) (ACUTE ONLY): 20 min  Past Medical History:  Past Medical History:  Diagnosis Date  . Anemia   . CKD (chronic kidney disease), stage II   . 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 History of adenomatous polyp of colon    hyperplastic  2013  . History of kidney stones   . History of transient ischemic attack (TIA)    01-07-2012  . Hyperlipidemia   . Hypertension   . Hypothyroidism   . Impairment of balance   . 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 OA (osteoarthritis)   . Pancytopenia ([place])   . Parkinsonism ([place])    UNSPECIFIED type --  NEUROLOGIST-- DR Star Age  . Splenomegaly   . Ureteral tumor    left upper tract  . Wears dentures    full upper and partial lower  . Wears glasses   . Wears hearing aid    bilateral hears ---  very hoh over phone   Past Surgical History:  Past Surgical History:  Procedure Laterality Date  . ANTERIOR CERVICAL DECOMP/DISCECTOMY FUSION  06/04/2008   C4 -- C7  . CATARACT EXTRACTION W/ INTRAOCULAR LENS  IMPLANT, BILATERAL  2016  . COLONOSCOPY  last one 07-26-2012  . CYSTOSCOPY WITH RETROGRADE PYELOGRAM, URETEROSCOPY AND STENT PLACEMENT Left 08/10/2016   Procedure: CYSTOSCOPY WITH LEFT  RETROGRADE PYELOGRAM, URETEROSCOPY URETERAL BIOPSY,  AND STENT PLACEMENT;  Surgeon: Ardis Hughs, MD;  Location: [facility];  Service: Urology;  Laterality: Left;  . CYSTOSCOPY/RETROGRADE/URETEROSCOPY/STONE EXTRACTION WITH BASKET  1987  . INGUINAL HERNIA REPAIR Right 1980's  . TRANSTHORACIC ECHOCARDIOGRAM  01/09/2012   mild focal basal LVH,  ef 55-60%,  grade 1 diastolic dysfunction/  mild AV stenosis , no regurg. , valve area 2.13cm/m^2 /  atrial septum lipomatous  hypertrophy/  mild TR   HPI:  Kyle Espinoza a 71 y.o.male with extensive medical history including CKD stage 2, left eye blindness, history of TIA in 2013, hyperlipidemia, hypertension, hypothyroidism, impairment of balance, osteoarthritis, history of parkinsonism followed by neurology, history of splenomegaly, 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 He was brought to the ED, after his wife found him tohave increased confusion, generalized weakness, and 1 episode of what melanotic stools. This follows the recent teeth extraction. She deniedthe patient having any increase shortness of breath, or cough. Has significant FTT with a 20 lb weight loss over the last 2 months. She reports that he has a new tremor on his lips, for the last week. He was admitted for confusion and dark melanotic stools and is currently being worked up. Gastroenterology consulted and patient underwent EGD revealing normal esophagus and stomach, non-bleeding duodenal diverticulum, no evidence of upper GI bleed. Non contrast abdominal MRI revealed no evidence of malignancy. CT head with no acute findings. CXR 8/30 showed no active disease. Per notes, pt coughing with liquids. Referred for bedside swallowing evaluation.    Assessment / Plan / Recommendation Clinical Impression  Patient presents with severe risk for aspiration including but not limited to Parkinsonism, cognitive impairment, poor secretion management. Pt has extremely hydrophonic voice, suggestive of aspiration of secretions. SLP performed oral care to remove bloody secretions, black coating  of tongue (due to pt's iron tablet, his wife reports). RN reports pt had difficulty swallowing medications this morning, pt coughing with sips of liquid. With single straw sip of thin liquid, pt displayed appearance of delayed, discoordinated swallow, immediate cough, increase in wet vocal quality, multiple swallows, and required oral suctioning, strongly  suggestive of reduced airway protection. Given pt's significant overt signs of aspiration and risk factors for dysphagia, recommend he remain NPO pending MBS to objectively evaluate swallow function. Educated pt and his wife on role of MBS to diagnose dysphagia and aid in determining safest, least restrictive diet. Pt and wife in agreement to proceed with study, will be performed later today. Additional recommendations to follow.  SLP Visit Diagnosis: Dysphagia, oropharyngeal phase (R13.12)    Aspiration Risk  Severe aspiration risk    Diet Recommendation NPO   Medication Administration: Via alternative means    Other  Recommendations Oral Care Recommendations: Oral care QID Other Recommendations: Remove water pitcher;Have oral suction available   Follow up Recommendations        Frequency and Duration            Prognosis Prognosis for Safe Diet Advancement: Fair Barriers to Reach Goals: Severity of deficits;Other (Comment) (comorbidities)      Swallow Study   General Date of Onset: 08/02/17 HPI: Kyle Espinoza a 71 y.o.male with extensive medical history including CKD stage 2, left eye blindness, history of TIA in 2013, hyperlipidemia, hypertension, hypothyroidism, impairment of balance, osteoarthritis, history of parkinsonism followed by neurology, history of splenomegaly, 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 He was brought to the ED, after his wife found him tohave increased confusion, generalized weakness, and 1 episode of what melanotic stools. This follows the recent teeth extraction. She deniedthe patient having any increase shortness of breath, or cough. Has significant FTT with a 20 lb weight loss over the last 2 months. She reports that he has a new tremor on his lips, for the last week. He was admitted for confusion and dark melanotic stools and is currently being worked up. Gastroenterology consulted and patient underwent EGD revealing normal  esophagus and stomach, non-bleeding duodenal diverticulum, no evidence of upper GI bleed. Non contrast abdominal MRI revealed no evidence of malignancy. CT head with no acute findings. CXR 8/30 showed no active disease. Per notes, pt coughing with liquids. Referred for bedside swallowing evaluation.  Type of Study: Bedside Swallow Evaluation Previous Swallow Assessment: none in chart Diet Prior to this Study: Thin liquids (full liquids) Temperature Spikes Noted: No Respiratory Status: Room air History of Recent Intubation: No Behavior/Cognition: Alert;Cooperative Oral Cavity Assessment: Excessive secretions;Dried secretions (bloody secretions s/p dental extraction) Oral Care Completed by SLP: Yes Oral Cavity - Dentition: Dentures, top;Missing dentition;Poor condition Vision: Functional for [place]-feeding [place]-Feeding Abilities: Able to feed [place] Patient Positioning: Upright in chair Baseline Vocal Quality: Wet;Low vocal intensity Volitional Cough: Weak Volitional Swallow: Able to elicit    Oral/Motor/Sensory Function Overall Oral Motor/Sensory Function: Generalized oral weakness   Ice Chips Ice chips: Not tested   Thin Liquid Thin Liquid: Impaired Presentation: Straw Oral Phase Impairments: Reduced lingual movement/coordination;Reduced labial seal Oral Phase Functional Implications: Oral holding;Prolonged oral transit Pharyngeal  Phase Impairments: Wet Vocal Quality;Multiple swallows;Cough - Immediate    Nectar Thick Nectar Thick Liquid: Not tested   Honey Thick Honey Thick Liquid: Not tested   Puree Puree: Not tested   Solid   GO   [name], MS, CCC-SLP Speech-Language Pathologist [phone] Solid:  Not tested        [name] 08/04/2017,11:08 AM

## 2017-08-04 NOTE — Progress Notes (Addendum)
OT Cancellation Note  Patient Details Name: Kyle Espinoza MRN: [id number] DOB: 1946-06-01   Cancelled Treatment:    Reason Eval/Treat Not Completed: Patient at procedure or test/ unavailable. Pt off unit for MRI on OT arrival. Will check back as able to initiate evaluation.  11:25 AM Pt preparing to leave with transport team for Modified Barium Swallow test on second attempt for OT evaluation this date. Will check back as able.  Norman Herrlich, MS OTR/L  Pager: [place] A Piya Mesch 08/04/2017, 8:21 AM

## 2017-08-04 NOTE — Progress Notes (Signed)
SLP Cancellation Note  Patient Details Name: Kyle Espinoza MRN: [id number] DOB: December 15, 1945   Cancelled treatment:       Reason Eval/Treat Not Completed: Patient at procedure or test/unavailable. Attempt for swallow/cognitive eval. Pt out of room for MRI, will f/u later today.  [name], [place], [place] Speech-Language Pathologist [phone]  [name] 08/04/2017, 8:15 AM

## 2017-08-04 NOTE — Evaluation (Signed)
Occupational Therapy Evaluation Patient Details Name: Kyle Espinoza MRN: [id number] DOB: [birth year] Today's Date: 08/04/2017    History of Present Illness Pt is a 71 y/o male admitted secondary to confusion and dark stools, found to have a GI bleed. 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   Clinical Impression   PTA, pt and wife report independence with intermittent cane use for basic ADL tasks and functional mobility. He currently requires max assist for LB ADL, min assist for UB ADL, and mod assist for short distance ambulating toilet transfers. Pt presents with decreased problem solving skills and decreased short-term memory (may be close to baseline), generalized weakness, and poor sequencing skills impacting his ability to participate in ADL at [facility]. At current functional level, recommend SNF level rehabilitation post-acute D/C in order to maximize independence and safety with ADL prior to returning home with his wife. Will continue to follow while admitted.    Follow Up Recommendations  SNF;Supervision/Assistance - 24 hour    Equipment Recommendations  Other (comment) (TBD at next venue of care)    Recommendations for Other Services       Precautions / Restrictions Precautions Precautions: Fall Restrictions Weight Bearing Restrictions: No      Mobility Bed Mobility Overal bed mobility: Needs Assistance Bed Mobility: Supine to Sit     Supine to sit: Mod assist     General bed mobility comments: Mod assist to manage B LE and raise trunk from bed.   Transfers Overall transfer level: Needs assistance Equipment used: Rolling walker (2 wheeled) Transfers: Sit to/from Stand Sit to Stand: Min assist         General transfer comment: Pt highly unsteady with difficulty maneuvering RW.    Balance Overall balance assessment: Needs assistance Sitting-balance support: Feet supported;No upper extremity  supported Sitting balance-Leahy Scale: Fair     Standing balance support: Bilateral upper extremity supported;During functional activity Standing balance-Leahy Scale: Poor                             ADL either performed or assessed with clinical judgement   ADL Overall ADL's : Needs assistance/impaired Eating/Feeding: NPO   Grooming: Minimal assistance;Sitting   Upper Body Bathing: Minimal assistance;Sitting   Lower Body Bathing: Maximal assistance;Sit to/from stand   Upper Body Dressing : Minimal assistance;Sitting   Lower Body Dressing: Maximal assistance;Sit to/from stand   Toilet Transfer: Moderate assistance;Ambulation;RW Toilet Transfer Details (indicate cue type and reason): Simulated from bed to chair with a few steps.  Toileting- Clothing Manipulation and Hygiene: Moderate assistance;Sit to/from stand       Functional mobility during ADLs: Moderate assistance;Rolling walker General ADL Comments: Pt highly unstable and reports he feels "wobbly." Additionally demonstrating poor ability to sequence ambulation tasks.      Vision Baseline Vision/History: Wears glasses Wears Glasses: Reading only Vision Assessment?: No apparent visual deficits     Perception     Praxis      Pertinent Vitals/Pain Pain Assessment: No/denies pain     Hand Dominance     Extremity/Trunk Assessment Upper Extremity Assessment Upper Extremity Assessment: Generalized weakness   Lower Extremity Assessment Lower Extremity Assessment: Generalized weakness   Cervical / Trunk Assessment Cervical / Trunk Assessment: Kyphotic   Communication Communication Communication: HOH   Cognition Arousal/Alertness: Awake/alert Behavior During Therapy: WFL for tasks assessed/performed Overall Cognitive Status: Difficult to assess Area  of Impairment: Orientation;Memory;Safety/judgement;Problem solving;Following commands                 Orientation Level: Disoriented  to;Time;Situation   Memory: Decreased short-term memory Following Commands: Follows one step commands inconsistently Safety/Judgement: Decreased awareness of safety   Problem Solving: Decreased initiation;Slow processing General Comments: Pt with decreased short-term memroy and requiring increased processing time and demonstrating poor sequencing at times. History of Parkinson's   General Comments       Exercises     Shoulder Instructions      Home Living Family/patient expects to be discharged to:: Private residence Living Arrangements: Spouse/significant other Available Help at Discharge: Family Type of Home: House Home Access: Stairs to enter Technical brewer of Steps: 2 Entrance Stairs-Rails: Right;Left;Can reach both Home Layout: One level     Bathroom Shower/Tub: Occupational psychologist: Handicapped height     Home Equipment: Grab bars - tub/shower      Lives With: Spouse    Prior Functioning/Environment Level of Independence: Independent with assistive device(s)        Comments: per wife - pt ambulates with SPC at all times and is independent with ADLs        OT Problem List: Decreased strength;Impaired balance (sitting and/or standing);Decreased activity tolerance;Decreased coordination;Decreased safety awareness;Decreased knowledge of precautions;Decreased cognition      OT Treatment/Interventions: [place]-care/ADL training;Therapeutic exercise;Modalities;Therapeutic activities;Patient/family education;Balance training;DME and/or AE instruction    OT Goals(Current goals can be found in the care plan section) Acute Rehab OT Goals Patient Stated Goal: none stated OT Goal Formulation: With patient/family Time For Goal Achievement: 08/18/17 Potential to Achieve Goals: Good  OT Frequency: Min 2X/week   Barriers to D/C:            Co-evaluation              AM-PAC PT "6 Clicks" Daily Activity     Outcome Measure Help from another  person eating meals?: A Little Help from another person taking care of personal grooming?: A Lot Help from another person toileting, which includes using toliet, bedpan, or urinal?: A Lot Help from another person bathing (including washing, rinsing, drying)?: A Lot Help from another person to put on and taking off regular upper body clothing?: A Little Help from another person to put on and taking off regular lower body clothing?: A Lot 6 Click Score: 14   End of Session Equipment Utilized During Treatment: Rolling walker;Gait belt Nurse Communication: Mobility status  Activity Tolerance: Patient tolerated treatment well Patient left: in chair;with call bell/phone within reach  OT Visit Diagnosis: Unsteadiness on feet (R26.81);Muscle weakness (generalized) (M62.81);Other symptoms and signs involving cognitive function                Time: 1411-1439 OT Time Calculation (min): 28 min Charges:  OT General Charges $OT Visit: 1 Visit OT Evaluation $OT Eval Moderate Complexity: 1 Mod OT Treatments $[place] Care/Home Management : 8-22 mins G-Codes:     Norman Herrlich, MS OTR/L  Pager: [place] A Atianna Haidar 08/04/2017, 5:15 PM

## 2017-08-04 NOTE — Evaluation (Signed)
Physical Therapy Evaluation Patient Details Name: Kyle Espinoza MRN: [id number] DOB: 01/12/1946 Today's Date: 08/04/2017   History of Present Illness  Pt is a 71 y/o male admitted secondary to confusion and dark stools, found to have a GI bleed. 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  Clinical Impression  Pt presented supine in bed with HOB elevated, awake and willing to participate in therapy session. Per wife - prior to admission pt was ambulating with use of SPC and independent with ADLs. Pt currently requires min A for bed mobility, min A for transfers and min-mod A of two to ambulate a very short distance. Session limited as transport arrived to take pt for a swallow study. Pt would continue to benefit from skilled physical therapy services at this time while admitted and after d/c to address the below listed limitations in order to improve overall safety and independence with functional mobility.     Follow Up Recommendations SNF    Equipment Recommendations  None recommended by PT    Recommendations for Other Services       Precautions / Restrictions Precautions Precautions: Fall Restrictions Weight Bearing Restrictions: No      Mobility  Bed Mobility Overal bed mobility: Needs Assistance Bed Mobility: Sit to Supine       Sit to supine: Min assist   General bed mobility comments: increased time and effort, min A to return bilateral LEs onto bed  Transfers Overall transfer level: Needs assistance Equipment used: 1 person hand held assist Transfers: Sit to/from Stand Sit to Stand: Min assist         General transfer comment: very unsteady with transition into standing from sitting in recliner chair  Ambulation/Gait Ambulation/Gait assistance: Min assist;Mod assist;+2 safety/equipment;+2 physical assistance Ambulation Distance (Feet): 3 Feet Assistive device: 1 person hand held  assist Gait Pattern/deviations: Step-to pattern;Step-through pattern;Decreased step length - right;Decreased step length - left;Decreased stride length;Shuffle;Trunk flexed Gait velocity: decreased Gait velocity interpretation: Below normal speed for age/gender General Gait Details: very unsteady with ambulation without use of an AD, min-mod A of two for safety   Stairs            Wheelchair Mobility    Modified Rankin (Stroke Patients Only)       Balance Overall balance assessment: Needs assistance Sitting-balance support: Feet supported Sitting balance-Leahy Scale: Poor     Standing balance support: During functional activity;Single extremity supported Standing balance-Leahy Scale: Poor                               Pertinent Vitals/Pain Pain Assessment: Faces Faces Pain Scale: No hurt    Home Living Family/patient expects to be discharged to:: Private residence Living Arrangements: Spouse/significant other Available Help at Discharge: Family Type of Home: House Home Access: Stairs to enter Entrance Stairs-Rails: Right;Left;Can reach both Technical brewer of Steps: 2 Home Layout: One level Home Equipment: Cane - single point      Prior Function Level of Independence: Independent with assistive device(s)         Comments: per wife - pt ambulates with SPC at all times and is independent with ADLs     Hand Dominance        Extremity/Trunk Assessment   Upper Extremity Assessment Upper Extremity Assessment: Defer to OT evaluation    Lower Extremity Assessment Lower Extremity Assessment: Generalized weakness  Cervical / Trunk Assessment Cervical / Trunk Assessment: Kyphotic  Communication   Communication: HOH  Cognition Arousal/Alertness: Awake/alert Behavior During Therapy: Flat affect Overall Cognitive Status: Difficult to assess                                 General Comments: difficult to assess as pt's  wife answering all/most questions and pt very HOH      General Comments      Exercises     Assessment/Plan    PT Assessment Patient needs continued PT services  PT Problem List Decreased strength;Decreased balance;Decreased mobility;Decreased coordination;Decreased knowledge of use of DME;Decreased safety awareness       PT Treatment Interventions DME instruction;Gait training;Stair training;Functional mobility training;Therapeutic exercise;Balance training;Therapeutic activities;Neuromuscular re-education;Patient/family education    PT Goals (Current goals can be found in the Care Plan section)  Acute Rehab PT Goals Patient Stated Goal: none stated PT Goal Formulation: With patient/family Time For Goal Achievement: 08/18/17 Potential to Achieve Goals: Fair    Frequency Min 3X/week   Barriers to discharge        Co-evaluation               AM-PAC PT "6 Clicks" Daily Activity  Outcome Measure Difficulty turning over in bed (including adjusting bedclothes, sheets and blankets)?: A Lot Difficulty moving from lying on back to sitting on the side of the bed? : Unable Difficulty sitting down on and standing up from a chair with arms (e.g., wheelchair, bedside commode, etc,.)?: Unable Help needed moving to and from a bed to chair (including a wheelchair)?: A Lot Help needed walking in hospital room?: A Lot Help needed climbing 3-5 steps with a railing? : A Lot 6 Click Score: 10    End of Session Equipment Utilized During Treatment: Gait belt Activity Tolerance: Patient tolerated treatment well Patient left: in bed;with call bell/phone within reach;Other (comment) (with transport to go for swallow study) Nurse Communication: Mobility status PT Visit Diagnosis: Other abnormalities of gait and mobility (R26.89);Unsteadiness on feet (R26.81)    Time: 1115-1130 PT Time Calculation (min) (ACUTE ONLY): 15 min   Charges:   PT Evaluation $PT Eval Moderate Complexity: 1  Mod     PT G Codes:        [place], PT, DPT [place] 08/04/2017, 11:41 AM

## 2017-08-04 NOTE — Progress Notes (Addendum)
Patient's blood sugar 61. RN administered 59ml d50 per MAR. Will hang ordered IVF when bag arrives from pharmacy. Will recheck cbg   CBG recheck 110

## 2017-08-04 NOTE — Progress Notes (Signed)
Paged [facility]. Informed him Patient choking on sips. Has been NPO all night. Suctioned. Will continue to monitor.

## 2017-08-04 NOTE — Progress Notes (Signed)
Modified Barium Swallow Progress Note  Patient Details  Name: Kyle Espinoza MRN: [id number] Date of Birth: 12/27/1945  Today's Date: 08/04/2017  Modified Barium Swallow completed.  Full report located under Chart Review in the Imaging Section.  Brief recommendations include the following:  Clinical Impression  Patient presents with moderate oral and severe pharyngoesophageal dysphagia and severe risk for aspiration. Pt's oral phase characterized by lingual weakness, sluggish oral transit. Swallow initiation is relatively swift once bolus reaches the base of tongue, however pt's pharygneal swallow is impacted by what appears to be a region of prominent tissue in the supraglottic region that appears to protrude into the pharynx, impeding hyolaryngeal excursion and passage of boluses through the pharynx. Palpation of this region notable for edema. As a result, severe residue with puree (100% of the bolus) is retained in the valleculae. Attempted postural manuevers including chin tuck, head turn, liquid wash which were not effective in facilitating bolus clearance. Thin liquid wash mixed with vallecular residue which then spilled into the pyriform sinuses and upper laryngeal vestibule. Approx 10% of the bolus passed through the UES, however severe residue remained above the UES, along the posterior pharynx, pyriforms and in the valleculae. Patient is at severe risk for aspiration of residue with all consistencies. Recommend NPO, pt may benefit from cortrak for alternative means of nutrition/hydration. Recommend ENT consult; pt may also benefit from palliative consult given pt's comorbidities which may impact his ability to safely resume oral feeding. MD paged. Will follow for further education, goals of care.    Swallow Evaluation Recommendations   Recommended Consults: Consider ENT evaluation;Other (Comment) (palliative consult)   SLP Diet Recommendations: NPO                       Oral Care  Recommendations: Oral care QID   Other Recommendations: Remove water pitcher;Have oral suction available  [name], [place], CCC-SLP Speech-Language Pathologist [phone]  [name] 08/04/2017,1:11 PM

## 2017-08-04 NOTE — Progress Notes (Signed)
Kyle Espinoza   DOB:[birth year]   YS#:[id number]   XJD#:[id number]  Hematology and oncology follow-up  Subjective: Patient's hemoglobin and platelet has been stable, slightly better overall. No clinical overt bleeding. He underwent speech evaluation today, he was put on nothing by mouth due to his high risk of aspiration. I met his wife at bedside today.    Objective:  Vitals:   08/04/17 1415 08/04/17 1700  BP: 139/67 (!) 133/54  Pulse: 88 74  Resp: 20 16  Temp: 97.9 F (36.6 C) 97.8 F (36.6 C)  SpO2: 100% 100%    Body mass index is 16.17 kg/m.  Intake/Output Summary (Last 24 hours) at 08/04/17 1848 Last data filed at 08/03/17 2100  Gross per 24 hour  Intake                0 ml  Output              200 ml  Net             -200 ml     Sclerae unicteric  Oropharynx clear  No peripheral adenopathy  Lungs clear -- no rales or rhonchi  Heart regular rate and rhythm  Abdomen benign  MSK no focal spinal tenderness, no peripheral edema  Neuro nonfocal    CBG (last 3)   Recent Labs  08/04/17 1626 08/04/17 1715  GLUCAP 61* 110*     Labs:  Lab Results  Component Value Date   WBC 4.3 08/04/2017   HGB 9.6 (L) 08/04/2017   HCT 29.1 (L) 08/04/2017   MCV 85.3 08/04/2017   PLT 81 (L) 08/04/2017   NEUTROABS 3.1 08/04/2017   CMP Latest Ref Rng & Units 08/04/2017 08/03/2017 08/03/2017  Glucose 65 - 99 mg/dL 74 90 99  BUN 6 - 20 mg/dL 15 21(H) 29(H)  Creatinine 0.61 - 1.24 mg/dL 1.21 1.11 1.24  Sodium 135 - 145 mmol/L 145 143 140  Potassium 3.5 - 5.1 mmol/L 3.7 3.7 3.5  Chloride 101 - 111 mmol/L 113(H) 111 111  CO2 22 - 32 mmol/L 21(L) 21(L) 21(L)  Calcium 8.9 - 10.3 mg/dL 8.5(L) 8.4(L) 8.2(L)  Total Protein 6.5 - 8.1 g/dL 5.1(L) 5.3(L) 4.8(L)  Total Bilirubin 0.3 - 1.2 mg/dL 1.0 0.6 0.7  Alkaline Phos 38 - 126 U/L 40 41 38  AST 15 - 41 U/L _0 ALT 17 - 63 U/L 18 17 16(L)     Urine Studies No results for input(s): UHGB, CRYS in the last 72 hours.  Invalid  input(s): UACOL, UAPR, USPG, UPH, UTP, UGL, UKET, UBIL, UNIT, UROB, [place], UEPI, UWBC, Duwayne Heck [place], [place]  Basic Metabolic Panel:  Recent Labs Lab 08/02/17 0850 08/03/17 0013 08/03/17 0939 08/04/17 0630  NA 141 140 143 145  K 3.8 3.5 3.7 3.7  CL 108 111 111 113*  CO2 22 21* 21* 21*  GLUCOSE 133* 99 90 74  BUN 43* 29* 21* 15  CREATININE 1.60* 1.24 1.11 1.21  CALCIUM 9.2 8.2* 8.4* 8.5*  MG  --   --  2.1 2.0  PHOS  --   --  3.1 3.6   GFR Estimated Creatinine Clearance: 39.9 mL/min (by C-G formula based on SCr of 1.21 mg/dL). Liver Function Tests:  Recent Labs Lab 08/02/17 0850 08/03/17 0013 08/03/17 0939 08/04/17 0630  AST _1 ALT 18 16* 17 18  ALKPHOS 51 38 41 40  BILITOT 0.7 0.7 0.6 1.0  PROT 6.1* 4.8* 5.3*  5.1*  ALBUMIN 3.9 3.0* 3.2* 3.0*   No results for input(s): LIPASE, AMYLASE in the last 168 hours.  Recent Labs Lab 08/02/17 1154  AMMONIA 10   Coagulation profile  Recent Labs Lab 08/02/17 1154 08/03/17 0013 08/04/17 0630  INR 1.32  1.27 1.39 1.47    CBC:  Recent Labs Lab 08/03/17 0013 08/03/17 0939 08/03/17 2032 08/04/17 0630 08/04/17 1439  WBC 2.3* 2.4* 3.9* 3.2* 4.3  NEUTROABS  --  1.6* 2.8 2.2 3.1  HGB 6.2* 8.5* 8.9* 9.0* 9.6*  HCT 18.8* 26.2* 26.9* 26.8* 29.1*  MCV 86.2 87.0 85.9 85.4 85.3  PLT 54* 60* 69* 65* 81*   Cardiac Enzymes:  Recent Labs Lab 08/02/17 1154  TROPONINI <0.03   BNP: Invalid input(s): POCBNP CBG:  Recent Labs Lab 08/04/17 1626 08/04/17 1715  GLUCAP 61* 110*   D-Dimer  Recent Labs  08/02/17 1154  DDIMER >20.00*   Hgb A1c No results for input(s): HGBA1C in the last 72 hours. Lipid Profile No results for input(s): CHOL, HDL, LDLCALC, TRIG, CHOLHDL, LDLDIRECT in the last 72 hours. Thyroid function studies  Recent Labs  08/02/17 1154  TSH 1.351   Anemia work up  Recent Labs  08/02/17 1154  VITAMINB12 3,381*  FOLATE 21.9  FERRITIN 735*  TIBC 190*  IRON 29*   RETICCTPCT 1.2   Microbiology Recent Results (from the past 240 hour(s))  Culture, blood (Routine X 2) w Reflex to ID Panel     Status: None (Preliminary result)   Collection Time: 08/02/17  5:08 PM  Result Value Ref Range Status   Specimen Description BLOOD RIGHT ARM  Final   Special Requests IN PEDIATRIC BOTTLE Blood Culture adequate volume  Final   Culture NO GROWTH 2 DAYS  Final   Report Status PENDING  Incomplete  Culture, blood (Routine X 2) w Reflex to ID Panel     Status: None (Preliminary result)   Collection Time: 08/02/17  5:27 PM  Result Value Ref Range Status   Specimen Description BLOOD RIGHT ARM  Final   Special Requests IN PEDIATRIC BOTTLE Blood Culture adequate volume  Final   Culture NO GROWTH 2 DAYS  Final   Report Status PENDING  Incomplete      Studies:  Ct Chest Wo Contrast  Result Date: 08/04/2017 CLINICAL DATA:  Shortness of breath EXAM: CT CHEST WITHOUT CONTRAST TECHNIQUE: Multidetector CT imaging of the chest was performed following the standard protocol without IV contrast. COMPARISON:  01/10/2008 FINDINGS: Cardiovascular: Normal heart size. Scattered coronary calcifications. No pericardial effusion. Patchy calcifications in the aortic arch in descending thoracic segment without aneurysm. Mediastinum/Nodes: No enlarged mediastinal or axillary lymph nodes. Thyroid gland, trachea, and esophagus demonstrate no significant findings. Residual contrast in the mid esophagus from recent swallow study. Lungs/Pleura: Scattered clustered ground-glass opacities in the superior segment left upper lobe, and in the medial basal segment right lower lobe, up to 8 mm. Linear scarring or subsegmental atelectasis posteriorly in the left lower lobe. No pneumothorax. No effusion. Upper Abdomen: No acute findings. Residual contrast in the gastric lumen from recent swallowing study. Musculoskeletal: Cervical fixation hardware partially visualized. Anterior vertebral endplate spurring at  multiple levels in the mid thoracic spine. Negative for fracture or worrisome bone lesion. IMPRESSION: 1. Scattered cluster ground-glass opacities in both lower lobes, likely infectious/inflammatory. Non-contrast chest CT at 3-6 months is recommended. If nodules persist, subsequent management will be based upon the most suspicious nodule(s). This recommendation follows the consensus statement: Guidelines for Management of  Incidental Pulmonary Nodules Detected on CT Images: From the Fleischner Society 2017; Radiology 2017; 284:228-243. 2. Coronary and Aortic Atherosclerosis (ICD10-170.0) Electronically Signed   By: Lucrezia Europe M.D.   On: 08/04/2017 13:56   Mr Abdomen Wo Contrast  Result Date: 08/04/2017 CLINICAL DATA:  Weight loss.  Unintended weight loss.  Pancytopenia. EXAM: MRI ABDOMEN WITHOUT CONTRAST TECHNIQUE: Multiplanar multisequence MR imaging was performed without the administration of intravenous contrast. COMPARISON:  CT 07/10/2016, retrograde pyelogram 08/10/2016 FINDINGS: Lower chest:  Lung bases are clear. Hepatobiliary: No focal hepatic lesion. No duct dilatation. Several large gallstones within lumen gallbladder. No gallbladder stones or inflammation. Common bile duct normal caliber. Some motion degradation of the exam. Pancreas: Motion degradation limits evaluation the pancreas. No pancreatic lesion identified. No pancreatic duct dilatation. Spleen: Normal volume spleen measures spleen measures 12.0 x 10.2 x 5.1 cm (volume = 330 cm^3). Adrenals/urinary tract: Adrenal glands are normal. No renal abnormality identified. No abnormality collecting system of the LEFT kidney. No IV contrast was administered. Stomach/Bowel: Stomach and limited of the small bowel is unremarkable Vascular/Lymphatic: Abdominal aortic normal caliber. No retroperitoneal periportal lymphadenopathy. Musculoskeletal: No aggressive osseous lesion IMPRESSION: 1. No evidence of malignancy non contrast abdominal MRI. 2. Exam is  limited by lack of IV contrast and patient motion. 3. No abnormality of the LEFT kidney. Of note patient has what appears to be normal retrograde pyelogram on 08/10/2016. Recommend correlation with a [facility] notes. 4. Cholelithiasis without cholecystitis. Electronically Signed   By: Suzy Bouchard M.D.   On: 08/04/2017 09:31   Dg Swallowing Func-speech Pathology  Result Date: 08/04/2017 Objective Swallowing Evaluation: Type of Study: MBS-Modified Barium Swallow Study Patient Details Name: Kyle Espinoza MRN: [id number] Date of Birth: 29-Feb-1946 Today's Date: 08/04/2017 Time: SLP Start Time (ACUTE ONLY): 1200-SLP Stop Time (ACUTE ONLY): 1220 SLP Time Calculation (min) (ACUTE ONLY): 20 min Past Medical History: Past Medical History: Diagnosis Date . Anemia  . CKD (chronic kidney disease), stage II  . 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 History of adenomatous polyp of colon   hyperplastic  2013 . History of kidney stones  . History of transient ischemic attack (TIA)   01-07-2012 . Hyperlipidemia  . Hypertension  . Hypothyroidism  . Impairment of balance  . Legally blind in left eye, as defined in [place]   CONGENITAL . OA (osteoarthritis)  . Pancytopenia ([place])  . Parkinsonism ([place])   UNSPECIFIED type --  NEUROLOGIST-- DR Star Age . Splenomegaly  . Ureteral tumor   left upper tract . Wears dentures   full upper and partial lower . Wears glasses  . Wears hearing aid   bilateral hears ---  very hoh over phone Past Surgical History: Past Surgical History: Procedure Laterality Date . ANTERIOR CERVICAL DECOMP/DISCECTOMY FUSION  06/04/2008  C4 -- C7 . CATARACT EXTRACTION W/ INTRAOCULAR LENS  IMPLANT, BILATERAL  2016 . COLONOSCOPY  last one 07-26-2012 . CYSTOSCOPY WITH RETROGRADE PYELOGRAM, URETEROSCOPY AND STENT PLACEMENT Left 08/10/2016  Procedure: CYSTOSCOPY WITH LEFT  RETROGRADE PYELOGRAM, URETEROSCOPY URETERAL BIOPSY,  AND STENT PLACEMENT;  Surgeon: Ardis Hughs, MD;  Location: [facility];  Service: Urology;  Laterality: Left; . CYSTOSCOPY/RETROGRADE/URETEROSCOPY/STONE EXTRACTION WITH BASKET  1987 . INGUINAL HERNIA REPAIR Right 1980's . TRANSTHORACIC ECHOCARDIOGRAM  01/09/2012  mild focal basal LVH,  ef 55-60%,  grade 1 diastolic dysfunction/  mild AV stenosis , no regurg. , valve area 2.13cm/m^2 /  atrial septum lipomatous hypertrophy/  mild TR HPI:  Kyle Porter Selfis a 71 y.o.male with extensive medical history including CKD stage 2, left eye blindness, history of TIA in 2013, hyperlipidemia, hypertension, hypothyroidism, impairment of balance, osteoarthritis, history of parkinsonism followed by neurology, history of splenomegaly, 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 He was brought to the ED, after his wife found him tohave increased confusion, generalized weakness, and 1 episode of what melanotic stools. This follows the recent teeth extraction. She deniedthe patient having any increase shortness of breath, or cough. Has significant FTT with a 20 lb weight loss over the last 2 months. She reports that he has a new tremor on his lips, for the last week. He was admitted for confusion and dark melanotic stools and is currently being worked up. Gastroenterology consulted and patient underwent EGD revealing normal esophagus and stomach, non-bleeding duodenal diverticulum, no evidence of upper GI bleed. Non contrast abdominal MRI revealed no evidence of malignancy. CT head with no acute findings. CXR 8/30 showed no active disease. Per notes, pt coughing with liquids. Referred for bedside swallowing evaluation.  Subjective: upright in chair, wife present Assessment / Plan / Recommendation CHL IP CLINICAL IMPRESSIONS 08/04/2017 Clinical Impression Patient presents with moderate oral and severe pharyngoesophageal dysphagia and severe risk for aspiration. Pt's oral phase characterized by lingual weakness, sluggish oral transit. Swallow initiation is relatively swift once  bolus reaches the base of tongue, however pt's pharygneal swallow is impacted by what appears to be a region of prominent tissue in the supraglottic region that appears to protrude into the pharynx, impeding hyolaryngeal excursion and passage of boluses through the pharynx. Palpation of this region notable for edema. As a result, severe residue with puree (100% of the bolus) is retained in the valleculae. Attempted postural manuevers including chin tuck, head turn, liquid wash which were not effective in facilitating bolus clearance. Thin liquid wash mixed with vallecular residue which then spilled into the pyriform sinuses and upper laryngeal vestibule. Approx 10% of the bolus passed through the UES, however severe residue remained above the UES, along the posterior pharynx, pyriforms and in the valleculae. Patient is at severe risk for aspiration of residue with all consistencies. Recommend NPO, pt may benefit from cortrak for alternative means of nutrition/hydration. Recommend ENT consult; pt may also benefit from palliative consult given pt's comorbidities which may impact his ability to safely resume oral feeding. MD paged. Will follow for further education, goals of care.  SLP Visit Diagnosis Dysphagia, pharyngoesophageal phase (R13.14);Dysphagia, oropharyngeal phase (R13.12) Attention and concentration deficit following -- Frontal lobe and executive function deficit following -- Impact on safety and function Severe aspiration risk;Risk for inadequate nutrition/hydration   CHL IP TREATMENT RECOMMENDATION 08/04/2017 Treatment Recommendations Therapy as outlined in treatment plan below   Prognosis 08/04/2017 Prognosis for Safe Diet Advancement Guarded Barriers to Reach Goals Severity of deficits;Other (Comment) Barriers/Prognosis Comment -- CHL IP DIET RECOMMENDATION 08/04/2017 SLP Diet Recommendations NPO Liquid Administration via -- Medication Administration -- Compensations -- Postural Changes --   CHL IP OTHER  RECOMMENDATIONS 08/04/2017 Recommended Consults Consider ENT evaluation;Other (Comment) Oral Care Recommendations Oral care QID Other Recommendations Remove water pitcher;Have oral suction available   CHL IP FOLLOW UP RECOMMENDATIONS 08/04/2017 Follow up Recommendations Skilled Nursing facility   [facility] IP FREQUENCY AND DURATION 08/04/2017 Speech Therapy Frequency (ACUTE ONLY) min 2x/week Treatment Duration 2 weeks      CHL IP ORAL PHASE 08/04/2017 Oral Phase Impaired Oral - Pudding Teaspoon -- Oral - Pudding Cup -- Oral - Honey Teaspoon --  Oral - Honey Cup -- Oral - Nectar Teaspoon -- Oral - Nectar Cup -- Oral - Nectar Straw -- Oral - Thin Teaspoon -- Oral - Thin Cup Weak lingual manipulation;Lingual pumping;Holding of bolus;Delayed oral transit Oral - Thin Straw -- Oral - Puree Weak lingual manipulation;Lingual pumping;Reduced posterior propulsion;Delayed oral transit Oral - Mech Soft -- Oral - Regular -- Oral - Multi-Consistency -- Oral - Pill -- Oral Phase - Comment --  CHL IP PHARYNGEAL PHASE 08/04/2017 Pharyngeal Phase Impaired Pharyngeal- Pudding Teaspoon -- Pharyngeal -- Pharyngeal- Pudding Cup -- Pharyngeal -- Pharyngeal- Honey Teaspoon -- Pharyngeal -- Pharyngeal- Honey Cup -- Pharyngeal -- Pharyngeal- Nectar Teaspoon -- Pharyngeal -- Pharyngeal- Nectar Cup -- Pharyngeal -- Pharyngeal- Nectar Straw -- Pharyngeal -- Pharyngeal- Thin Teaspoon -- Pharyngeal -- Pharyngeal- Thin Cup Reduced pharyngeal peristalsis;Reduced epiglottic inversion;Reduced anterior laryngeal mobility;Reduced laryngeal elevation;Reduced airway/laryngeal closure;Penetration/Apiration after swallow;Pharyngeal residue - valleculae;Pharyngeal residue - pyriform;Pharyngeal residue - posterior pharnyx;Pharyngeal residue - cp segment Pharyngeal Material enters airway, remains ABOVE vocal cords and not ejected out Pharyngeal- Thin Straw -- Pharyngeal -- Pharyngeal- Puree Reduced epiglottic inversion;Reduced anterior laryngeal mobility;Reduced laryngeal  elevation;Reduced airway/laryngeal closure;Pharyngeal residue - valleculae Pharyngeal -- Pharyngeal- Mechanical Soft -- Pharyngeal -- Pharyngeal- Regular -- Pharyngeal -- Pharyngeal- Multi-consistency -- Pharyngeal -- Pharyngeal- Pill -- Pharyngeal -- Pharyngeal Comment --  CHL IP CERVICAL ESOPHAGEAL PHASE 08/04/2017 Cervical Esophageal Phase Impaired Pudding Teaspoon -- Pudding Cup -- Honey Teaspoon -- Honey Cup -- Nectar Teaspoon -- Nectar Cup -- Nectar Straw -- Thin Teaspoon -- Thin Cup Reduced cricopharyngeal relaxation Thin Straw -- Puree Reduced cricopharyngeal relaxation Mechanical Soft -- Regular -- Multi-consistency -- Pill -- Cervical Esophageal Comment -- [name], MS, CCC-SLP Speech-Language Pathologist [phone] No flowsheet data found. [name] 08/04/2017, 1:14 PM               Assessment: 71 y.o. Caucasian male, with past medical history of chronic mild anemia and thrombocytopenia, last seen by my partner Dr. Alen Blew in 03/2017, Parkinson disease, hypertension, CAD, history of TIA, hypothyroidism, presented to Hospital was black stool, ongoing weight loss, nausea and anorexia.   1. Worsening anemia and thrombocytopenia, possible related to his dental bleeding vs bone marrow disease, lab evidence of DIC  2. Anorexia and weight loss 3. Parkinson disease 4. HTN 5. CAD 6. Deconditioning  7. Dysphagia, risk of aspiration    Plan:  -I reviewed his CT chest and MRI abdomen results with pt and his wife, no definite evidence of malignancy. He does have scattered groundglass opacities in both lower lobes, likely related to his aspiration. -His speech evaluation today showed he is at high risk of aspiration. During the evaluation, he was found to have a region of prominent tissue in the supraglottic region. This is being further evaluated by CT neck now -if CT neck negative, I recommend him to consider a bone marrow biopsy, to rule out bone marrow disease. I discussed with pt and his wife  today, he is agreeable. Please consult IR  -He has no clinical bleeding, H/H has been stable, ok to change CBC daily, consider repeating DIC panel  -I will follow up    Truitt Merle, MD 08/04/2017  6:48 PM

## 2017-08-04 NOTE — Progress Notes (Signed)
PROGRESS NOTE    Kyle Espinoza  PYK:[id number] DOB: 03/26/1946 DOA: 08/02/2017 PCP: Deland Pretty, MD   Brief Narrative:  Kyle Espinoza is a 71 y.o. 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 Alen Blew, last seen in April of this year, at which time he was found to have stable counts. He was brought to the ED, after his wife found him to have increased confusion, generalized weakness, and 1 episode of what melanotic stools. She describes it as one episode of severe diarrhea without further events. This follows the recent teeth extraction. Wife reports that at the time no significant bleeding was seen.  No apparent fever, chills, chest pain or palpitations.  She denied the patient having any increase shortness of breath, or cough. No apparent sick contacts. No increased lower extremity swelling.   Has significant FTT with a 20 lb weight loss over the last 2 months. Of note, the patient has known intentional tremors, but she reports that he has a new tremor on his lips, for the last week. He is incontinent of urine, but she denies the patient having any hematuria. He was admitted for confusion and dark melanotic stools and is currently being worked up. Gastroenterology consulted and patient underwent EGD and MRI of the Abdomen showed no malignancy.   Labs Indicate that patient has DIC so Hematology Dr. Burr Medico was consulted for further evalation and recommendation. For now she advised q8H CBC's and Supportive Care.   Dr. Burr Medico recommended patient get a CT of the Chest to evaluate for malignancy but that showed scattered ground glass opacities in both lower lobes. Patient went for a SLP and was made NPO due to high risk of  aspiration. During the SLP evaluation a prominent tissue mass was found and a CT of the Neck has been ordered to better evaluate. Palliative Care medicine has also been consulted for Goals of Care.   Assessment & Plan:   Principal Problem:   GIB (gastrointestinal bleeding) Active Problems:   Hypertension   TIA (transient ischemic attack)   Pancytopenia (HCC)   Splenomegaly   Hyperlipemia   Legally blind   CKD (chronic kidney disease)   Debility   Parkinsonism (HCC)   Symptomatic anemia   Melena   Acute blood loss anemia   DIC (disseminated intravascular coagulation) (HCC)   Thrombocytopenia (HCC)  Melena and Symptomatic Anemia likely from Dental Procedure and Bleeding Gums in the setting of DIC, improving after Blood Transfusion -Visible dark stools, Hcult positive at the ED   -IV Protonix drip was started by EDP and changed to po Protonix by Gastroenterology -NPO until GI eval then advance diet; SLP consulted for evaluation and recommend NPO given high Risk of ASA -Hold NSAIDs/ ASA -Anemia panel as Below -CT A/P with oral contrast r/o colitis or bleeding sources not done this time and was done recently as GI wanted MRI of Abdomen to further evaluate Mass on Kidney which showed no evidence of malignancy  DIC in the setting of known splenomegaly, and current bleeding issues, with gingival bleeding and significant bruising  -Checked DIC panel; D-Dimer was >20.00 and Fibrinogen was 136 -PT was 17.0 and INR was 1.39; APTT was 35 -Smear Schistocytes  -C/w IVF Rehydration -Discussed Case with Dr. Burr Medico of Hematology  and for now continue supportive care and Monitor CBC's q8h and provide Blood Transfusions as Necessary; Dr. Burr Medico now recommending to change CBC's back to daily now that labs are improving  -Appreciate Dr. Ernestina Penna evaluation of the Patient  -C/w Unasyn for Infection Coverage  -Dr. Burr Medico recommending obtaining Repeat DIC Panel and obtaining Chest CT -Chest CT negative for  malignancy however patient had Swallow Screen which showed mass in Neck. Will Check CT of Neck and if that is negative Dr. Burr Medico Recommending Bone Marrow Biopsy to be done by IR  Anemia of chronic disease and ABLA s/p Transfusion of 2 units of pRBCs -Hemoglobin on admission 7.9 Hcult + ; Hb/Hct Trended down to 6.3/19.1 so patient was transfused 2 units of pRBCs and Hb/Hct improved to 9.6/29.1 -Anemia Panel showed Iron of 29, UIBC of 161, TIBC of 190, Saturation Ratios of 15, Ferritin of 735, Folate of 21.9 and Vitamin B12 of 3,381 -Gastroenterology Consulted and patient underwent EGD -Continue to Monitor for S/Sx of Bleeding  -C/w Iron Supplementation with Ferrous Sulfate 325 mg po BID -Repeat CBC Daily now  Pancytopenia, improving  -WBC was 2.4, Hb/Hct was 8.5/26.2, and Platelet Count was 60  -Now WBC is 4.3, Hb/Hct is 9.6/29.1 and Platelet Count is 81 -Continue to Monitor and Repeat CBC in AM -As above  Acute encephalopathy, improved per Wife appears Baseline -Etiology unclear. UA was negative.  -WBC normal on admission and now decreased -Afebrile. Prior h/o TIA  -CT head r/o masses or bleeding showed Senescent changes without acute or reversible finding. -Vitamin B12 was normal as well as Ammonia Level -C/w IVF Rehydration; Changed from NS to D5W + KCl given patient's NPO status -Unasyn for infection coverage   AKI on CKD Stage 2 -Improved with IVF Rehydration and Blood Administration -BUN/Cr went from 29/1.24 -> 21/1.11 -> 15/1.21 -Continue to Monitor and Repeat CMP in AM   Hypertension Will need to hold home anti-hypertensive medications now that patient is NPO Add Hydralazine Q6 hours as needed for BP 160/90    Hyperlipidemia -Held home Atorvastatin 10 mg po Daily as patient is NPO  Hypothyroidism: -Change home Synthroid to IV -Checked TSH and was 1.351  Dementia; Parkinsons  -not on meds at this time   Dysphagia with Mass noted on Swallow Eval -STRICT NPO and  consider Cortrak for Nutrition/Hydration -Check CT of Neck -Pending CT results consult ENT; If CT of Neck is Negative then will need Bone Marrow Biopsy -Palliative Care consulted for further Goals of Care   DVT prophylaxis: SCDs Code Status: DO NOT RESUSCITATE Family Communication: Discussed with Family at bedside Disposition Plan: PT recommending SNF  Consultants:   Gastroenterology Dr. Hilarie Fredrickson   Hematology Dr. Burr Medico  Palliative Care Medicine   Procedures:  EGD Findings:      The examined esophagus was normal.      The entire examined stomach was normal.      A large non-bleeding diverticulum was found in the area of the papilla.      The exam of the duodenum was otherwise normal. Impression:               - Old, dried blood in the mouth without active                            bleeding (recent gingival bleeding treated by  endodontist).                           - Normal esophagus.                           - Normal stomach.                           - Non-bleeding duodenal diverticulum.                           - Otherwise normal duodenum.                           - No evidence of upper GI bleeding.                           - No specimens collected.   Antimicrobials:  Anti-infectives    Start     Dose/Rate Route Frequency Ordered Stop   08/02/17 1300  Ampicillin-Sulbactam (UNASYN) 3 g in sodium chloride 0.9 % 100 mL IVPB     3 g 200 mL/hr over 30 Minutes Intravenous Every 8 hours 08/02/17 1230       Subjective: Seen and examined at bedside with his wife after several imaging studies and was incomprehensible. Wife understands how sick he is. Patient unable to provide a subjective history. Wife states he has had been busy doing tests.   Objective: Vitals:   08/04/17 0512 08/04/17 0918 08/04/17 1415 08/04/17 1700  BP: 129/62 139/64 139/67 (!) 133/54  Pulse: 89 95 88 74  Resp: _0 Temp: 97.9 F (36.6 C) 98.2 F (36.8 C) 97.9  F (36.6 C) 97.8 F (36.6 C)  TempSrc: Oral Oral Oral Oral  SpO2: 100% 100% 100% 100%  Weight:      Height:        Intake/Output Summary (Last 24 hours) at 08/04/17 2031 Last data filed at 08/03/17 2100  Gross per 24 hour  Intake                0 ml  Output              200 ml  Net             -200 ml   Filed Weights   08/02/17 1200 08/02/17 1642  Weight: 53.5 kg (117 lb 15.1 oz) 51.1 kg (112 lb 11.2 oz)   Examination: Physical Exam:  Constitutional: Frail thin cachectic ill appearing Caucasian male in NAD Eyes: Lids normal, Sclerae anicteric.   ENMT: Poor dentition and had dried blood in mouth. Hard of hearing and have to talk into Right ear.  Neck: no JVD; Palpable edema and ? mass Respiratory: Diminished to auscultation. No wheezing/rales but had crackles at the bases. Patient was not tachypenic or using any accessory muscles to breathe Cardiovascular: RRR; No extremity edema Abdomen: Soft, NT, ND. Bowel sounds present GU: Deferred. Musculoskeletal: No contractures; No cyanosis Skin: Has severe bruising in Upper extremities. No rashes. Skin is cold but dry.  Neurologic: Patient is incomprehensible but is able to understand some. No gross focal deficits.  Psychiatric: Normal appearing mood. Awake.   Data Reviewed: I have personally reviewed following labs and imaging studies  CBC:  Recent Labs Lab 08/03/17  0013 08/03/17 0939 08/03/17 2032 08/04/17 0630 08/04/17 1439  WBC 2.3* 2.4* 3.9* 3.2* 4.3  NEUTROABS  --  1.6* 2.8 2.2 3.1  HGB 6.2* 8.5* 8.9* 9.0* 9.6*  HCT 18.8* 26.2* 26.9* 26.8* 29.1*  MCV 86.2 87.0 85.9 85.4 85.3  PLT 54* 60* 69* 65* 81*   Basic Metabolic Panel:  Recent Labs Lab 08/02/17 0850 08/03/17 0013 08/03/17 0939 08/04/17 0630  NA 141 140 143 145  K 3.8 3.5 3.7 3.7  CL 108 111 111 113*  CO2 22 21* 21* 21*  GLUCOSE 133* 99 90 74  BUN 43* 29* 21* 15  CREATININE 1.60* 1.24 1.11 1.21  CALCIUM 9.2 8.2* 8.4* 8.5*  MG  --   --  2.1 2.0    PHOS  --   --  3.1 3.6   GFR: Estimated Creatinine Clearance: 39.9 mL/min (by C-G formula based on SCr of 1.21 mg/dL). Liver Function Tests:  Recent Labs Lab 08/02/17 0850 08/03/17 0013 08/03/17 0939 08/04/17 0630  AST _0 ALT 18 16* 17 18  ALKPHOS 51 38 41 40  BILITOT 0.7 0.7 0.6 1.0  PROT 6.1* 4.8* 5.3* 5.1*  ALBUMIN 3.9 3.0* 3.2* 3.0*   No results for input(s): LIPASE, AMYLASE in the last 168 hours.  Recent Labs Lab 08/02/17 1154  AMMONIA 10   Coagulation Profile:  Recent Labs Lab 08/02/17 1154 08/03/17 0013 08/04/17 0630  INR 1.32  1.27 1.39 1.47   Cardiac Enzymes:  Recent Labs Lab 08/02/17 1154  TROPONINI <0.03   BNP (last 3 results) No results for input(s): PROBNP in the last 8760 hours. HbA1C: No results for input(s): HGBA1C in the last 72 hours. CBG:  Recent Labs Lab 08/04/17 1626 08/04/17 1715 08/04/17 2024  GLUCAP 61* 110* 91   Lipid Profile: No results for input(s): CHOL, HDL, LDLCALC, TRIG, CHOLHDL, LDLDIRECT in the last 72 hours. Thyroid Function Tests:  Recent Labs  08/02/17 1154  TSH 1.351   Anemia Panel:  Recent Labs  08/02/17 1154  VITAMINB12 3,381*  FOLATE 21.9  FERRITIN 735*  TIBC 190*  IRON 29*  RETICCTPCT 1.2   Sepsis Labs: No results for input(s): PROCALCITON, LATICACIDVEN in the last 168 hours.  Recent Results (from the past 240 hour(s))  Culture, blood (Routine X 2) w Reflex to ID Panel     Status: None (Preliminary result)   Collection Time: 08/02/17  5:08 PM  Result Value Ref Range Status   Specimen Description BLOOD RIGHT ARM  Final   Special Requests IN PEDIATRIC BOTTLE Blood Culture adequate volume  Final   Culture NO GROWTH 2 DAYS  Final   Report Status PENDING  Incomplete  Culture, blood (Routine X 2) w Reflex to ID Panel     Status: None (Preliminary result)   Collection Time: 08/02/17  5:27 PM  Result Value Ref Range Status   Specimen Description BLOOD RIGHT ARM  Final   Special  Requests IN PEDIATRIC BOTTLE Blood Culture adequate volume  Final   Culture NO GROWTH 2 DAYS  Final   Report Status PENDING  Incomplete    Radiology Studies: Ct Soft Tissue Neck Wo Contrast  Result Date: 08/04/2017 CLINICAL DATA:  71 year old male with abnormal modified barium swallow. Possible neck or pharyngeal mass. EXAM: CT NECK WITHOUT CONTRAST TECHNIQUE: Multidetector CT imaging of the neck was performed following the standard protocol without intravenous contrast. COMPARISON:  Modified barium swallow images 1223 hours today. Chest CT 1241 hours today. Head CT without  contrast 08/02/2017. FINDINGS: Pharynx and larynx: Laryngeal soft tissue contours are within normal limits. There is a small volume of retained barium in the hypopharynx including the vallecula and piriform sinuses. There is asymmetric caudal extension of this retained barium at the right hypopharynx which could indicate a small pharyngocele(coronal image 43). Oropharynx contours are normal. Negative nasopharynx. Small volume of retained barium in the oral cavity. Negative parapharyngeal and retropharyngeal spaces. Salivary glands: Negative noncontrast sublingual space, submandibular glands and parotid glands. Thyroid: Diminutive, negative. Lymph nodes: No lymphadenopathy identified. Vascular: Vascular patency is not evaluated in the absence of IV contrast. Mild for age carotid bifurcation calcified plaque. Mild distal vertebral artery calcified plaque. Limited intracranial: Negative. Visualized orbits: Negative. Mastoids and visualized paranasal sinuses: Visualized paranasal sinuses and mastoids are stable and well pneumatized. Skeleton: Previous C4 through C7 level ACDF. Solid-appearing arthrodesis. Hardware remains in place. There is mild anterior protrusion of the C7 cortical screws, more so the left, but no evidence of hardware loosening at that level. Mild anterolisthesis of C7 on T1 associated with facet hypertrophy. No acute osseous  abnormality identified. Upper chest: Chest findings today are reported separately. Small volume retained barium in the visible esophagus. IMPRESSION: 1. No pharyngeal abnormality identified aside from a possible small right pharyngocele at the hypopharynx. Small volume retained barium in the oral cavity, pharynx, and visible esophagus. 2. No neck mass or lymphadenopathy identified in the absence of IV contrast. 3. Previous C4 through C7 level ACDF with solid appearing arthrodesis. 4. Chest CT today reported separately. Electronically Signed   By: Genevie Ann M.D.   On: 08/04/2017 18:51   Ct Chest Wo Contrast  Result Date: 08/04/2017 CLINICAL DATA:  Shortness of breath EXAM: CT CHEST WITHOUT CONTRAST TECHNIQUE: Multidetector CT imaging of the chest was performed following the standard protocol without IV contrast. COMPARISON:  01/10/2008 FINDINGS: Cardiovascular: Normal heart size. Scattered coronary calcifications. No pericardial effusion. Patchy calcifications in the aortic arch in descending thoracic segment without aneurysm. Mediastinum/Nodes: No enlarged mediastinal or axillary lymph nodes. Thyroid gland, trachea, and esophagus demonstrate no significant findings. Residual contrast in the mid esophagus from recent swallow study. Lungs/Pleura: Scattered clustered ground-glass opacities in the superior segment left upper lobe, and in the medial basal segment right lower lobe, up to 8 mm. Linear scarring or subsegmental atelectasis posteriorly in the left lower lobe. No pneumothorax. No effusion. Upper Abdomen: No acute findings. Residual contrast in the gastric lumen from recent swallowing study. Musculoskeletal: Cervical fixation hardware partially visualized. Anterior vertebral endplate spurring at multiple levels in the mid thoracic spine. Negative for fracture or worrisome bone lesion. IMPRESSION: 1. Scattered cluster ground-glass opacities in both lower lobes, likely infectious/inflammatory. Non-contrast chest  CT at 3-6 months is recommended. If nodules persist, subsequent management will be based upon the most suspicious nodule(s). This recommendation follows the consensus statement: Guidelines for Management of Incidental Pulmonary Nodules Detected on CT Images: From the Fleischner Society 2017; Radiology 2017; 284:228-243. 2. Coronary and Aortic Atherosclerosis (ICD10-170.0) Electronically Signed   By: Lucrezia Europe M.D.   On: 08/04/2017 13:56   Mr Abdomen Wo Contrast  Result Date: 08/04/2017 CLINICAL DATA:  Weight loss.  Unintended weight loss.  Pancytopenia. EXAM: MRI ABDOMEN WITHOUT CONTRAST TECHNIQUE: Multiplanar multisequence MR imaging was performed without the administration of intravenous contrast. COMPARISON:  CT 07/10/2016, retrograde pyelogram 08/10/2016 FINDINGS: Lower chest:  Lung bases are clear. Hepatobiliary: No focal hepatic lesion. No duct dilatation. Several large gallstones within lumen gallbladder. No gallbladder stones or inflammation. Common  bile duct normal caliber. Some motion degradation of the exam. Pancreas: Motion degradation limits evaluation the pancreas. No pancreatic lesion identified. No pancreatic duct dilatation. Spleen: Normal volume spleen measures spleen measures 12.0 x 10.2 x 5.1 cm (volume = 330 cm^3). Adrenals/urinary tract: Adrenal glands are normal. No renal abnormality identified. No abnormality collecting system of the LEFT kidney. No IV contrast was administered. Stomach/Bowel: Stomach and limited of the small bowel is unremarkable Vascular/Lymphatic: Abdominal aortic normal caliber. No retroperitoneal periportal lymphadenopathy. Musculoskeletal: No aggressive osseous lesion IMPRESSION: 1. No evidence of malignancy non contrast abdominal MRI. 2. Exam is limited by lack of IV contrast and patient motion. 3. No abnormality of the LEFT kidney. Of note patient has what appears to be normal retrograde pyelogram on 08/10/2016. Recommend correlation with a [facility] notes.  4. Cholelithiasis without cholecystitis. Electronically Signed   By: Suzy Bouchard M.D.   On: 08/04/2017 09:31   Dg Swallowing Func-speech Pathology  Result Date: 08/04/2017 Objective Swallowing Evaluation: Type of Study: MBS-Modified Barium Swallow Study Patient Details Name: Hendry SHELLHAMMER MRN: [id number] Date of Birth: [birth year] Today's Date: 08/04/2017 Time: SLP Start Time (ACUTE ONLY): 1200-SLP Stop Time (ACUTE ONLY): 1220 SLP Time Calculation (min) (ACUTE ONLY): 20 min Past Medical History: Past Medical History: Diagnosis Date . Anemia  . CKD (chronic kidney disease), stage II  . 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 History of adenomatous polyp of colon   hyperplastic  2013 . History of kidney stones  . History of transient ischemic attack (TIA)   01-07-2012 . Hyperlipidemia  . Hypertension  . Hypothyroidism  . Impairment of balance  . Legally blind in left eye, as defined in [place]   CONGENITAL . OA (osteoarthritis)  . Pancytopenia ([place])  . Parkinsonism ([place])   UNSPECIFIED type --  NEUROLOGIST-- DR Star Age . Splenomegaly  . Ureteral tumor   left upper tract . Wears dentures   full upper and partial lower . Wears glasses  . Wears hearing aid   bilateral hears ---  very hoh over phone Past Surgical History: Past Surgical History: Procedure Laterality Date . ANTERIOR CERVICAL DECOMP/DISCECTOMY FUSION  06/04/2008  C4 -- C7 . CATARACT EXTRACTION W/ INTRAOCULAR LENS  IMPLANT, BILATERAL  2016 . COLONOSCOPY  last one 07-26-2012 . CYSTOSCOPY WITH RETROGRADE PYELOGRAM, URETEROSCOPY AND STENT PLACEMENT Left 08/10/2016  Procedure: CYSTOSCOPY WITH LEFT  RETROGRADE PYELOGRAM, URETEROSCOPY URETERAL BIOPSY,  AND STENT PLACEMENT;  Surgeon: Ardis Hughs, MD;  Location: [facility];  Service: Urology;  Laterality: Left; . CYSTOSCOPY/RETROGRADE/URETEROSCOPY/STONE EXTRACTION WITH BASKET  1987 . INGUINAL HERNIA REPAIR Right 1980's . TRANSTHORACIC ECHOCARDIOGRAM  01/09/2012  mild focal basal LVH,   ef 55-60%,  grade 1 diastolic dysfunction/  mild AV stenosis , no regurg. 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 He was brought to the ED, after his wife found him tohave increased confusion, generalized weakness, and 1 episode of what melanotic stools. This follows the recent teeth extraction. She deniedthe patient having any increase shortness of breath, or cough. Has significant FTT with a 20 lb weight loss over the last 2 months. She reports that he has a new  tremor on his lips, for the last week. He was admitted for confusion and dark melanotic stools and is currently being worked up. Gastroenterology consulted and patient underwent EGD revealing normal esophagus and stomach, non-bleeding duodenal diverticulum, no evidence of upper GI bleed. Non contrast abdominal MRI revealed no evidence of malignancy. CT head with no acute findings. CXR 8/30 showed no active disease. Per notes, pt coughing with liquids. Referred for bedside swallowing evaluation.  Subjective: upright in chair, wife present Assessment / Plan / Recommendation [facility] IP CLINICAL IMPRESSIONS 08/04/2017 Clinical Impression Patient presents with moderate oral and severe pharyngoesophageal dysphagia and severe risk for aspiration. Pt's oral phase characterized by lingual weakness, sluggish oral transit. Swallow initiation is relatively swift once bolus reaches the base of tongue, however pt's pharygneal swallow is impacted by what appears to be a region of prominent tissue in the supraglottic region that appears to protrude into the pharynx, impeding hyolaryngeal excursion  and passage of boluses through the pharynx. Palpation of this region notable for edema. As a result, severe residue with puree (100% of the bolus) is retained in the valleculae. Attempted postural manuevers including chin tuck, head turn, liquid wash which were not effective in facilitating bolus clearance. Thin liquid wash mixed with vallecular residue which then spilled into the pyriform sinuses and upper laryngeal vestibule. Approx 10% of the bolus passed through the UES, however severe residue remained above the UES, along the posterior pharynx, pyriforms and in the valleculae. Patient is at severe risk for aspiration of residue with all consistencies. Recommend NPO, pt may benefit from cortrak for alternative means of nutrition/hydration. Recommend ENT consult; pt may also benefit from palliative consult given pt's comorbidities which may impact his ability to safely resume oral feeding. MD paged. Will follow for further education, goals of care.  SLP Visit Diagnosis Dysphagia, pharyngoesophageal phase (R13.14);Dysphagia, oropharyngeal phase (R13.12) Attention and concentration deficit following -- Frontal lobe and executive function deficit following -- Impact on safety and function Severe aspiration risk;Risk for inadequate nutrition/hydration   [facility] IP TREATMENT RECOMMENDATION 08/04/2017 Treatment Recommendations Therapy as outlined in treatment plan below   Prognosis 08/04/2017 Prognosis for Safe Diet Advancement Guarded Barriers to Reach Goals Severity of deficits;Other (Comment) Barriers/Prognosis Comment -- [facility] IP DIET RECOMMENDATION 08/04/2017 SLP Diet Recommendations NPO Liquid Administration via -- Medication Administration -- Compensations -- Postural Changes --   [facility] IP OTHER RECOMMENDATIONS 08/04/2017 Recommended Consults Consider ENT evaluation;Other (Comment) Oral Care Recommendations Oral care QID Other Recommendations Remove water pitcher;Have oral suction available   [facility] IP FOLLOW UP RECOMMENDATIONS  08/04/2017 Follow up Recommendations Skilled Nursing facility   [facility] IP FREQUENCY AND DURATION 08/04/2017 Speech Therapy Frequency (ACUTE ONLY) min 2x/week Treatment Duration 2 weeks      [facility] IP ORAL PHASE 08/04/2017 Oral Phase Impaired Oral - Pudding Teaspoon -- Oral - Pudding Cup -- Oral - Honey Teaspoon -- Oral - Honey Cup -- Oral - Nectar Teaspoon -- Oral - Nectar Cup -- Oral - Nectar Straw -- Oral - Thin Teaspoon -- Oral - Thin Cup Weak lingual manipulation;Lingual pumping;Holding of bolus;Delayed oral transit Oral - Thin Straw -- Oral - Puree Weak lingual manipulation;Lingual pumping;Reduced posterior propulsion;Delayed oral transit Oral - Mech Soft -- Oral - Regular -- Oral - Multi-Consistency -- Oral - Pill -- Oral Phase - Comment --  CH[facility] IP PHARYNGEAL PHASE 08/04/2017 Pharyngeal Phase Impaired Pharyngeal- Pudding Teaspoon -- Pharyngeal -- Pharyngeal- Pudding Cup -- Pharyngeal -- Pharyngeal- Honey Teaspoon -- Pharyngeal -- Pharyngeal- Honey Cup -- Pharyngeal --  Pharyngeal- Nectar Teaspoon -- Pharyngeal -- Pharyngeal- Nectar Cup -- Pharyngeal -- Pharyngeal- Nectar Straw -- Pharyngeal -- Pharyngeal- Thin Teaspoon -- Pharyngeal -- Pharyngeal- Thin Cup Reduced pharyngeal peristalsis;Reduced epiglottic inversion;Reduced anterior laryngeal mobility;Reduced laryngeal elevation;Reduced airway/laryngeal closure;Penetration/Apiration after swallow;Pharyngeal residue - valleculae;Pharyngeal residue - pyriform;Pharyngeal residue - posterior pharnyx;Pharyngeal residue - cp segment Pharyngeal Material enters airway, remains ABOVE vocal cords and not ejected out Pharyngeal- Thin Straw -- Pharyngeal -- Pharyngeal- Puree Reduced epiglottic inversion;Reduced anterior laryngeal mobility;Reduced laryngeal elevation;Reduced airway/laryngeal closure;Pharyngeal residue - valleculae Pharyngeal -- Pharyngeal- Mechanical Soft -- Pharyngeal -- Pharyngeal- Regular -- Pharyngeal -- Pharyngeal- Multi-consistency -- Pharyngeal -- Pharyngeal- Pill  -- Pharyngeal -- Pharyngeal Comment --  [facility] IP CERVICAL ESOPHAGEAL PHASE 08/04/2017 Cervical Esophageal Phase Impaired Pudding Teaspoon -- Pudding Cup -- Honey Teaspoon -- Honey Cup -- Nectar Teaspoon -- Nectar Cup -- Nectar Straw -- Thin Teaspoon -- Thin Cup Reduced cricopharyngeal relaxation Thin Straw -- Puree Reduced cricopharyngeal relaxation Mechanical Soft -- Regular -- Multi-consistency -- Pill -- Cervical Esophageal Comment -- [name], MS, CCC-SLP Speech-Language Pathologist [phone] No flowsheet data found. [name] 08/04/2017, 1:14 PM              Scheduled Meds: . amLODipine  10 mg Oral q morning - 10a  . atorvastatin  10 mg Oral q morning - 10a  . dextrose      . feeding supplement  1 Container Oral TID BM  . ferrous sulfate  325 mg Oral BID WC  . levothyroxine  112 mcg Oral QAC breakfast  . losartan  25 mg Oral q morning - 10a  . multivitamin with minerals  1 tablet Oral Daily  . pantoprazole  40 mg Oral Q0600  . vitamin B-12  1,000 mcg Oral Daily   Continuous Infusions: . ampicillin-sulbactam (UNASYN) IV Stopped (08/04/17 1350)  . dextrose 5 % with KCl 20 mEq / L 20 mEq (08/04/17 1703)    LOS: 2 days   Kerney Elbe, DO [facility] Pager [phone]  If 7PM-7AM, please contact night-coverage [email or web address] Password [facility] 08/04/2017, 8:31 PM

## 2017-08-05 ENCOUNTER — Encounter (HOSPITAL_COMMUNITY): Payer: Self-pay | Admitting: Internal Medicine

## 2017-08-05 DIAGNOSIS — Z515 Encounter for palliative care: Secondary | ICD-10-CM

## 2017-08-05 LAB — COMPREHENSIVE METABOLIC PANEL
ALBUMIN: 2.9 g/dL — AB (ref 3.5–5.0)
ALT: 16 U/L — AB (ref 17–63)
AST: 25 U/L (ref 15–41)
Alkaline Phosphatase: 38 U/L (ref 38–126)
Anion gap: 8 (ref 5–15)
BUN: 10 mg/dL (ref 6–20)
CHLORIDE: 112 mmol/L — AB (ref 101–111)
CO2: 22 mmol/L (ref 22–32)
CREATININE: 1.06 mg/dL (ref 0.61–1.24)
Calcium: 8.4 mg/dL — ABNORMAL LOW (ref 8.9–10.3)
GFR calc non Af Amer: >60 mL/min (ref >60)
GLUCOSE: 108 mg/dL — AB (ref 65–99)
Potassium: 3.7 mmol/L (ref 3.5–5.1)
SODIUM: 142 mmol/L (ref 135–145)
Total Bilirubin: 0.6 mg/dL (ref 0.3–1.2)
Total Protein: 5.1 g/dL — ABNORMAL LOW (ref 6.5–8.1)

## 2017-08-05 LAB — CBC WITH DIFFERENTIAL/PLATELET
BASOS ABS: 0 10*3/uL (ref 0.0–0.1)
BASOS PCT: 0 %
EOS ABS: 0 10*3/uL (ref 0.0–0.7)
EOS PCT: 1 %
HCT: 25.9 % — ABNORMAL LOW (ref 39.0–52.0)
HEMOGLOBIN: 8.7 g/dL — AB (ref 13.0–17.0)
LYMPHS ABS: 0.8 10*3/uL (ref 0.7–4.0)
Lymphocytes Relative: 29 %
MCH: 28.4 pg (ref 26.0–34.0)
MCHC: 33.6 g/dL (ref 30.0–36.0)
MCV: 84.6 fL (ref 78.0–100.0)
MONOS PCT: 11 %
Monocytes Absolute: 0.3 10*3/uL (ref 0.1–1.0)
NEUTROS ABS: 1.6 10*3/uL — AB (ref 1.7–7.7)
Neutrophils Relative %: 59 %
PLATELETS: 72 10*3/uL — AB (ref 150–400)
RBC: 3.06 MIL/uL — AB (ref 4.22–5.81)
RDW: 14.7 % (ref 11.5–15.5)
WBC: 2.7 10*3/uL — ABNORMAL LOW (ref 4.0–10.5)

## 2017-08-05 LAB — GLUCOSE, CAPILLARY
GLUCOSE-CAPILLARY: 100 mg/dL — AB (ref 65–99)
GLUCOSE-CAPILLARY: 98 mg/dL (ref 65–99)
Glucose-Capillary: 107 mg/dL — ABNORMAL HIGH (ref 65–99)
Glucose-Capillary: 107 mg/dL — ABNORMAL HIGH (ref 65–99)
Glucose-Capillary: 119 mg/dL — ABNORMAL HIGH (ref 65–99)
Glucose-Capillary: 98 mg/dL (ref 65–99)

## 2017-08-05 LAB — MAGNESIUM: Magnesium: 1.9 mg/dL (ref 1.7–2.4)

## 2017-08-05 LAB — DIC (DISSEMINATED INTRAVASCULAR COAGULATION) PANEL
APTT: 31 s (ref 24–36)
FIBRINOGEN: 64 mg/dL — AB (ref 210–475)

## 2017-08-05 LAB — DIC (DISSEMINATED INTRAVASCULAR COAGULATION)PANEL
INR: 1.54
Platelets: 69 10*3/uL — ABNORMAL LOW (ref 150–400)
Prothrombin Time: 18.3 seconds — ABNORMAL HIGH (ref 11.4–15.2)

## 2017-08-05 LAB — PHOSPHORUS: PHOSPHORUS: 2.7 mg/dL (ref 2.5–4.6)

## 2017-08-05 NOTE — Progress Notes (Signed)
PROGRESS NOTE    Kyle Espinoza  YYQ:[id number] DOB: [birth year] DOA: 08/02/2017 PCP: Deland Pretty, MD   Brief Narrative:  Kyle Espinoza is a 71 y.o. 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 Alen Blew, last seen in April of this year, at which time he was found to have stable counts. He was brought to the ED, after his wife found him to have increased confusion, generalized weakness, and 1 episode of what melanotic stools. She describes it as one episode of severe diarrhea without further events. This follows the recent teeth extraction. Wife reports that at the time no significant bleeding was seen.  No apparent fever, chills, chest pain or palpitations.  She denied the patient having any increase shortness of breath, or cough. No apparent sick contacts. No increased lower extremity swelling.   Has significant FTT with a 20 lb weight loss over the last 2 months. Of note, the patient has known intentional tremors, but she reports that he has a new tremor on his lips, for the last week. He is incontinent of urine, but she denies the patient having any hematuria. He was admitted for confusion and dark melanotic stools and is currently being worked up. Gastroenterology consulted and patient underwent EGD and MRI of the Abdomen showed no malignancy.   Labs Indicate that patient has DIC so Hematology Dr. Burr Medico was consulted for further evalation and recommendation. For now she advised q8H CBC's and Supportive Care.   Dr. Burr Medico recommended patient get a CT of the Chest to evaluate for malignancy but that showed scattered ground glass opacities in both lower lobes. Patient went for a SLP and was made NPO due to high risk of  aspiration. During the SLP evaluation a prominent tissue mass was found and a CT of the Neck has been ordered to better evaluate. CT of the neck showed a small Pharyngocele but no mass was identified. Palliative Care medicine has also been consulted for Goals of Care. I discussed that I thought the patient was nearing End of Life and family open to Hospice but would like to discuss it with the family. Patient wants to go home and has made it clear to family that he does. Palliative Care meeting in AM.   Assessment & Plan:   Principal Problem:   GIB (gastrointestinal bleeding) Active Problems:   Hypertension   TIA (transient ischemic attack)   Pancytopenia (HCC)   Splenomegaly   Hyperlipemia   Legally blind   CKD (chronic kidney disease)   Debility   Parkinsonism (HCC)   Symptomatic anemia   Melena   Acute blood loss anemia   DIC (disseminated intravascular coagulation) (HCC)   Thrombocytopenia ([place])   Palliative care by specialist  Melena and Symptomatic Anemia likely from Dental Procedure and Bleeding Gums in the setting of DIC, improved after Blood Transfusion -Visible dark stools, Hcult positive at the ED   -IV Protonix drip was started by EDP and changed to po Protonix by Gastroenterology -NPO until GI eval then advance diet; SLP consulted for evaluation and recommend NPO given high Risk of Aspiration -Hold NSAIDs/ ASA -Anemia panel as Below -CT A/P with oral contrast r/o colitis or bleeding sources not done this time and was done recently as GI wanted MRI of  Abdomen to further evaluate Mass on Kidney which showed no evidence of malignancy  DIC in the setting of known splenomegaly, and current bleeding issues, with gingival bleeding and significant bruising  -Checked Initial DIC panel; D-Dimer was >20.00 and Fibrinogen was 136; Repeat showed D-Dimer >20.00 and Fibrinogen was 64; Smear showed Schistocytes -PT was 17.0 and INR was 1.39; APTT was 35 -Smear Schistocytes  -C/w IVF  Rehydration -Discussed Case with Dr. Burr Medico of Hematology and for now continue supportive care and Monitor CBC's q8h and provide Blood Transfusions as Necessary; Dr. Burr Medico now recommending to change CBC's back to daily now that labs are improving  -Appreciate Dr. Ernestina Penna evaluation of the Patient  -C/w Unasyn for Infection Coverage  -Dr. Burr Medico recommending obtaining Repeat DIC Panel and obtaining Chest CT -Chest CT negative for malignancy however patient had Swallow Screen which showed mass in Neck. Will Check CT of Neck and if that is negative Dr. Burr Medico Recommending Bone Marrow Biopsy to be done by IR; Family does not want patient to under go Biopsy -Family and Patient considering Hospice and transition to [place]; Would like to discuss it with themselves and then meet with Palliative Care in AM   Anemia of chronic disease and ABLA s/p Transfusion of 2 units of pRBCs -Hemoglobin on admission 7.9 Hcult + ; Hb/Hct Trended down to 6.3/19.1 so patient was transfused 2 units of pRBCs and Hb/Hct improved to 9.6/29.1 and now trended back down to 8.7/25.9 -Anemia Panel showed Iron of 29, UIBC of 161, TIBC of 190, Saturation Ratios of 15, Ferritin of 735, Folate of 21.9 and Vitamin B12 of 3,381 -Gastroenterology Consulted and patient underwent EGD -Continue to Monitor for S/Sx of Bleeding  -C/w Iron Supplementation with Ferrous Sulfate 325 mg po BID -Repeat CBC Daily   Pancytopenia -WBC was 2.4, Hb/Hct was 8.5/26.2, and Platelet Count was 60  -Now WBC is 2.7, Hb/Hct is 8.7/25.9 and Platelet Count is 69 -Continue to Monitor and Repeat CBC in AM -As above  Acute encephalopathy, improved per Wife appears Baseline -Etiology unclear. UA was negative.  -WBC normal on admission and now decreased -Afebrile. Prior h/o TIA  -CT head r/o masses or bleeding showed Senescent changes without acute or reversible finding. -Vitamin B12 was normal as well as Ammonia Level -C/w IVF Rehydration; Changed from NS to  D5W + KCl given patient's NPO status -Unasyn for infection coverage   AKI on CKD Stage 2 -Improved with IVF Rehydration and Blood Administration -BUN/Cr went from 29/1.24 -> 21/1.11 -> 15/1.21 -> 10/1.06 -Continue to Monitor and Repeat CMP in AM   Hypertension -Will need to hold home anti-hypertensive medications now that patient is NPO -Add Hydralazine Q6 hours as needed for BP 160/90    Hyperlipidemia -Held home Atorvastatin 10 mg po Daily as patient is NPO  Hypothyroidism: -Change home Synthroid to IV -Checked TSH and was 1.351  Dementia; Parkinsons  -Not on meds at this time   Dysphagia with Mass noted on Swallow Eval -STRICT NPO and consider Cortrak for Nutrition/Hydration but would be hesitant to put one in given DIC -Check CT of Neck showed No pharyngeal abnormality identified aside from a possible small right pharyngocele at the hypopharynx. Small volume retained barium in the oral cavity, pharynx, and visible esophagus. No neck mass or lymphadenopathy identified in the absence of IV contrast. Previous C4 through C7 level ACDF with solid appearing arthrodesis. -CT of Neck is Negative so will need Bone Marrow Biopsy but Family refusing -Palliative Care consulted  for further Goals of Care and if patient is transitioned to Hospice/Comfort Care will allow Comfort feedings knowing that patient will aspirate.   DVT prophylaxis: SCDs Code Status: DO NOT RESUSCITATE Family Communication: Discussed with Family at bedside Disposition Plan: PT recommending SNF but Family considering going home with Hospice  Consultants:   Gastroenterology Dr. Hilarie Fredrickson   Hematology Dr. Burr Medico  Palliative Care Medicine   Procedures:  EGD Findings:      The examined esophagus was normal.      The entire examined stomach was normal.      A large non-bleeding diverticulum was found in the area of the papilla.      The exam of the duodenum was otherwise normal. Impression:               -  Old, dried blood in the mouth without active                            bleeding (recent gingival bleeding treated by                            endodontist).                           - Normal esophagus.                           - Normal stomach.                           - Non-bleeding duodenal diverticulum.                           - Otherwise normal duodenum.                           - No evidence of upper GI bleeding.                           - No specimens collected.   Antimicrobials:  Anti-infectives    Start     Dose/Rate Route Frequency Ordered Stop   08/02/17 1300  Ampicillin-Sulbactam (UNASYN) 3 g in sodium chloride 0.9 % 100 mL IVPB     3 g 200 mL/hr over 30 Minutes Intravenous Every 8 hours 08/02/17 1230       Subjective: Seen and examined at bedside and patient understood what was going and was alert. He was incomprehensible at times but stated he wanted to go home. No nausea or vomiting. Family does not want bone marrow biopsy. Family to discuss about Hospice and meed with Palliative in AM.   Objective: Vitals:   08/05/17 0511 08/05/17 0956 08/05/17 1323 08/05/17 1655  BP: (!) 115/59 (!) 117/50 (!) 142/58 137/62  Pulse: 68 73 67 68  Resp: _0 Temp: 98 F (36.7 C) 97.7 F (36.5 C) 97.8 F (36.6 C) 97.9 F (36.6 C)  TempSrc: Axillary Oral Oral Axillary  SpO2: 99% 99% 99% 99%  Weight:      Height:        Intake/Output Summary (Last 24 hours) at 08/05/17 1918 Last data filed at 08/05/17 1915  Gross per 24 hour  Intake  0 ml  Output              950 ml  Net             -950 ml   Filed Weights   08/02/17 1200 08/02/17 1642 08/05/17 0500  Weight: 53.5 kg (117 lb 15.1 oz) 51.1 kg (112 lb 11.2 oz) 54.2 kg (119 lb 8 oz)   Examination: Physical Exam:  Constitutional: Thin cachectic frail appearing Caucasian male in NAD laying in bed Eyes: Sclerae anicteric; Lids normal ENMT: Grossly normal hearing. External ears appear  normal Neck: No JVD; No visible Cervical Lymphadenopathy  Respiratory: Diminished but no appreciable wheezing/rales/rhonchi. Patient was not tachypenic or using any accessory muscles to breathe Cardiovascular: RRR; No extremity edema Abdomen: Soft, NT, ND. Bowel Sounds present GU: Deferred Musculoskeletal: No contractures; No cyanosis Skin: Warm and had bruising. No rashes appreciated Neurologic: Incomprehensible at times but able to articulate and understand. No appreciable focal deficits Psychiatric: Awake; Normal appearing mood.   Data Reviewed: I have personally reviewed following labs and imaging studies  CBC:  Recent Labs Lab 08/03/17 0939 08/03/17 2032 08/04/17 0630 08/04/17 1439 08/05/17 0623  WBC 2.4* 3.9* 3.2* 4.3 2.7*  NEUTROABS 1.6* 2.8 2.2 3.1 1.6*  HGB 8.5* 8.9* 9.0* 9.6* 8.7*  HCT 26.2* 26.9* 26.8* 29.1* 25.9*  MCV 87.0 85.9 85.4 85.3 84.6  PLT 60* 69* 65* 81* 69*  72*   Basic Metabolic Panel:  Recent Labs Lab 08/02/17 0850 08/03/17 0013 08/03/17 0939 08/04/17 0630 08/05/17 0623  NA 141 140 143 145 142  K 3.8 3.5 3.7 3.7 3.7  CL 108 111 111 113* 112*  CO2 22 21* 21* 21* 22  GLUCOSE 133* 99 90 74 108*  BUN 43* 29* 21* 15 10  CREATININE 1.60* 1.24 1.11 1.21 1.06  CALCIUM 9.2 8.2* 8.4* 8.5* 8.4*  MG  --   --  2.1 2.0 1.9  PHOS  --   --  3.1 3.6 2.7   GFR: Estimated Creatinine Clearance: 48.3 mL/min (by C-G formula based on SCr of 1.06 mg/dL). Liver Function Tests:  Recent Labs Lab 08/02/17 0850 08/03/17 0013 08/03/17 0939 08/04/17 0630 08/05/17 0623  AST _0 ALT 18 16* 17 18 16*  ALKPHOS 51 38 41 40 38  BILITOT 0.7 0.7 0.6 1.0 0.6  PROT 6.1* 4.8* 5.3* 5.1* 5.1*  ALBUMIN 3.9 3.0* 3.2* 3.0* 2.9*   No results for input(s): LIPASE, AMYLASE in the last 168 hours.  Recent Labs Lab 08/02/17 1154  AMMONIA 10   Coagulation Profile:  Recent Labs Lab 08/02/17 1154 08/03/17 0013 08/04/17 0630 08/05/17 0623  INR 1.32  1.27  1.39 1.47 1.54   Cardiac Enzymes:  Recent Labs Lab 08/02/17 1154  TROPONINI <0.03   BNP (last 3 results) No results for input(s): PROBNP in the last 8760 hours. HbA1C: No results for input(s): HGBA1C in the last 72 hours. CBG:  Recent Labs Lab 08/05/17 0043 08/05/17 0509 08/05/17 0739 08/05/17 1214 08/05/17 1619  GLUCAP 98 100* 98 107* 107*   Lipid Profile: No results for input(s): CHOL, HDL, LDLCALC, TRIG, CHOLHDL, LDLDIRECT in the last 72 hours. Thyroid Function Tests: No results for input(s): TSH, T4TOTAL, FREET4, T3FREE, THYROIDAB in the last 72 hours. Anemia Panel: No results for input(s): VITAMINB12, FOLATE, FERRITIN, TIBC, IRON, RETICCTPCT in the last 72 hours. Sepsis Labs: No results for input(s): PROCALCITON, LATICACIDVEN in the last 168 hours.  Recent Results (from the past 240 hour(s))  Culture, blood (Routine X 2) w Reflex to ID Panel     Status: None (Preliminary result)   Collection Time: 08/02/17  5:08 PM  Result Value Ref Range Status   Specimen Description BLOOD RIGHT ARM  Final   Special Requests IN PEDIATRIC BOTTLE Blood Culture adequate volume  Final   Culture NO GROWTH 3 DAYS  Final   Report Status PENDING  Incomplete  Culture, blood (Routine X 2) w Reflex to ID Panel     Status: None (Preliminary result)   Collection Time: 08/02/17  5:27 PM  Result Value Ref Range Status   Specimen Description BLOOD RIGHT ARM  Final   Special Requests IN PEDIATRIC BOTTLE Blood Culture adequate volume  Final   Culture NO GROWTH 3 DAYS  Final   Report Status PENDING  Incomplete    Radiology Studies: Ct Soft Tissue Neck Wo Contrast  Result Date: 08/04/2017 CLINICAL DATA:  71 year old male with abnormal modified barium swallow. Possible neck or pharyngeal mass. EXAM: CT NECK WITHOUT CONTRAST TECHNIQUE: Multidetector CT imaging of the neck was performed following the standard protocol without intravenous contrast. COMPARISON:  Modified barium swallow images 1223  hours today. Chest CT 1241 hours today. Head CT without contrast 08/02/2017. FINDINGS: Pharynx and larynx: Laryngeal soft tissue contours are within normal limits. There is a small volume of retained barium in the hypopharynx including the vallecula and piriform sinuses. There is asymmetric caudal extension of this retained barium at the right hypopharynx which could indicate a small pharyngocele(coronal image 43). Oropharynx contours are normal. Negative nasopharynx. Small volume of retained barium in the oral cavity. Negative parapharyngeal and retropharyngeal spaces. Salivary glands: Negative noncontrast sublingual space, submandibular glands and parotid glands. Thyroid: Diminutive, negative. Lymph nodes: No lymphadenopathy identified. Vascular: Vascular patency is not evaluated in the absence of IV contrast. Mild for age carotid bifurcation calcified plaque. Mild distal vertebral artery calcified plaque. Limited intracranial: Negative. Visualized orbits: Negative. Mastoids and visualized paranasal sinuses: Visualized paranasal sinuses and mastoids are stable and well pneumatized. Skeleton: Previous C4 through C7 level ACDF. Solid-appearing arthrodesis. Hardware remains in place. There is mild anterior protrusion of the C7 cortical screws, more so the left, but no evidence of hardware loosening at that level. Mild anterolisthesis of C7 on T1 associated with facet hypertrophy. No acute osseous abnormality identified. Upper chest: Chest findings today are reported separately. Small volume retained barium in the visible esophagus. IMPRESSION: 1. No pharyngeal abnormality identified aside from a possible small right pharyngocele at the hypopharynx. Small volume retained barium in the oral cavity, pharynx, and visible esophagus. 2. No neck mass or lymphadenopathy identified in the absence of IV contrast. 3. Previous C4 through C7 level ACDF with solid appearing arthrodesis. 4. Chest CT today reported separately.  Electronically Signed   By: Genevie Ann M.D.   On: 08/04/2017 18:51   Ct Chest Wo Contrast  Result Date: 08/04/2017 CLINICAL DATA:  Shortness of breath EXAM: CT CHEST WITHOUT CONTRAST TECHNIQUE: Multidetector CT imaging of the chest was performed following the standard protocol without IV contrast. COMPARISON:  01/10/2008 FINDINGS: Cardiovascular: Normal heart size. Scattered coronary calcifications. No pericardial effusion. Patchy calcifications in the aortic arch in descending thoracic segment without aneurysm. Mediastinum/Nodes: No enlarged mediastinal or axillary lymph nodes. Thyroid gland, trachea, and esophagus demonstrate no significant findings. Residual contrast in the mid esophagus from recent swallow study. Lungs/Pleura: Scattered clustered ground-glass opacities in the superior segment left upper lobe, and in the medial basal segment right lower lobe, up to  8 mm. Linear scarring or subsegmental atelectasis posteriorly in the left lower lobe. No pneumothorax. No effusion. Upper Abdomen: No acute findings. Residual contrast in the gastric lumen from recent swallowing study. Musculoskeletal: Cervical fixation hardware partially visualized. Anterior vertebral endplate spurring at multiple levels in the mid thoracic spine. Negative for fracture or worrisome bone lesion. IMPRESSION: 1. Scattered cluster ground-glass opacities in both lower lobes, likely infectious/inflammatory. Non-contrast chest CT at 3-6 months is recommended. If nodules persist, subsequent management will be based upon the most suspicious nodule(s). This recommendation follows the consensus statement: Guidelines for Management of Incidental Pulmonary Nodules Detected on CT Images: From the Fleischner Society 2017; Radiology 2017; 284:228-243. 2. Coronary and Aortic Atherosclerosis (ICD10-170.0) Electronically Signed   By: Lucrezia Europe M.D.   On: 08/04/2017 13:56   Mr Abdomen Wo Contrast  Result Date: 08/04/2017 CLINICAL DATA:  Weight loss.   Unintended weight loss.  Pancytopenia. EXAM: MRI ABDOMEN WITHOUT CONTRAST TECHNIQUE: Multiplanar multisequence MR imaging was performed without the administration of intravenous contrast. COMPARISON:  CT 07/10/2016, retrograde pyelogram 08/10/2016 FINDINGS: Lower chest:  Lung bases are clear. Hepatobiliary: No focal hepatic lesion. No duct dilatation. Several large gallstones within lumen gallbladder. No gallbladder stones or inflammation. Common bile duct normal caliber. Some motion degradation of the exam. Pancreas: Motion degradation limits evaluation the pancreas. No pancreatic lesion identified. No pancreatic duct dilatation. Spleen: Normal volume spleen measures spleen measures 12.0 x 10.2 x 5.1 cm (volume = 330 cm^3). Adrenals/urinary tract: Adrenal glands are normal. No renal abnormality identified. No abnormality collecting system of the LEFT kidney. No IV contrast was administered. Stomach/Bowel: Stomach and limited of the small bowel is unremarkable Vascular/Lymphatic: Abdominal aortic normal caliber. No retroperitoneal periportal lymphadenopathy. Musculoskeletal: No aggressive osseous lesion IMPRESSION: 1. No evidence of malignancy non contrast abdominal MRI. 2. Exam is limited by lack of IV contrast and patient motion. 3. No abnormality of the LEFT kidney. Of note patient has what appears to be normal retrograde pyelogram on 08/10/2016. Recommend correlation with a [facility] notes. 4. Cholelithiasis without cholecystitis. Electronically Signed   By: Suzy Bouchard M.D.   On: 08/04/2017 09:31   Dg Swallowing Func-speech Pathology  Result Date: 08/04/2017 Objective Swallowing Evaluation: Type of Study: MBS-Modified Barium Swallow Study Patient Details Name: TAE VONADA MRN: [id number] Date of Birth: 09-Nov-1946 Today's Date: 08/04/2017 Time: SLP Start Time (ACUTE ONLY): 1200-SLP Stop Time (ACUTE ONLY): 1220 SLP Time Calculation (min) (ACUTE ONLY): 20 min Past Medical History: Past Medical History:  Diagnosis Date . Anemia  . CKD (chronic kidney disease), stage II  . 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 History of adenomatous polyp of colon   hyperplastic  2013 . History of kidney stones  . History of transient ischemic attack (TIA)   01-07-2012 . Hyperlipidemia  . Hypertension  . Hypothyroidism  . Impairment of balance  . Legally blind in left eye, as defined in [place]   CONGENITAL . OA (osteoarthritis)  . Pancytopenia ([place])  . Parkinsonism ([place])   UNSPECIFIED type --  NEUROLOGIST-- DR Star Age . Splenomegaly  . Ureteral tumor   left upper tract . Wears dentures   full upper and partial lower . Wears glasses  . Wears hearing aid   bilateral hears ---  very hoh over phone Past Surgical History: Past Surgical History: Procedure Laterality Date . ANTERIOR CERVICAL DECOMP/DISCECTOMY FUSION  06/04/2008  C4 -- C7 . CATARACT EXTRACTION W/ INTRAOCULAR LENS  IMPLANT, BILATERAL  2016 . COLONOSCOPY  last one 07-26-2012 . CYSTOSCOPY WITH RETROGRADE PYELOGRAM, URETEROSCOPY AND STENT PLACEMENT Left 08/10/2016  Procedure: CYSTOSCOPY WITH LEFT  RETROGRADE PYELOGRAM, URETEROSCOPY URETERAL BIOPSY,  AND STENT PLACEMENT;  Surgeon: Ardis Hughs, MD;  Location[facility];  Service: Urology;  Laterality: Left; . CYSTOSCOPY/RETROGRADE/URETEROSCOPY/STONE EXTRACTION WITH BASKET  1987 . INGUINAL HERNIA REPAIR Right 1980's . TRANSTHORACIC ECHOCARDIOGRAM  01/09/2012  mild focal basal LVH,  ef 55-60%,  grade 1 diastolic dysfunction/  mild AV stenosis , no regurg. 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 He was brought to the ED,  after his wife found him tohave increased confusion, generalized weakness, and 1 episode of what melanotic stools. This follows the recent teeth extraction. She deniedthe patient having any increase shortness of breath, or cough. Has significant FTT with a 20 lb weight loss over the last 2 months. She reports that he has a new tremor on his lips, for the last week. He was admitted for confusion and dark melanotic stools and is currently being worked up. Gastroenterology consulted and patient underwent EGD revealing normal esophagus and stomach, non-bleeding duodenal diverticulum, no evidence of upper GI bleed. Non contrast abdominal MRI revealed no evidence of malignancy. CT head with no acute findings. CXR 8/30 showed no active disease. Per notes, pt coughing with liquids. Referred for bedside swallowing evaluation.  Subjective: upright in chair, wife present Assessment / Plan / Recommendation [facility] IP CLINICAL IMPRESSIONS 08/04/2017 Clinical Impression Patient presents with moderate oral and severe pharyngoesophageal dysphagia and severe risk for aspiration. Pt's oral phase characterized by lingual weakness, sluggish oral transit. Swallow initiation is relatively swift once bolus reaches the base of tongue, however pt's pharygneal swallow is impacted by what appears to be a region of prominent tissue in the supraglottic region that appears to protrude into the pharynx, impeding hyolaryngeal excursion and passage of boluses through the pharynx. Palpation of this region notable for edema. As a result, severe residue with puree (100% of the bolus) is retained in the valleculae. Attempted postural manuevers including chin tuck, head turn, liquid wash which were not effective in facilitating bolus clearance. Thin liquid wash mixed with vallecular residue which then spilled into the pyriform sinuses and upper laryngeal vestibule. Approx 10% of the bolus passed through the UES, however severe residue remained above the  UES, along the posterior pharynx, pyriforms and in the valleculae. Patient is at severe risk for aspiration of residue with all consistencies. Recommend NPO, pt may benefit from cortrak for alternative means of nutrition/hydration. Recommend ENT consult; pt may also benefit from palliative consult given pt's comorbidities which may impact his ability to safely resume oral feeding. MD paged. Will follow for further education, goals of care.  SLP Visit Diagnosis Dysphagia, pharyngoesophageal phase (R13.14);Dysphagia, oropharyngeal phase (R13.12) Attention and concentration deficit following -- Frontal lobe and executive function deficit following -- Impact on safety and function Severe aspiration risk;Risk for inadequate nutrition/hydration   CHL IP TREATMENT RECOMMENDATION 08/04/2017 Treatment Recommendations Therapy as outlined in treatment plan below   Prognosis 08/04/2017 Prognosis for Safe Diet Advancement Guarded Barriers to Reach Goals Severity of deficits;Other (Comment) Barriers/Prognosis Comment -- CHL IP DIET RECOMMENDATION 08/04/2017 SLP Diet Recommendations NPO  Liquid Administration via -- Medication Administration -- Compensations -- Postural Changes --   CHL IP OTHER RECOMMENDATIONS 08/04/2017 Recommended Consults Consider ENT evaluation;Other (Comment) Oral Care Recommendations Oral care QID Other Recommendations Remove water pitcher;Have oral suction available   [facility] IP FOLLOW UP RECOMMENDATIONS 08/04/2017 Follow up Recommendations Skilled Nursing facility   [facility] IP FREQUENCY AND DURATION 08/04/2017 Speech Therapy Frequency (ACUTE ONLY) min 2x/week Treatment Duration 2 weeks      [facility]L IP ORAL PHASE 08/04/2017 Oral Phase Impaired Oral - Pudding Teaspoon -- Oral - Pudding Cup -- Oral - Honey Teaspoon -- Oral - Honey Cup -- Oral - Nectar Teaspoon -- Oral - Nectar Cup -- Oral - Nectar Straw -- Oral - Thin Teaspoon -- Oral - Thin Cup Weak lingual manipulation;Lingual pumping;Holding of bolus;Delayed oral transit Oral -  Thin Straw -- Oral - Puree Weak lingual manipulation;Lingual pumping;Reduced posterior propulsion;Delayed oral transit Oral - Mech Soft -- Oral - Regular -- Oral - Multi-Consistency -- Oral - Pill -- Oral Phase - Comment --  CHL IP PHARYNGEAL PHASE 08/04/2017 Pharyngeal Phase Impaired Pharyngeal- Pudding Teaspoon -- Pharyngeal -- Pharyngeal- Pudding Cup -- Pharyngeal -- Pharyngeal- Honey Teaspoon -- Pharyngeal -- Pharyngeal- Honey Cup -- Pharyngeal -- Pharyngeal- Nectar Teaspoon -- Pharyngeal -- Pharyngeal- Nectar Cup -- Pharyngeal -- Pharyngeal- Nectar Straw -- Pharyngeal -- Pharyngeal- Thin Teaspoon -- Pharyngeal -- Pharyngeal- Thin Cup Reduced pharyngeal peristalsis;Reduced epiglottic inversion;Reduced anterior laryngeal mobility;Reduced laryngeal elevation;Reduced airway/laryngeal closure;Penetration/Apiration after swallow;Pharyngeal residue - valleculae;Pharyngeal residue - pyriform;Pharyngeal residue - posterior pharnyx;Pharyngeal residue - cp segment Pharyngeal Material enters airway, remains ABOVE vocal cords and not ejected out Pharyngeal- Thin Straw -- Pharyngeal -- Pharyngeal- Puree Reduced epiglottic inversion;Reduced anterior laryngeal mobility;Reduced laryngeal elevation;Reduced airway/laryngeal closure;Pharyngeal residue - valleculae Pharyngeal -- Pharyngeal- Mechanical Soft -- Pharyngeal -- Pharyngeal- Regular -- Pharyngeal -- Pharyngeal- Multi-consistency -- Pharyngeal -- Pharyngeal- Pill -- Pharyngeal -- Pharyngeal Comment --  CHL IP CERVICAL ESOPHAGEAL PHASE 08/04/2017 Cervical Esophageal Phase Impaired Pudding Teaspoon -- Pudding Cup -- Honey Teaspoon -- Honey Cup -- Nectar Teaspoon -- Nectar Cup -- Nectar Straw -- Thin Teaspoon -- Thin Cup Reduced cricopharyngeal relaxation Thin Straw -- Puree Reduced cricopharyngeal relaxation Mechanical Soft -- Regular -- Multi-consistency -- Pill -- Cervical Esophageal Comment -- [name], MS, CCC-SLP Speech-Language Pathologist [phone] No flowsheet  data found. [name] 08/04/2017, 1:14 PM              Scheduled Meds: . amLODipine  10 mg Oral q morning - 10a  . atorvastatin  10 mg Oral q morning - 10a  . feeding supplement  1 Container Oral TID BM  . ferrous sulfate  325 mg Oral BID WC  . levothyroxine  56 mcg Intravenous Daily  . losartan  25 mg Oral q morning - 10a  . multivitamin with minerals  1 tablet Oral Daily  . pantoprazole  40 mg Oral Q0600  . vitamin B-12  1,000 mcg Oral Daily   Continuous Infusions: . ampicillin-sulbactam (UNASYN) IV Stopped (08/05/17 1350)  . dextrose 5 % with KCl 20 mEq / L 20 mEq (08/04/17 1703)    LOS: 3 days   Kerney Elbe, DO [facility] Pager [phone]  If 7PM-7AM, please contact night-coverage [email or web address] Password [facility] 08/05/2017, 7:18 PM

## 2017-08-05 NOTE — Consult Note (Signed)
Consultation Note Date: 08/05/2017   Patient Name: Kyle Espinoza  DOB: 1945-12-15  MRN: [id number]  Age / Sex: 71 y.o., male  PCP: Deland Pretty, MD Referring Physician: Kerney Elbe, DO  Reason for Consultation: Establishing goals of care and Psychosocial/spiritual support  HPI/Patient Profile: 71 y.o. male  with past medical history of Chronic kidney disease stage II, left eye blindness, TIA in 2013, hyperlipidemia, hypertension, hypothyroidism, impaired balance, osteoarthritis, history of Parkinson's followed by neurology history of splenomegaly, history of pancytopenia followed by Dr. Clydene Laming (last seen in April 2018) admitted on 08/02/2017 with increased confusion, generalized weakness and an episode of melenic stools. This follows recent teeth extraction that spouse reports bled profusely.  She reports a failure to thrive picture with a 20 pound weight loss over a two-month period, loss of appetite. He has become incontinent of urine. Gastroenterology was consulted and patient underwent an EGD, MRI of the abdomen, which showed no malignancy. Labs indicate the patient has DIC so hematology has been consulted. CT of the chest was obtained to evaluate further for malignancy as underlying cause of DIC, which showed scattered groundglass opacities in both lower lobes. Patient went for a SLP which showed a prominent tissue mass. He is nothing by mouth secondary to high risk of aspiration. 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 Despite not being able to  identify clear underlying etiology of DIC, patient appears critically ill and potentially near end-of-life. Wife describes a functional decline beginning 6 months ago  Patient is confused and his healthcare proxy is his wife [name] Jurgensen. They make decisions as a family specifically patient's stepdaughter, Lorriane Shire, who is a Marine scientist and has worked in [place], is a Theatre manager for patient and her mother.    SUMMARY OF RECOMMENDATIONS   Confirm DNR/DNI Initial thoughts, but this is not definitive, is no feeding tube but they are undecided about temporary measures such as a Cortrak Per patient's spouse, patient had been approached by by Dr. Alen Blew to have bone marrow biopsy  and has refused this workup in the past.  Family acknowledges even if malignancy was definitively confirmed, patient likely too weak to undergo any treatment, even palliative Introduced the topic of eating for pleasure, comfort. Speech therapist was present in the room at this time and provided additional education on tube feedings, how they do not prevent aspiration nor necessarily prolong life. Speech therapist recommended a diet in her note if family does elect to pursue comfort feedings Introduced the topic of hospice should a more comfort care approach be desired Family wishes to talk amongst themselves at this point. Palliative medicine to stay involved and help patient and family  with further goals of care  Code Status/Advance Care Planning:  DNR   Palliative Prophylaxis:   Aspiration, Bowel Regimen, Delirium Protocol, Eye Care, Frequent Pain Assessment, Oral Care and Turn Reposition  Additional Recommendations (Limitations, Scope, Preferences):  Minimize Medications, No Hemodialysis, No Surgical Procedures and No Tracheostomy  Psycho-social/Spiritual:   Desire for further Chaplaincy support:no  Additional Recommendations: Grief/Bereavement Support and Referral to [employer]   Prognosis:    Unable to determine  Discharge Planning: To Be Determined      Primary Diagnoses: Present on Admission: . TIA (transient ischemic attack) . Hypertension . Symptomatic anemia   I have reviewed the medical record, interviewed the patient and family, and examined the patient. The following aspects are pertinent.  Past Medical History:  Diagnosis Date  . Anemia   . CKD (chronic kidney disease), stage II   . 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 History of adenomatous polyp of colon    hyperplastic  2013  . History of kidney stones   . History of transient ischemic attack (TIA)    01-07-2012  . Hyperlipidemia   . Hypertension   . Hypothyroidism   . Impairment of balance   . 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 OA (osteoarthritis)   . Pancytopenia ([place])   . Parkinsonism ([place])    UNSPECIFIED type --  NEUROLOGIST-- DR Star Age  . Splenomegaly   . Ureteral tumor    left upper tract  . Wears dentures    full upper and partial lower  . Wears glasses   . Wears hearing aid    bilateral hears ---  very hoh over phone   Social History   Social History  . Marital status: Single    Spouse name: N/A  . Number of children: 5  . Years of education: 12   Occupational History  . Retired     Social History Main Topics  . Smoking status: Never Smoker  . Smokeless tobacco: Never Used  . Alcohol use No  . Drug use: No  . Sexual activity: Not Asked   Other Topics Concern  . None   Social History Narrative   Denies caffeine use    Family History  Problem Relation Age of Onset  . Diabetes Sister    Scheduled Meds: . amLODipine  10 mg Oral q morning - 10a  . atorvastatin  10 mg Oral q morning - 10a  . feeding supplement  1 Container Oral TID BM  . ferrous sulfate  325 mg Oral BID WC  . levothyroxine  56 mcg Intravenous Daily  . losartan  25 mg Oral q morning - 10a  . multivitamin with minerals  1 tablet Oral Daily  .  pantoprazole  40 mg Oral Q0600  . vitamin B-12  1,000 mcg Oral Daily   Continuous Infusions: . ampicillin-sulbactam (UNASYN) IV 3 g (08/05/17 0517)  . dextrose 5 % with KCl 20 mEq / L 20 mEq (08/04/17 1703)   PRN Meds:.acetaminophen **OR** acetaminophen, senna-docusate Medications Prior to Admission:  Prior to Admission medications   Medication Sig Start Date End Date Taking? Authorizing Provider  amLODipine (NORVASC) 10 MG tablet Take 10 mg by mouth every morning.    Yes [provider]  amoxicillin (AMOXIL) 500 MG capsule Take 500 mg by mouth every 6 (six) hours. Has 5 days left 07/30/17  Yes [provider]  aspirin EC 325  MG EC tablet Take 1 tablet (325 mg total) by mouth daily. 01/09/12  Yes Dhungel, Nishant, MD  atorvastatin (LIPITOR) 10 MG tablet Take 10 mg by mouth every morning.    Yes [provider]  Cyanocobalamin (B-12) 1000 MCG SUBL Place 1,000 mcg under the tongue daily.    Yes [provider]  docusate sodium (COLACE) 100 MG capsule Take 100 mg by mouth every morning.   Yes [provider]  Ferrous Sulfate (IRON) 325 (65 Fe) MG TABS Take 1 tablet by mouth 2 (two) times daily.   Yes [provider]  levothyroxine (SYNTHROID, LEVOTHROID) 112 MCG tablet Take 112 mcg by mouth daily before breakfast.    Yes [provider]  losartan (COZAAR) 25 MG tablet Take 25 mg by mouth every morning.   Yes [provider]  Multiple Vitamin (MULTIVITAMIN) tablet Take 1 tablet by mouth daily.   Yes [provider]  Omega-3 Fatty Acids (FISH OIL) 1000 MG CAPS Take 3,000 mg by mouth 2 (two) times daily.   Yes [provider]  carbidopa-levodopa (SINEMET IR) 25-100 MG tablet Take 1 tablet by mouth 3 (three) times daily. Patient not taking: Reported on 07/12/2017 09/29/15   Star Age, MD   Allergies  Allergen Reactions  . Niacin And Related Nausea And Vomiting and Other (See Comments)    And dizzy  .  Phenergan [Promethazine Hcl] Other (See Comments)    hallucinations  . Valium [Diazepam] Other (See Comments)    Severe confusion  . Azithromycin Rash   Review of Systems  Unable to perform ROS: Mental status change  Skin: Positive for pallor.    Physical Exam  Constitutional:  Acutely ill appearing, frail older man  HENT:  Temporal wasting  Neck: Normal range of motion.  Cardiovascular: Normal rate.   Pulmonary/Chest: Effort normal.  Neurological:  Intermittently alert but drifts off to sleep  during conversation.His speech is dysarthric with wet vocal quality thus he is very hard to understand. He is oriented to himself but was not aware that he was at [facility]  Skin: Skin is warm and dry.  Psychiatric:  Confused  Nursing note and vitals reviewed.   Vital Signs: BP (!) 117/50 (BP Location: Left Arm)   Pulse 73   Temp 97.7 F (36.5 C) (Oral)   Resp 16   Ht '5\' 10"'$  (1.778 m)   Wt 54.2 kg (119 lb 8 oz)   SpO2 99%   BMI 17.15 kg/m  Pain Assessment: Faces   Pain Score: 0-No pain   SpO2: SpO2: 99 % O2 Device:SpO2: 99 % O2 Flow Rate: .O2 Flow Rate (L/min): 2 L/min  IO: Intake/output summary:  Intake/Output Summary (Last 24 hours) at 08/05/17 1117 Last data filed at 08/05/17 0511  Gross per 24 hour  Intake                0 ml  Output              250 ml  Net             -250 ml    LBM: Last BM Date: 08/02/17 Baseline Weight: Weight: 53.5 kg (117 lb 15.1 oz) Most recent weight: Weight: 54.2 kg (119 lb 8 oz)     Palliative Assessment/Data:   Flowsheet Rows     Most Recent Value  Intake Tab  Referral Department  Hospitalist  Unit at Time of Referral  Med/Surg Unit  Palliative Care Primary Diagnosis  Other (  Comment)  Date Notified  08/04/17  Palliative Care Type  New Palliative care  Reason for referral  Clarify Goals of Care  Date of Admission  08/02/17  Date first seen by Palliative Care  08/05/17  # of days Palliative referral response time  1  Day(s)  # of days IP prior to Palliative referral  2  Clinical Assessment  Palliative Performance Scale Score  30%  Pain Max last 24 hours  Not able to report  Pain Min Last 24 hours  Not able to report  Dyspnea Max Last 24 Hours  Not able to report  Dyspnea Min Last 24 hours  Not able to report  Nausea Max Last 24 Hours  Not able to report  Nausea Min Last 24 Hours  Not able to report  Anxiety Max Last 24 Hours  Not able to report  Anxiety Min Last 24 Hours  Not able to report  Other Max Last 24 Hours  Not able to report  Psychosocial & Spiritual Assessment  Palliative Care Outcomes  Patient/Family meeting held?  Yes  Who was at the meeting?  pt, wife  Palliative Care follow-up planned  Yes, Facility      Time In: 0900 Time Out: 1015 Time Total: 75 min Greater than 50%  of this time was spent counseling and coordinating care related to the above assessment and plan. Staffed with Dr. Alfredia Ferguson  Signed by: Dory Horn, NP   Please contact Palliative Medicine Team phone at [phone] for questions and concerns.  For individual provider: See Shea Evans

## 2017-08-05 NOTE — Progress Notes (Addendum)
  Speech Language Pathology Treatment: Dysphagia  Patient Details Name: Kyle Espinoza MRN: [id number] DOB: 1946-11-17 Today's Date: 08/05/2017 Time: 1015-1030 SLP Time Calculation (min) (ACUTE ONLY): 15 min  Assessment / Plan / Recommendation Clinical Impression  Patient seen for education together with palliative medicine re: swallowing impairments following yesterday's MBS which showed moderate oral and severe pharyngeal dysphagia. Educated pt and his wife that pt is at severe risk for aspiration, as well as the risks of aspiration. Explained that pt's swallowing difficulties are unlikely a new problem, and given the severity of his functional impairment, overall deconditioning, and comorbidities that this is unlikely to improve. Provided education re: options for treatment including short and long term means of nutrition, comfort feeding. Pt's wife verbalizes understanding and states she would like to speak with her daughter, an Therapist, sports, who is expected to arrive shortly. At this time recommend pt remain NPO; will continue to follow for education. Should family decide on comfort feeds, I recommend dys 1 with thin liquids. Will follow closely.   Addendum: Patient's daughter present, reviewed video and results of swallow exam, prognosis and education re: aspiration risks; feeding tube will not prevent aspiration as pt is likely aspirating his secretions; will remain a high aspiration risk given poor oral health. Family will discuss and inform team of decision re: comfort feeds.    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 He was brought to the ED, after his wife found him tohave increased confusion,  generalized weakness, and 1 episode of what melanotic stools. This follows the recent teeth extraction. She deniedthe patient having any increase shortness of breath, or cough. Has significant FTT with a 20 lb weight loss over the last 2 months. She reports that he has a new tremor on his lips, for the last week. He was admitted for confusion and dark melanotic stools and is currently being worked up. Gastroenterology consulted and patient underwent EGD revealing normal esophagus and stomach, non-bleeding duodenal diverticulum, no evidence of upper GI bleed. Non contrast abdominal MRI revealed no evidence of malignancy. CT head with no acute findings. CXR 8/30 showed no active disease. Per notes, pt coughing with liquids. Referred for bedside swallowing evaluation.       SLP Plan  Continue with current plan of care;Other (Comment) (goals to be updated pending family discussion with daughter)       Recommendations  Diet recommendations: NPO;Other(comment) (if comfort feeds chosen: dys 1, thin liquids) Medication Administration: Via alternative means                Oral Care Recommendations: Oral care QID Follow up Recommendations: Other (comment) (TBD) SLP Visit Diagnosis: Dysphagia, pharyngoesophageal phase (R13.14);Dysphagia, oropharyngeal phase (R13.12) Plan: Continue with current plan of care;Other (Comment) (goals to be updated pending family discussion with daughter)       GO               [name], [place], [place] Speech-Language Pathologist [phone]  [name] 08/05/2017, 11:04 AM

## 2017-08-05 NOTE — NC FL2 (Signed)
[place] MEDICAID FL2 LEVEL OF CARE SCREENING TOOL     IDENTIFICATION  Patient Name: Kyle Espinoza Birthdate: 11-30-1946 Sex: male Admission Date (Current Location): 08/02/2017  [facility] and [place] Number:  Herbalist and Address:  [facility], [place] [address] [place], [place] [address]      Provider Number: [id number]  Attending Physician Name and Address:  Kerney Elbe, DO  Relative Name and Phone Number:       Current Level of Care: Hospital Recommended Level of Care: [place] Prior Approval Number:    Date Approved/Denied:   PASRR Number: pending  Discharge Plan: SNF    Current Diagnoses: Patient Active Problem List   Diagnosis Date Noted  . Thrombocytopenia ([place])   . DIC (disseminated intravascular coagulation) ([place]) 08/03/2017  . Melena   . Acute blood loss anemia   . Pancytopenia ([place]) 08/02/2017  . Splenomegaly 08/02/2017  . Hyperlipemia 08/02/2017  . Legally blind 08/02/2017  . CKD (chronic kidney disease) 08/02/2017  . Debility 08/02/2017  . Parkinsonism ([place]) 08/02/2017  . GIB (gastrointestinal bleeding) 08/02/2017  . Symptomatic anemia 08/02/2017  . Combined forms of age-related cataract of left eye 11/04/2015  . TIA (transient ischemic attack) 01/09/2012  . Slurred speech 01/08/2012  . Hypertension 01/08/2012    Orientation RESPIRATION BLADDER Height & Weight     [place], Place, Time  Normal Continent Weight: 119 lb 8 oz (54.2 kg) Height:  5\' 10"  (177.8 cm)  BEHAVIORAL SYMPTOMS/MOOD NEUROLOGICAL BOWEL NUTRITION STATUS      Continent  (Please see d/c summary)  AMBULATORY STATUS COMMUNICATION OF NEEDS Skin   Limited Assist Verbally Normal                       Personal Care Assistance Level of Assistance  Bathing, Feeding, Dressing Bathing Assistance: Limited assistance Feeding assistance: Independent Dressing Assistance: Limited assistance     Functional Limitations Info   Sight, Hearing, Speech Sight Info: Adequate Hearing Info: Adequate Speech Info: Impaired (Slurred/Dysarthria;Incomprehensible)    SPECIAL CARE FACTORS FREQUENCY  PT (By licensed PT), OT (By licensed OT)     PT Frequency: 3x week OT Frequency: 3x week            Contractures Contractures Info: Not present    Additional Factors Info  Code Status, Allergies Code Status Info: DNR Allergies Info: Niacin And Related, Phenergan Promethazine Hcl, Valium Diazepam, Azithromycin           Current Medications (08/05/2017):  This is the current hospital active medication list Current Facility-Administered Medications  Medication Dose Route Frequency Provider Last Rate Last Dose  . acetaminophen (TYLENOL) tablet 650 mg  650 mg Oral Q6H PRN Rondel Jumbo, PA-C       Or  . acetaminophen (TYLENOL) suppository 650 mg  650 mg Rectal Q6H PRN Rondel Jumbo, PA-C      . amLODipine (NORVASC) tablet 10 mg  10 mg Oral q morning - 10a Rondel Jumbo, PA-C   10 mg at 08/04/17 0943  . Ampicillin-Sulbactam (UNASYN) 3 g in sodium chloride 0.9 % 100 mL IVPB  3 g Intravenous Q8H Rumbarger, Rachel L, RPH 200 mL/hr at 08/05/17 0517 3 g at 08/05/17 0517  . atorvastatin (LIPITOR) tablet 10 mg  10 mg Oral q morning - 10a Rondel Jumbo, PA-C   10 mg at 08/04/17 0943  . dextrose 5 % with KCl 20 mEq / L  infusion  20 mEq Intravenous Continuous Raiford Noble [place], DO 100 mL/hr at 08/04/17 1703 20 mEq at 08/04/17 1703  . feeding supplement (BOOST / RESOURCE BREEZE) liquid 1 Container  1 Container Oral TID BM Sheikh, Omair Latif, DO      . ferrous sulfate tablet 325 mg  325 mg Oral BID WC Rondel Jumbo, PA-C   325 mg at 08/04/17 0943  . levothyroxine (SYNTHROID, LEVOTHROID) injection 56 mcg  56 mcg Intravenous Daily Sheikh, Omair Latif, DO      . losartan (COZAAR) tablet 25 mg  25 mg Oral q morning - 10a Rondel Jumbo, PA-C   25 mg at 08/04/17 0943  . multivitamin with minerals tablet 1 tablet  1 tablet  Oral Daily Elwin Mocha, MD   1 tablet at 08/04/17 0944  . pantoprazole (PROTONIX) EC tablet 40 mg  40 mg Oral Q0600 Jerene Bears, MD   40 mg at 08/04/17 0943  . senna-docusate (Senokot-S) tablet 1 tablet  1 tablet Oral QHS PRN Rondel Jumbo, PA-C      . vitamin B-12 (CYANOCOBALAMIN) tablet 1,000 mcg  1,000 mcg Oral Daily Rondel Jumbo, PA-C   1,000 mcg at 08/04/17 7588     Discharge Medications: Please see discharge summary for a list of discharge medications.  Relevant Imaging Results:  Relevant Lab Results:   Additional Information SSN: [id number]  Eileen Stanford, LCSW

## 2017-08-06 DIAGNOSIS — K922 Gastrointestinal hemorrhage, unspecified: Secondary | ICD-10-CM

## 2017-08-06 DIAGNOSIS — G9340 Encephalopathy, unspecified: Secondary | ICD-10-CM

## 2017-08-06 LAB — COMPREHENSIVE METABOLIC PANEL
ALBUMIN: 3.2 g/dL — AB (ref 3.5–5.0)
ALT: 15 U/L — AB (ref 17–63)
ANION GAP: 7 (ref 5–15)
AST: 23 U/L (ref 15–41)
Alkaline Phosphatase: 41 U/L (ref 38–126)
BUN: 5 mg/dL — ABNORMAL LOW (ref 6–20)
CALCIUM: 8.4 mg/dL — AB (ref 8.9–10.3)
CO2: 23 mmol/L (ref 22–32)
Chloride: 107 mmol/L (ref 101–111)
Creatinine, Ser: 0.99 mg/dL (ref 0.61–1.24)
GFR calc Af Amer: >60 mL/min (ref >60)
GFR calc non Af Amer: >60 mL/min (ref >60)
GLUCOSE: 116 mg/dL — AB (ref 65–99)
Potassium: 3.6 mmol/L (ref 3.5–5.1)
Sodium: 137 mmol/L (ref 135–145)
Total Bilirubin: 0.6 mg/dL (ref 0.3–1.2)
Total Protein: 5.3 g/dL — ABNORMAL LOW (ref 6.5–8.1)

## 2017-08-06 LAB — TYPE AND SCREEN
ABO/RH(D): O POS
Antibody Screen: NEGATIVE
Unit division: 0
Unit division: 0
Unit division: 0
Unit division: 0

## 2017-08-06 LAB — BPAM RBC
Blood Product Expiration Date: 201810012359
Blood Product Expiration Date: 201810012359
Blood Product Expiration Date: 201810012359
Blood Product Expiration Date: 201810022359
ISSUE DATE / TIME: 201808310043
ISSUE DATE / TIME: 201808310339
Unit Type and Rh: 5100
Unit Type and Rh: 5100
Unit Type and Rh: 5100
Unit Type and Rh: 5100

## 2017-08-06 LAB — CBC WITH DIFFERENTIAL/PLATELET
BASOS ABS: 0 10*3/uL (ref 0.0–0.1)
Basophils Relative: 0 %
EOS PCT: 2 %
Eosinophils Absolute: 0.1 10*3/uL (ref 0.0–0.7)
HEMATOCRIT: 28.4 % — AB (ref 39.0–52.0)
Hemoglobin: 9.6 g/dL — ABNORMAL LOW (ref 13.0–17.0)
LYMPHS PCT: 17 %
Lymphs Abs: 0.7 10*3/uL (ref 0.7–4.0)
MCH: 28.5 pg (ref 26.0–34.0)
MCHC: 33.8 g/dL (ref 30.0–36.0)
MCV: 84.3 fL (ref 78.0–100.0)
MONO ABS: 0.5 10*3/uL (ref 0.1–1.0)
MONOS PCT: 12 %
NEUTROS ABS: 2.7 10*3/uL (ref 1.7–7.7)
Neutrophils Relative %: 69 %
PLATELETS: 90 10*3/uL — AB (ref 150–400)
RBC: 3.37 MIL/uL — ABNORMAL LOW (ref 4.22–5.81)
RDW: 14.1 % (ref 11.5–15.5)
WBC: 3.9 10*3/uL — ABNORMAL LOW (ref 4.0–10.5)

## 2017-08-06 LAB — GLUCOSE, CAPILLARY
GLUCOSE-CAPILLARY: 89 mg/dL (ref 65–99)
GLUCOSE-CAPILLARY: 118 mg/dL — AB (ref 65–99)
GLUCOSE-CAPILLARY: 106 mg/dL — AB (ref 65–99)
Glucose-Capillary: 104 mg/dL — ABNORMAL HIGH (ref 65–99)

## 2017-08-06 LAB — PHOSPHORUS: Phosphorus: 2.5 mg/dL (ref 2.5–4.6)

## 2017-08-06 LAB — MAGNESIUM: Magnesium: 1.7 mg/dL (ref 1.7–2.4)

## 2017-08-06 LAB — DIC (DISSEMINATED INTRAVASCULAR COAGULATION) PANEL
APTT: 32 s (ref 24–36)
FIBRINOGEN: 77 mg/dL — AB (ref 210–475)
INR: 1.46
PROTHROMBIN TIME: 17.6 s — AB (ref 11.4–15.2)

## 2017-08-06 LAB — DIC (DISSEMINATED INTRAVASCULAR COAGULATION)PANEL: Platelets: 87 10*3/uL — ABNORMAL LOW (ref 150–400)

## 2017-08-06 MED ORDER — SODIUM CHLORIDE 0.9% FLUSH
3.0000 mL | Freq: Two times a day (BID) | INTRAVENOUS | Status: DC
  Administered 2017-08-06 (×2): 3 mL via INTRAVENOUS

## 2017-08-06 MED ORDER — MORPHINE SULFATE (CONCENTRATE) 10 MG/0.5ML PO SOLN: 5.0000 mg | ORAL | Status: DC | PRN

## 2017-08-06 MED ORDER — GLYCOPYRROLATE 1 MG PO TABS: 1.0000 mg | ORAL_TABLET | ORAL | Status: DC | PRN

## 2017-08-06 MED ORDER — POLYVINYL ALCOHOL 1.4 % OP SOLN
1.0000 [drp] | Freq: Four times a day (QID) | OPHTHALMIC | Status: DC | PRN
  Filled 2017-08-06: qty 15

## 2017-08-06 MED ORDER — HALOPERIDOL 1 MG PO TABS: 0.5000 mg | ORAL_TABLET | ORAL | Status: DC | PRN

## 2017-08-06 MED ORDER — PANTOPRAZOLE SODIUM 40 MG IV SOLR
40.0000 mg | Freq: Two times a day (BID) | INTRAVENOUS | Status: DC
  Administered 2017-08-06 – 2017-08-07 (×2): 40 mg via INTRAVENOUS
  Filled 2017-08-06 (×2): qty 40

## 2017-08-06 MED ORDER — GLYCOPYRROLATE 0.2 MG/ML IJ SOLN: 0.2000 mg | INTRAMUSCULAR | Status: DC | PRN

## 2017-08-06 MED ORDER — HALOPERIDOL LACTATE 5 MG/ML IJ SOLN: 0.5000 mg | INTRAMUSCULAR | Status: DC | PRN

## 2017-08-06 MED ORDER — BIOTENE DRY MOUTH MT LIQD: 15.0000 mL | OROMUCOSAL | Status: DC | PRN

## 2017-08-06 MED ORDER — SODIUM CHLORIDE 0.9% FLUSH
3.0000 mL | INTRAVENOUS | Status: DC | PRN
Start: 2017-08-06 — End: 2017-08-07

## 2017-08-06 MED ORDER — SODIUM CHLORIDE 0.9 % IV SOLN: 250.0000 mL | INTRAVENOUS | Status: DC | PRN

## 2017-08-06 MED ORDER — HALOPERIDOL LACTATE 2 MG/ML PO CONC: 0.5000 mg | ORAL | Status: DC | PRN

## 2017-08-06 NOTE — Progress Notes (Signed)
Alcario Tinkey [place]   DOB:January 20, 1946   PZ#:[id number]   EUM#:[id number]  Hematology and oncology follow-up  Subjective: Patient and family decided not to pursue bone marrow biopsy, and are considering home hospice, due to his overall poor condition. He is NPO for now due to dysphagia and risk of aspiration. No overt bleeding lately.     Objective:  Vitals:   08/06/17 0520 08/06/17 1023  BP: (!) 142/66 (!) 142/81  Pulse: 76 71  Resp: 16 17  Temp: 97.7 F (36.5 C) 97.7 F (36.5 C)  SpO2: 100% 100%    Body mass index is 17.15 kg/m.  Intake/Output Summary (Last 24 hours) at 08/06/17 1145 Last data filed at 08/06/17 1024  Gross per 24 hour  Intake                0 ml  Output             2700 ml  Net            -2700 ml     Sclerae unicteric  Oropharynx clear  No peripheral adenopathy  Lungs clear -- no rales or rhonchi  Heart regular rate and rhythm  Abdomen benign  MSK no focal spinal tenderness, no peripheral edema  Neuro nonfocal    CBG (last 3)   Recent Labs  08/06/17 0432 08/06/17 0802 08/06/17 1126  GLUCAP 104* 118* 106*     Labs:  Lab Results  Component Value Date   WBC 3.9 (L) 08/06/2017   HGB 9.6 (L) 08/06/2017   HCT 28.4 (L) 08/06/2017   MCV 84.3 08/06/2017   PLT 90 (L) 08/06/2017   PLT 87 (L) 08/06/2017   NEUTROABS 2.7 08/06/2017   CMP Latest Ref Rng & Units 08/06/2017 08/05/2017 08/04/2017  Glucose 65 - 99 mg/dL 116(H) 108(H) 74  BUN 6 - 20 mg/dL 5(L) 10 15  Creatinine 0.61 - 1.24 mg/dL 0.99 1.06 1.21  Sodium 135 - 145 mmol/L 137 142 145  Potassium 3.5 - 5.1 mmol/L 3.6 3.7 3.7  Chloride 101 - 111 mmol/L 107 112(H) 113(H)  CO2 22 - 32 mmol/L 23 22 21(L)  Calcium 8.9 - 10.3 mg/dL 8.4(L) 8.4(L) 8.5(L)  Total Protein 6.5 - 8.1 g/dL 5.3(L) 5.1(L) 5.1(L)  Total Bilirubin 0.3 - 1.2 mg/dL 0.6 0.6 1.0  Alkaline Phos 38 - 126 U/L 41 38 40  AST 15 - 41 U/L '23 25 25  '$ ALT 17 - 63 U/L 15(L) 16(L) 18     Urine Studies No results for input(s): UHGB, CRYS in the  last 72 hours.  Invalid input(s): UACOL, UAPR, USPG, UPH, UTP, UGL, [place], UBIL, UNIT, UROB, [place], UEPI, UWBC, Junie Panning [place], [place], [place]  Basic Metabolic Panel:  Recent Labs Lab 08/03/17 0013 08/03/17 0939 08/04/17 0630 08/05/17 0623 08/06/17 0447  NA 140 143 145 142 137  K 3.5 3.7 3.7 3.7 3.6  CL 111 111 113* 112* 107  CO2 21* 21* 21* 22 23  GLUCOSE 99 90 74 108* 116*  BUN 29* 21* 15 10 5*  CREATININE 1.24 1.11 1.21 1.06 0.99  CALCIUM 8.2* 8.4* 8.5* 8.4* 8.4*  MG  --  2.1 2.0 1.9 1.7  PHOS  --  3.1 3.6 2.7 2.5   GFR Estimated Creatinine Clearance: 51.7 mL/min (by C-G formula based on SCr of 0.99 mg/dL). Liver Function Tests:  Recent Labs Lab 08/03/17 0013 08/03/17 0939 08/04/17 0630 08/05/17 0623 08/06/17 0447  AST '25 29 25 25 23  '$ ALT 16* 17 18  16* 15*  ALKPHOS 38 41 40 38 41  BILITOT 0.7 0.6 1.0 0.6 0.6  PROT 4.8* 5.3* 5.1* 5.1* 5.3*  ALBUMIN 3.0* 3.2* 3.0* 2.9* 3.2*   No results for input(s): LIPASE, AMYLASE in the last 168 hours.  Recent Labs Lab 08/02/17 1154  AMMONIA 10   Coagulation profile  Recent Labs Lab 08/02/17 1154 08/03/17 0013 08/04/17 0630 08/05/17 0623 08/06/17 0447  INR 1.32  1.27 1.39 1.47 1.54 1.46    CBC:  Recent Labs Lab 08/03/17 2032 08/04/17 0630 08/04/17 1439 08/05/17 0623 08/06/17 0447  WBC 3.9* 3.2* 4.3 2.7* 3.9*  NEUTROABS 2.8 2.2 3.1 1.6* 2.7  HGB 8.9* 9.0* 9.6* 8.7* 9.6*  HCT 26.9* 26.8* 29.1* 25.9* 28.4*  MCV 85.9 85.4 85.3 84.6 84.3  PLT 69* 65* 81* 69*  72* 87*  90*   Cardiac Enzymes:  Recent Labs Lab 08/02/17 1154  TROPONINI <0.03   BNP: Invalid input(s): POCBNP CBG:  Recent Labs Lab 08/05/17 2015 08/06/17 0150 08/06/17 0432 08/06/17 0802 08/06/17 1126  GLUCAP 119* 89 104* 118* 106*   D-Dimer  Recent Labs  08/05/17 0623 08/06/17 0447  DDIMER >20.00* >20.00*   Hgb A1c No results for input(s): HGBA1C in the last 72 hours. Lipid Profile No results for input(s): CHOL, HDL,  LDLCALC, TRIG, CHOLHDL, LDLDIRECT in the last 72 hours. Thyroid function studies No results for input(s): TSH, T4TOTAL, T3FREE, THYROIDAB in the last 72 hours.  Invalid input(s): FREET3 Anemia work up No results for input(s): VITAMINB12, FOLATE, FERRITIN, TIBC, IRON, RETICCTPCT in the last 72 hours. Microbiology Recent Results (from the past 240 hour(s))  Culture, blood (Routine X 2) w Reflex to ID Panel     Status: None (Preliminary result)   Collection Time: 08/02/17  5:08 PM  Result Value Ref Range Status   Specimen Description BLOOD RIGHT ARM  Final   Special Requests IN PEDIATRIC BOTTLE Blood Culture adequate volume  Final   Culture NO GROWTH 4 DAYS  Final   Report Status PENDING  Incomplete  Culture, blood (Routine X 2) w Reflex to ID Panel     Status: None (Preliminary result)   Collection Time: 08/02/17  5:27 PM  Result Value Ref Range Status   Specimen Description BLOOD RIGHT ARM  Final   Special Requests IN PEDIATRIC BOTTLE Blood Culture adequate volume  Final   Culture NO GROWTH 4 DAYS  Final   Report Status PENDING  Incomplete      Studies:  Ct Soft Tissue Neck Wo Contrast  Result Date: 08/04/2017 CLINICAL DATA:  71 year old male with abnormal modified barium swallow. Possible neck or pharyngeal mass. EXAM: CT NECK WITHOUT CONTRAST TECHNIQUE: Multidetector CT imaging of the neck was performed following the standard protocol without intravenous contrast. COMPARISON:  Modified barium swallow images 1223 hours today. Chest CT 1241 hours today. Head CT without contrast 08/02/2017. FINDINGS: Pharynx and larynx: Laryngeal soft tissue contours are within normal limits. There is a small volume of retained barium in the hypopharynx including the vallecula and piriform sinuses. There is asymmetric caudal extension of this retained barium at the right hypopharynx which could indicate a small pharyngocele(coronal image 43). Oropharynx contours are normal. Negative nasopharynx. Small  volume of retained barium in the oral cavity. Negative parapharyngeal and retropharyngeal spaces. Salivary glands: Negative noncontrast sublingual space, submandibular glands and parotid glands. Thyroid: Diminutive, negative. Lymph nodes: No lymphadenopathy identified. Vascular: Vascular patency is not evaluated in the absence of IV contrast. Mild for age carotid bifurcation calcified plaque.  Mild distal vertebral artery calcified plaque. Limited intracranial: Negative. Visualized orbits: Negative. Mastoids and visualized paranasal sinuses: Visualized paranasal sinuses and mastoids are stable and well pneumatized. Skeleton: Previous C4 through C7 level ACDF. Solid-appearing arthrodesis. Hardware remains in place. There is mild anterior protrusion of the C7 cortical screws, more so the left, but no evidence of hardware loosening at that level. Mild anterolisthesis of C7 on T1 associated with facet hypertrophy. No acute osseous abnormality identified. Upper chest: Chest findings today are reported separately. Small volume retained barium in the visible esophagus. IMPRESSION: 1. No pharyngeal abnormality identified aside from a possible small right pharyngocele at the hypopharynx. Small volume retained barium in the oral cavity, pharynx, and visible esophagus. 2. No neck mass or lymphadenopathy identified in the absence of IV contrast. 3. Previous C4 through C7 level ACDF with solid appearing arthrodesis. 4. Chest CT today reported separately. Electronically Signed   By: Genevie Ann M.D.   On: 08/04/2017 18:51   Ct Chest Wo Contrast  Result Date: 08/04/2017 CLINICAL DATA:  Shortness of breath EXAM: CT CHEST WITHOUT CONTRAST TECHNIQUE: Multidetector CT imaging of the chest was performed following the standard protocol without IV contrast. COMPARISON:  01/10/2008 FINDINGS: Cardiovascular: Normal heart size. Scattered coronary calcifications. No pericardial effusion. Patchy calcifications in the aortic arch in descending  thoracic segment without aneurysm. Mediastinum/Nodes: No enlarged mediastinal or axillary lymph nodes. Thyroid gland, trachea, and esophagus demonstrate no significant findings. Residual contrast in the mid esophagus from recent swallow study. Lungs/Pleura: Scattered clustered ground-glass opacities in the superior segment left upper lobe, and in the medial basal segment right lower lobe, up to 8 mm. Linear scarring or subsegmental atelectasis posteriorly in the left lower lobe. No pneumothorax. No effusion. Upper Abdomen: No acute findings. Residual contrast in the gastric lumen from recent swallowing study. Musculoskeletal: Cervical fixation hardware partially visualized. Anterior vertebral endplate spurring at multiple levels in the mid thoracic spine. Negative for fracture or worrisome bone lesion. IMPRESSION: 1. Scattered cluster ground-glass opacities in both lower lobes, likely infectious/inflammatory. Non-contrast chest CT at 3-6 months is recommended. If nodules persist, subsequent management will be based upon the most suspicious nodule(s). This recommendation follows the consensus statement: Guidelines for Management of Incidental Pulmonary Nodules Detected on CT Images: From the Fleischner Society 2017; Radiology 2017; 284:228-243. 2. Coronary and Aortic Atherosclerosis (ICD10-170.0) Electronically Signed   By: Lucrezia Europe M.D.   On: 08/04/2017 13:56   Dg Swallowing Func-speech Pathology  Result Date: 08/04/2017 Objective Swallowing Evaluation: Type of Study: MBS-Modified Barium Swallow Study Patient Details Name: KIMON LOEWEN MRN: [id number] Date of Birth: 04-25-1946 Today's Date: 08/04/2017 Time: SLP Start Time (ACUTE ONLY): 1200-SLP Stop Time (ACUTE ONLY): 1220 SLP Time Calculation (min) (ACUTE ONLY): 20 min Past Medical History: Past Medical History: Diagnosis Date . Anemia  . CKD (chronic kidney disease), stage II  . 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 History of adenomatous polyp of  colon   hyperplastic  2013 . History of kidney stones  . History of transient ischemic attack (TIA)   01-07-2012 . Hyperlipidemia  . Hypertension  . Hypothyroidism  . Impairment of balance  . Legally blind in left eye, as defined in [place]   CONGENITAL . OA (osteoarthritis)  . Pancytopenia ([place])  . Parkinsonism ([place])   UNSPECIFIED type --  NEUROLOGIST-- DR Star Age . Splenomegaly  . Ureteral tumor   left upper tract . Wears dentures   full upper and partial lower . Wears  glasses  . Wears hearing aid   bilateral hears ---  very hoh over phone Past Surgical History: Past Surgical History: Procedure Laterality Date . ANTERIOR CERVICAL DECOMP/DISCECTOMY FUSION  06/04/2008  C4 -- C7 . CATARACT EXTRACTION W/ INTRAOCULAR LENS  IMPLANT, BILATERAL  2016 . COLONOSCOPY  last one 07-26-2012 . CYSTOSCOPY WITH RETROGRADE PYELOGRAM, URETEROSCOPY AND STENT PLACEMENT Left 08/10/2016  Procedure: CYSTOSCOPY WITH LEFT  RETROGRADE PYELOGRAM, URETEROSCOPY URETERAL BIOPSY,  AND STENT PLACEMENT;  Surgeon: Ardis Hughs, MD;  Location: [facility];  Service: Urology;  Laterality: Left; . CYSTOSCOPY/RETROGRADE/URETEROSCOPY/STONE EXTRACTION WITH BASKET  1987 . INGUINAL HERNIA REPAIR Right 1980's . TRANSTHORACIC ECHOCARDIOGRAM  01/09/2012  mild focal basal LVH,  ef 55-60%,  grade 1 diastolic dysfunction/  mild AV stenosis , no regurg. , valve area 2.13cm/m^2 /  atrial septum lipomatous hypertrophy/  mild TR HPI: MARCELUS DUBBERLY a 70 y.o.male with extensive medical history including CKD stage 2, left eye blindness, history of TIA in 2013, hyperlipidemia, hypertension, hypothyroidism, impairment of balance, osteoarthritis, history of parkinsonism followed by neurology, history of splenomegaly, 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 He was brought to the ED, after his wife found him tohave increased confusion, generalized weakness, and 1 episode of what melanotic stools. This follows the recent teeth  extraction. She deniedthe patient having any increase shortness of breath, or cough. Has significant FTT with a 20 lb weight loss over the last 2 months. She reports that he has a new tremor on his lips, for the last week. He was admitted for confusion and dark melanotic stools and is currently being worked up. Gastroenterology consulted and patient underwent EGD revealing normal esophagus and stomach, non-bleeding duodenal diverticulum, no evidence of upper GI bleed. Non contrast abdominal MRI revealed no evidence of malignancy. CT head with no acute findings. CXR 8/30 showed no active disease. Per notes, pt coughing with liquids. Referred for bedside swallowing evaluation.  Subjective: upright in chair, wife present Assessment / Plan / Recommendation CHL IP CLINICAL IMPRESSIONS 08/04/2017 Clinical Impression Patient presents with moderate oral and severe pharyngoesophageal dysphagia and severe risk for aspiration. Pt's oral phase characterized by lingual weakness, sluggish oral transit. Swallow initiation is relatively swift once bolus reaches the base of tongue, however pt's pharygneal swallow is impacted by what appears to be a region of prominent tissue in the supraglottic region that appears to protrude into the pharynx, impeding hyolaryngeal excursion and passage of boluses through the pharynx. Palpation of this region notable for edema. As a result, severe residue with puree (100% of the bolus) is retained in the valleculae. Attempted postural manuevers including chin tuck, head turn, liquid wash which were not effective in facilitating bolus clearance. Thin liquid wash mixed with vallecular residue which then spilled into the pyriform sinuses and upper laryngeal vestibule. Approx 10% of the bolus passed through the UES, however severe residue remained above the UES, along the posterior pharynx, pyriforms and in the valleculae. Patient is at severe risk for aspiration of residue with all consistencies.  Recommend NPO, pt may benefit from cortrak for alternative means of nutrition/hydration. Recommend ENT consult; pt may also benefit from palliative consult given pt's comorbidities which may impact his ability to safely resume oral feeding. MD paged. Will follow for further education, goals of care.  SLP Visit Diagnosis Dysphagia, pharyngoesophageal phase (R13.14);Dysphagia, oropharyngeal phase (R13.12) Attention and concentration deficit following -- Frontal lobe and executive function deficit following -- Impact on safety and function Severe aspiration  risk;Risk for inadequate nutrition/hydration   CHL IP TREATMENT RECOMMENDATION 08/04/2017 Treatment Recommendations Therapy as outlined in treatment plan below   Prognosis 08/04/2017 Prognosis for Safe Diet Advancement Guarded Barriers to Reach Goals Severity of deficits;Other (Comment) Barriers/Prognosis Comment -- CHL IP DIET RECOMMENDATION 08/04/2017 SLP Diet Recommendations NPO Liquid Administration via -- Medication Administration -- Compensations -- Postural Changes --   CHL IP OTHER RECOMMENDATIONS 08/04/2017 Recommended Consults Consider ENT evaluation;Other (Comment) Oral Care Recommendations Oral care QID Other Recommendations Remove water pitcher;Have oral suction available   CHL IP FOLLOW UP RECOMMENDATIONS 08/04/2017 Follow up Recommendations Skilled Nursing facility   [facility] IP FREQUENCY AND DURATION 08/04/2017 Speech Therapy Frequency (ACUTE ONLY) min 2x/week Treatment Duration 2 weeks      CHL IP ORAL PHASE 08/04/2017 Oral Phase Impaired Oral - Pudding Teaspoon -- Oral - Pudding Cup -- Oral - Honey Teaspoon -- Oral - Honey Cup -- Oral - Nectar Teaspoon -- Oral - Nectar Cup -- Oral - Nectar Straw -- Oral - Thin Teaspoon -- Oral - Thin Cup Weak lingual manipulation;Lingual pumping;Holding of bolus;Delayed oral transit Oral - Thin Straw -- Oral - Puree Weak lingual manipulation;Lingual pumping;Reduced posterior propulsion;Delayed oral transit Oral - Mech Soft -- Oral -  Regular -- Oral - Multi-Consistency -- Oral - Pill -- Oral Phase - Comment --  CHL IP PHARYNGEAL PHASE 08/04/2017 Pharyngeal Phase Impaired Pharyngeal- Pudding Teaspoon -- Pharyngeal -- Pharyngeal- Pudding Cup -- Pharyngeal -- Pharyngeal- Honey Teaspoon -- Pharyngeal -- Pharyngeal- Honey Cup -- Pharyngeal -- Pharyngeal- Nectar Teaspoon -- Pharyngeal -- Pharyngeal- Nectar Cup -- Pharyngeal -- Pharyngeal- Nectar Straw -- Pharyngeal -- Pharyngeal- Thin Teaspoon -- Pharyngeal -- Pharyngeal- Thin Cup Reduced pharyngeal peristalsis;Reduced epiglottic inversion;Reduced anterior laryngeal mobility;Reduced laryngeal elevation;Reduced airway/laryngeal closure;Penetration/Apiration after swallow;Pharyngeal residue - valleculae;Pharyngeal residue - pyriform;Pharyngeal residue - posterior pharnyx;Pharyngeal residue - cp segment Pharyngeal Material enters airway, remains ABOVE vocal cords and not ejected out Pharyngeal- Thin Straw -- Pharyngeal -- Pharyngeal- Puree Reduced epiglottic inversion;Reduced anterior laryngeal mobility;Reduced laryngeal elevation;Reduced airway/laryngeal closure;Pharyngeal residue - valleculae Pharyngeal -- Pharyngeal- Mechanical Soft -- Pharyngeal -- Pharyngeal- Regular -- Pharyngeal -- Pharyngeal- Multi-consistency -- Pharyngeal -- Pharyngeal- Pill -- Pharyngeal -- Pharyngeal Comment --  CHL IP CERVICAL ESOPHAGEAL PHASE 08/04/2017 Cervical Esophageal Phase Impaired Pudding Teaspoon -- Pudding Cup -- Honey Teaspoon -- Honey Cup -- Nectar Teaspoon -- Nectar Cup -- Nectar Straw -- Thin Teaspoon -- Thin Cup Reduced cricopharyngeal relaxation Thin Straw -- Puree Reduced cricopharyngeal relaxation Mechanical Soft -- Regular -- Multi-consistency -- Pill -- Cervical Esophageal Comment -- [name], MS, CCC-SLP Speech-Language Pathologist [phone] No flowsheet data found. [name] 08/04/2017, 1:14 PM               Assessment: 71 y.o. Caucasian male, with past medical history of chronic mild anemia and  thrombocytopenia, last seen by my partner Dr. Alen Blew in 03/2017, Parkinson disease, hypertension, CAD, history of TIA, hypothyroidism, presented to Hospital was black stool, ongoing weight loss, nausea and anorexia.   1. Worsening anemia and thrombocytopenia, possible related to his dental bleeding vs bone marrow disease, lab evidence of DIC  2. Anorexia and weight loss 3. Parkinson disease 4. HTN 5. CAD 6. Deconditioning  7. Dysphagia, high risk of aspiration    Plan:  -His scans were negative for malignancy  -he has persistent DIC, no clinical bleeding or thrombosis -His pancytopenia has slightly improved -Due to his dysphagia, anorexia, weight loss and deconditioning, failure to thrive, both patient and family members are considering hospice, which  is very reasonable. Will not pursue bone marrow biopsy. -He knows to call us if he has further questions in the future.   Truitt Merle, MD 08/06/2017  11:45 AM

## 2017-08-06 NOTE — Progress Notes (Signed)
Pharmacy Antibiotic Note  Kyle Espinoza is a 71 y.o. male admitted on 08/02/2017 with intra-abdominal infxn.  Pharmacy has been consulted for Unasyn dosing. Patient is on Day #4 of Unasyn therapy.  Patient is afebrile and WBC are WNL. SCr is back at baseline.   Plan: Unasyn 3gm IV Q8H Continue to follow cultures, LOT, and clinical status  Height: 5\' 10"  (177.8 cm) Weight: 119 lb 8 oz (54.2 kg) IBW/kg (Calculated) : 73  Temp (24hrs), Avg:97.9 F (36.6 C), Min:97.7 F (36.5 C), Max:98.3 F (36.8 C)   Recent Labs Lab 08/03/17 0013 08/03/17 0939 08/03/17 2032 08/04/17 0630 08/04/17 1439 08/05/17 0623 08/06/17 0447  WBC 2.3* 2.4* 3.9* 3.2* 4.3 2.7* 3.9*  CREATININE 1.24 1.11  --  1.21  --  1.06 0.99    Estimated Creatinine Clearance: 51.7 mL/min (by C-G formula based on SCr of 0.99 mg/dL).    Allergies  Allergen Reactions  . Niacin And Related Nausea And Vomiting and Other (See Comments)    And dizzy  . Phenergan [Promethazine Hcl] Other (See Comments)    hallucinations  . Valium [Diazepam] Other (See Comments)    Severe confusion  . Azithromycin Rash    Antimicrobials this admission: Unasyn 8/30>>  Microbiology results: 8/30 blood x 2 - NGTD  Thank you for allowing pharmacy to be a part of this patient's care.   Diana L. Kyung Rudd, PharmD, [place] PGY1 Pharmacy Resident Pager: [phone]

## 2017-08-06 NOTE — Progress Notes (Signed)
RN page notified MD of Fibrinogen critical lab value of 77.

## 2017-08-06 NOTE — Progress Notes (Addendum)
Daily Progress Note   Patient Name: Kyle Espinoza       Date: 08/06/2017 DOB: 12/20/1945  Age: 71 y.o. MRN#: [id number] Attending Physician: Kerney Elbe, DO Primary Care Physician: Deland Pretty, MD Admit Date: 08/02/2017  Reason for Consultation/Follow-up: Establishing goals of care  Subjective: Met at bedside with patient, wife and step dtr. Brother and sister in Sports [employer].  Spoke with step dtr and wife about [facility] - patient is at risk to have a large hemorrhage and it is ill advised to take him home.  Wife and step dtr agree to [facility] and asked me to discuss it with the patient.  I described [facility] as a 24 hour care facility where his wife can stay with him.  He was mildly confused but was comfortable as long as his wife was with him.   Assessment: Patient mildy confused.  He is quite ill and unable to take any PO nutrition.  Pleasure feeds requested by family.  He is at high risk for bleeding given DIC.   Patient Profile/HPI: 71 y.o. male  with past medical history of Chronic kidney disease stage II, left eye blindness, TIA in 2013, hyperlipidemia, hypertension, hypothyroidism, impaired balance, osteoarthritis, history of Parkinson's followed by neurology history of splenomegaly, history of pancytopenia followed by Dr. Clydene Laming (last seen in April 2018) admitted on 08/02/2017 with increased confusion, generalized weakness and an episode of melenic stools. This follows recent teeth extraction that spouse reports bled profusely.  He reports a failure to thrive picture with a 20 pound weight loss over a two-month period, loss of appetite. He has become incontinent of urine. Gastroenterology was consulted and patient underwent an EGD, MRI of the abdomen, which showed no  malignancy. Labs indicate the patient has DIC so hematology has been consulted. CT of the chest was obtained to evaluate further for malignancy as underlying cause of DIC, which showed scattered groundglass opacities in both lower lobes. Patient went for a SLP which showed a prominent tissue mass. He is nothing by mouth secondary to high risk of aspiration. This was followed by a CT of the neck which revealed a possible small, right, pharyngocele; no neck mass identified    Length of Stay: 4  Current Medications: Scheduled Meds:  . amLODipine  10 mg Oral q morning -  10a  . atorvastatin  10 mg Oral q morning - 10a  . feeding supplement  1 Container Oral TID BM  . ferrous sulfate  325 mg Oral BID WC  . levothyroxine  56 mcg Intravenous Daily  . losartan  25 mg Oral q morning - 10a  . multivitamin with minerals  1 tablet Oral Daily  . pantoprazole  40 mg Oral Q0600  . vitamin B-12  1,000 mcg Oral Daily    Continuous Infusions: . ampicillin-sulbactam (UNASYN) IV 3 g (08/06/17 0522)  . dextrose 5 % with KCl 20 mEq / L 20 mEq (08/06/17 1155)    PRN Meds: acetaminophen **OR** acetaminophen, senna-docusate  Physical Exam        Cachectic male with temporal wasting, awake, conversant, will drift off on a tangent.  Difficult to understand. CV rrr with murmur resp no distress Abdomen thin, nt, nd  Vital Signs: BP (!) 142/81 (BP Location: Left Arm)   Pulse 71   Temp 97.7 F (36.5 C) (Oral)   Resp 17   Ht '5\' 10"'$  (1.778 m)   Wt 54.2 kg (119 lb 8 oz)   SpO2 100%   BMI 17.15 kg/m  SpO2: SpO2: 100 % O2 Device: O2 Device: Not Delivered O2 Flow Rate: O2 Flow Rate (L/min): 2 L/min  Intake/output summary:  Intake/Output Summary (Last 24 hours) at 08/06/17 1247 Last data filed at 08/06/17 1024  Gross per 24 hour  Intake                0 ml  Output             2700 ml  Net            -2700 ml   LBM: Last BM Date: 08/02/17 Baseline Weight: Weight: 53.5 kg (117 lb 15.1 oz) Most  recent weight: Weight: 54.2 kg (119 lb 8 oz)       Palliative Assessment/Data:    Flowsheet Rows     Most Recent Value  Intake Tab  Referral Department  Hospitalist  Unit at Time of Referral  Med/Surg Unit  Palliative Care Primary Diagnosis  Other (Comment)  Date Notified  08/04/17  Palliative Care Type  New Palliative care  Reason for referral  Clarify Goals of Care  Date of Admission  08/02/17  Date first seen by Palliative Care  08/05/17  # of days Palliative referral response time  1 Day(s)  # of days IP prior to Palliative referral  2  Clinical Assessment  Palliative Performance Scale Score  30%  Pain Max last 24 hours  Not able to report  Pain Min Last 24 hours  Not able to report  Dyspnea Max Last 24 Hours  Not able to report  Dyspnea Min Last 24 hours  Not able to report  Nausea Max Last 24 Hours  Not able to report  Nausea Min Last 24 Hours  Not able to report  Anxiety Max Last 24 Hours  Not able to report  Anxiety Min Last 24 Hours  Not able to report  Other Max Last 24 Hours  Not able to report  Psychosocial & Spiritual Assessment  Palliative Care Outcomes  Patient/Family meeting held?  Yes  Who was at the meeting?  pt, wife  Palliative Care follow-up planned  Yes, Facility      Patient Active Problem List   Diagnosis Date Noted  . Palliative care by specialist   . Thrombocytopenia ([place])   . DIC (disseminated intravascular  coagulation) ([place]) 08/03/2017  . Melena   . Acute blood loss anemia   . Pancytopenia ([place]) 08/02/2017  . Splenomegaly 08/02/2017  . Hyperlipemia 08/02/2017  . Legally blind 08/02/2017  . CKD (chronic kidney disease) 08/02/2017  . Debility 08/02/2017  . Parkinsonism ([place]) 08/02/2017  . GIB (gastrointestinal bleeding) 08/02/2017  . Symptomatic anemia 08/02/2017  . Combined forms of age-related cataract of left eye 11/04/2015  . TIA (transient ischemic attack) 01/09/2012  . Slurred speech 01/08/2012  . Hypertension 01/08/2012     Palliative Care Plan    Recommendations/Plan:  Full comfort.  Will discontinue interventions not related to comfort.  Haldol for anxiety as well as delirium as he has severe hallucinations with benzodiazepines  CSW referral to hospice house.  Family requests that wife be allowed to stay with him 24/7  Goals of Care and Additional Recommendations:  Limitations on Scope of Treatment: Full Comfort Care  Code Status:  DNR  Prognosis:   < 2 weeks due to minimal to no PO intake, GI bleed, ongoing encephalopathy, DIC, weight loss   Discharge Planning:  [place] was discussed with family, Dr. Alfredia Ferguson.  Thank you for allowing the Palliative Medicine Team to assist in the care of this patient.  Total time spent:  45 min.     Greater than 50%  of this time was spent counseling and coordinating care related to the above assessment and plan.  Florentina Jenny, PA-C Palliative Medicine  Please contact Palliative MedicineTeam phone at [phone] for questions and concerns between 7 am - 7 pm.   Please see AMION for individual provider pager numbers.

## 2017-08-06 NOTE — Progress Notes (Signed)
PROGRESS NOTE    Kyle Espinoza  OXB:[id number] DOB: [birth year] DOA: 08/02/2017 PCP: Deland Pretty, MD   Brief Narrative:  Kyle Espinoza is a 71 y.o. 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 Alen Blew, last seen in April of this year, at which time he was found to have stable counts. He was brought to the ED, after his wife found him to have increased confusion, generalized weakness, and 1 episode of what melanotic stools. She describes it as one episode of severe diarrhea without further events. This follows the recent teeth extraction. Wife reports that at the time no significant bleeding was seen.  No apparent fever, chills, chest pain or palpitations.  She denied the patient having any increase shortness of breath, or cough. No apparent sick contacts. No increased lower extremity swelling.   Has significant FTT with a 20 lb weight loss over the last 2 months. Of note, the patient has known intentional tremors, but she reports that he has a new tremor on his lips, for the last week. He is incontinent of urine, but she denies the patient having any hematuria. He was admitted for confusion and dark melanotic stools and is currently being worked up. Gastroenterology consulted and patient underwent EGD and MRI of the Abdomen showed no malignancy.   Labs Indicate that patient has DIC so Hematology Dr. Burr Medico was consulted for further evalation and recommendation. For now she advised q8H CBC's and Supportive Care.   Dr. Burr Medico recommended patient get a CT of the Chest to evaluate for malignancy but that showed scattered ground glass opacities in both lower lobes. Patient went for a SLP and was made NPO due to high risk of  aspiration. During the SLP evaluation a prominent tissue mass was found and a CT of the Neck has been ordered to better evaluate. CT of the neck showed a small Pharyngocele but no mass was identified. Palliative Care medicine has also been consulted for Goals of Care. I discussed that I thought the patient was nearing End of Life and family agreeable to Hospice and wanting to take Patient home. Palliative Care met with the patient and family and patient was transitioned to [place] and after discussion with palliative Patient's family wanting to take him to [facility]. Patient is at high risk for decompensation given his Multiple medical comorbidities and Acute illnesses.    Assessment & Plan:   Principal Problem:   GIB (gastrointestinal bleeding) Active Problems:   Hypertension   TIA (transient ischemic attack)   Pancytopenia (H[facility])   Splenomegaly   Hyperlipemia   Legally blind   CKD (chronic kidney disease)   Debility   Parkinsonism (H[facility])   Symptomatic anemia   Melena   Acute blood loss anemia   DIC (disseminated intravascular coagulation) ([facility])   Thrombocytopenia ([place])   Palliative care by specialist   Acute encephalopathy  Melena and Symptomatic Anemia likely from Dental Procedure and Bleeding Gums in the setting of DIC, improved after Blood Transfusion -Visible dark stools, Hcult positive at the ED   -IV Protonix drip was started by EDP and changed to po Protonix by Gastroenterology -NPO until GI eval then advance diet; SLP consulted for evaluation and recommend NPO given high Risk of Aspiration -Hold NSAIDs/ ASA -Anemia  panel as Below -CT A/P with oral contrast r/o colitis or bleeding sources not done this time and was done recently as GI wanted MRI of Abdomen to further evaluate Mass on Kidney which showed no evidence of malignancy -Will not pursue further work up as patient is now [place] with the goal to transition to [facility]. All medications stopped except  for the patients comfort including Robinul, Haldol (Avoid Benzodiazepines for Anxiety given Hallucinations), Morphine and PPI BID.   DIC in the setting of known splenomegaly, and current bleeding issues, with gingival bleeding and significant bruising  -Checked Initial DIC panel; D-Dimer was >20.00 and Fibrinogen was 136; Repeat showed D-Dimer >20.00 and Fibrinogen was 64; Smear showed Schistocytes -PT was 17.0 and INR was 1.39; APTT was 35 -Smear Schistocytes  -C/w IVF Rehydration -Discussed Case with Dr. Burr Medico of Hematology and for now continue supportive care and Monitor CBC's q8h and provide Blood Transfusions as Necessary; Dr. Burr Medico now recommending to change CBC's back to daily now that labs are improving  -Appreciate Dr. Ernestina Penna evaluation of the Patient  -D/C'd Unasyn for Infection Coverage  -Chest CT negative for malignancy however patient had Swallow Screen which showed mass in Neck. Checked CT of Neck and it was negative Dr. Burr Medico Recommending Bone Marrow Biopsy to be done by IR; Family does not want patient to under go Biopsy -Family and Patient transitioning to [place] and referral made for [facility]  Anemia of chronic disease and ABLA s/p Transfusion of 2 units of pRBCs -Hemoglobin on admission 7.9 Hcult + ; Hb/Hct Trended down to 6.3/19.1 so patient was transfused 2 units of pRBCs and Hb/Hct improved to 9.6/29.1 and now trended back down to 8.7/25.9 yesterday. Increased Hb/Hct today at 9.6/28.4 -Anemia Panel showed Iron of 29, UIBC of 161, TIBC of 190, Saturation Ratios of 15, Ferritin of 735, Folate of 21.9 and Vitamin B12 of 3,381 -Gastroenterology Consulted and patient underwent EGD -Continue to Monitor for S/Sx of Bleeding  -Iron Supplementation with Ferrous Sulfate 325 mg po BID now D/C'd -Will not repeat CBC   Pancytopenia -WBC was 2.4, Hb/Hct was 8.5/26.2, and Platelet Count was 60  -Now WBC is 3.9, Hb/Hct is 9.6/28.4 and Platelet Count is 90 -Will not Repeat CBC  in AM -As above  Acute encephalopathy, improved per Wife appears Baseline -Etiology unclear. UA was negative.  -WBC normal on admission and now decreased -Afebrile. Prior h/o TIA  -CT head r/o masses or bleeding showed Senescent changes without acute or reversible finding. -Vitamin B12 was normal as well as Ammonia Level -C/w IVF Rehydration; Changed from NS to D5W + KCl given patient's NPO status -Unasyn for infection coverage D/C'd  AKI on CKD Stage 2 -Improved with IVF Rehydration and Blood Administration -BUN/Cr went from 29/1.24 -> 21/1.11 -> 15/1.21 -> 10/1.06 -> 5/0.99 -Will not check CMP  Hypertension -Hhome anti-hypertensive medications D/C'd now that patient is Comfort  Hyperlipidemia -Held home Atorvastatin 10 mg po Daily as patient is NPO but now D/C'd that patient is Comfort Care  Hypothyroidism: -Change home Synthroid to IV but now will D/C Synthroid now that patient is Comfort Care -Checked TSH and was 1.351  Dementia; Parkinsons  -Not on meds at this time   Dysphagia with Mass noted on Swallow Eval -Was STRICT NPO and considered Cortrak for Nutrition/Hydration but would be hesitant to put one in given DIC; Will allow patient to eat now that he is [place] and wants Pleasure Feedings -Check CT of Neck  showed No pharyngeal abnormality identified aside from a possible small right pharyngocele at the hypopharynx. Small volume retained barium in the oral cavity, pharynx, and visible esophagus. No neck mass or lymphadenopathy identified in the absence of IV contrast. Previous C4 through C7 level ACDF with solid appearing arthrodesis. -CT of Neck was Negative so Bone Marrow Biopsy was recommended but Family refusing -Palliative Care consulted for further Goals of Care and if patient is transitioned to Hospice/Comfort Care will allow Comfort feedings knowing that patient will aspirate.   DVT prophylaxis: SCDs Code Status: DO NOT RESUSCITATE Family Communication:  Discussed with Family at bedside Disposition Plan: Residential Hospice in AM if bed Available   Consultants:   Gastroenterology Dr. Hilarie Fredrickson   Hematology Dr. Burr Medico  Palliative Care Medicine   Procedures:  EGD Findings:      The examined esophagus was normal.      The entire examined stomach was normal.      A large non-bleeding diverticulum was found in the area of the papilla.      The exam of the duodenum was otherwise normal. Impression:               - Old, dried blood in the mouth without active                            bleeding (recent gingival bleeding treated by                            endodontist).                           - Normal esophagus.                           - Normal stomach.                           - Non-bleeding duodenal diverticulum.                           - Otherwise normal duodenum.                           - No evidence of upper GI bleeding.                           - No specimens collected.   Antimicrobials:  Anti-infectives    Start     Dose/Rate Route Frequency Ordered Stop   08/02/17 1300  Ampicillin-Sulbactam (UNASYN) 3 g in sodium chloride 0.9 % 100 mL IVPB     3 g 200 mL/hr over 30 Minutes Intravenous Every 8 hours 08/02/17 1230       Subjective: Seen and examined at bedside and patient and family had come to a decision to transition to comfort. Wanting to Eat. No other complaints or concerns.   Objective: Vitals:   08/05/17 2058 08/06/17 0143 08/06/17 0520 08/06/17 1023  BP: 133/66 128/65 (!) 142/66 (!) 142/81  Pulse: 74 (!) 58 76 71  Resp: _0 Temp:  98.3 F (36.8 C) 97.7 F (36.5 C) 97.7 F (36.5 C)  TempSrc:  Axillary Oral Oral  SpO2: 98% 100%  100% 100%  Weight:      Height:        Intake/Output Summary (Last 24 hours) at 08/06/17 1411 Last data filed at 08/06/17 1024  Gross per 24 hour  Intake                0 ml  Output             2700 ml  Net            -2700 ml   Filed Weights   08/02/17 1200  08/02/17 1642 08/05/17 0500  Weight: 53.5 kg (117 lb 15.1 oz) 51.1 kg (112 lb 11.2 oz) 54.2 kg (119 lb 8 oz)   Examination: Physical Exam:  Constitutional: Thin Cachectic Caucasian Male in NAD who is at times incomprhensible Eyes: Pupils symmetric ENMT: External Ears appear normal. Grossly normal hearing if you speak close near his ears; Had dry blood in mouth Neck: No JVD. No visible cervical lymphadenopathy Respiratory: Diminished but no appreciable wheezing/rales/rhonchi. Patient was not tachypenic or using any accessory muscles to breathe Cardiovascular: RRR; No extremity edema Abdomen: Soft, NT, ND. Bowel sounds present GU: Deferred Musculoskeletal: No contractures; No cyanosis Skin: Had bruising and skin was warm and dry Neurologic: Incomprehensible at times but able to understand. No appreciable focal deficits Psychiatric: Awake, Normal appearing mood.   Data Reviewed: I have personally reviewed following labs and imaging studies  CBC:  Recent Labs Lab 08/03/17 2032 08/04/17 0630 08/04/17 1439 08/05/17 0623 08/06/17 0447  WBC 3.9* 3.2* 4.3 2.7* 3.9*  NEUTROABS 2.8 2.2 3.1 1.6* 2.7  HGB 8.9* 9.0* 9.6* 8.7* 9.6*  HCT 26.9* 26.8* 29.1* 25.9* 28.4*  MCV 85.9 85.4 85.3 84.6 84.3  PLT 69* 65* 81* 69*  72* 87*  90*   Basic Metabolic Panel:  Recent Labs Lab 08/03/17 0013 08/03/17 0939 08/04/17 0630 08/05/17 0623 08/06/17 0447  NA 140 143 145 142 137  K 3.5 3.7 3.7 3.7 3.6  CL 111 111 113* 112* 107  CO2 21* 21* 21* 22 23  GLUCOSE 99 90 74 108* 116*  BUN 29* 21* 15 10 5*  CREATININE 1.24 1.11 1.21 1.06 0.99  CALCIUM 8.2* 8.4* 8.5* 8.4* 8.4*  MG  --  2.1 2.0 1.9 1.7  PHOS  --  3.1 3.6 2.7 2.5   GFR: Estimated Creatinine Clearance: 51.7 mL/min (by C-G formula based on SCr of 0.99 mg/dL). Liver Function Tests:  Recent Labs Lab 08/03/17 0013 08/03/17 0939 08/04/17 0630 08/05/17 0623 08/06/17 0447  AST _0 ALT 16* 17 18 16* 15*  ALKPHOS 38  41 40 38 41  BILITOT 0.7 0.6 1.0 0.6 0.6  PROT 4.8* 5.3* 5.1* 5.1* 5.3*  ALBUMIN 3.0* 3.2* 3.0* 2.9* 3.2*   No results for input(s): LIPASE, AMYLASE in the last 168 hours.  Recent Labs Lab 08/02/17 1154  AMMONIA 10   Coagulation Profile:  Recent Labs Lab 08/02/17 1154 08/03/17 0013 08/04/17 0630 08/05/17 0623 08/06/17 0447  INR 1.32  1.27 1.39 1.47 1.54 1.46   Cardiac Enzymes:  Recent Labs Lab 08/02/17 1154  TROPONINI <0.03   BNP (last 3 results) No results for input(s): PROBNP in the last 8760 hours. HbA1C: No results for input(s): HGBA1C in the last 72 hours. CBG:  Recent Labs Lab 08/05/17 2015 08/06/17 0150 08/06/17 0432 08/06/17 0802 08/06/17 1126  GLUCAP 119* 89 104* 118* 106*   Lipid Profile: No results for input(s): CHOL, HDL, LDLCALC, TRIG, CHOLHDL, LDLDIRECT in  the last 72 hours. Thyroid Function Tests: No results for input(s): TSH, T4TOTAL, FREET4, T3FREE, THYROIDAB in the last 72 hours. Anemia Panel: No results for input(s): VITAMINB12, FOLATE, FERRITIN, TIBC, IRON, RETICCTPCT in the last 72 hours. Sepsis Labs: No results for input(s): PROCALCITON, LATICACIDVEN in the last 168 hours.  Recent Results (from the past 240 hour(s))  Culture, blood (Routine X 2) w Reflex to ID Panel     Status: None (Preliminary result)   Collection Time: 08/02/17  5:08 PM  Result Value Ref Range Status   Specimen Description BLOOD RIGHT ARM  Final   Special Requests IN PEDIATRIC BOTTLE Blood Culture adequate volume  Final   Culture NO GROWTH 4 DAYS  Final   Report Status PENDING  Incomplete  Culture, blood (Routine X 2) w Reflex to ID Panel     Status: None (Preliminary result)   Collection Time: 08/02/17  5:27 PM  Result Value Ref Range Status   Specimen Description BLOOD RIGHT ARM  Final   Special Requests IN PEDIATRIC BOTTLE Blood Culture adequate volume  Final   Culture NO GROWTH 4 DAYS  Final   Report Status PENDING  Incomplete    Radiology  Studies: Ct Soft Tissue Neck Wo Contrast  Result Date: 08/04/2017 CLINICAL DATA:  71 year old male with abnormal modified barium swallow. Possible neck or pharyngeal mass. EXAM: CT NECK WITHOUT CONTRAST TECHNIQUE: Multidetector CT imaging of the neck was performed following the standard protocol without intravenous contrast. COMPARISON:  Modified barium swallow images 1223 hours today. Chest CT 1241 hours today. Head CT without contrast 08/02/2017. FINDINGS: Pharynx and larynx: Laryngeal soft tissue contours are within normal limits. There is a small volume of retained barium in the hypopharynx including the vallecula and piriform sinuses. There is asymmetric caudal extension of this retained barium at the right hypopharynx which could indicate a small pharyngocele(coronal image 43). Oropharynx contours are normal. Negative nasopharynx. Small volume of retained barium in the oral cavity. Negative parapharyngeal and retropharyngeal spaces. Salivary glands: Negative noncontrast sublingual space, submandibular glands and parotid glands. Thyroid: Diminutive, negative. Lymph nodes: No lymphadenopathy identified. Vascular: Vascular patency is not evaluated in the absence of IV contrast. Mild for age carotid bifurcation calcified plaque. Mild distal vertebral artery calcified plaque. Limited intracranial: Negative. Visualized orbits: Negative. Mastoids and visualized paranasal sinuses: Visualized paranasal sinuses and mastoids are stable and well pneumatized. Skeleton: Previous C4 through C7 level ACDF. Solid-appearing arthrodesis. Hardware remains in place. There is mild anterior protrusion of the C7 cortical screws, more so the left, but no evidence of hardware loosening at that level. Mild anterolisthesis of C7 on T1 associated with facet hypertrophy. No acute osseous abnormality identified. Upper chest: Chest findings today are reported separately. Small volume retained barium in the visible esophagus. IMPRESSION: 1.  No pharyngeal abnormality identified aside from a possible small right pharyngocele at the hypopharynx. Small volume retained barium in the oral cavity, pharynx, and visible esophagus. 2. No neck mass or lymphadenopathy identified in the absence of IV contrast. 3. Previous C4 through C7 level ACDF with solid appearing arthrodesis. 4. Chest CT today reported separately. Electronically Signed   By: Genevie Ann M.D.   On: 08/04/2017 18:51   Scheduled Meds: . feeding supplement  1 Container Oral TID BM  . pantoprazole (PROTONIX) IV  40 mg Intravenous Q12H  . sodium chloride flush  3 mL Intravenous Q12H   Continuous Infusions: . sodium chloride    . ampicillin-sulbactam (UNASYN) IV 3 g (08/06/17 1334)  LOS: 4 days   Kerney Elbe, DO [facility] Pager [phone]  If 7PM-7AM, please contact night-coverage [email or web address] Password [email or web address] 08/06/2017, 2:11 PM

## 2017-08-06 NOTE — Care Management Important Message (Signed)
Important Message  Patient Details  Name: Kyle Espinoza MRN: [id number] Date of Birth: 08-Feb-1946   Medicare Important Message Given:       Orbie Pyo 08/06/2017, 11:19 AM

## 2017-08-07 LAB — CULTURE, BLOOD (ROUTINE X 2)
CULTURE: NO GROWTH
Culture: NO GROWTH
SPECIAL REQUESTS: ADEQUATE
SPECIAL REQUESTS: ADEQUATE

## 2017-08-07 MED ORDER — GLYCOPYRROLATE 1 MG PO TABS: 1.0000 mg | ORAL_TABLET | ORAL | 0 refills | Status: AC | PRN

## 2017-08-07 MED ORDER — BIOTENE DRY MOUTH MT LIQD: 15.0000 mL | OROMUCOSAL | Status: AC | PRN

## 2017-08-07 MED ORDER — HALOPERIDOL 0.5 MG PO TABS: 0.5000 mg | ORAL_TABLET | ORAL | 0 refills | Status: AC | PRN

## 2017-08-07 MED ORDER — ACETAMINOPHEN 325 MG PO TABS: 650.0000 mg | ORAL_TABLET | Freq: Four times a day (QID) | ORAL | 0 refills | Status: AC | PRN

## 2017-08-07 MED ORDER — POLYVINYL ALCOHOL 1.4 % OP SOLN: 1.0000 [drp] | Freq: Four times a day (QID) | OPHTHALMIC | 0 refills | Status: AC | PRN

## 2017-08-07 MED ORDER — MORPHINE SULFATE (CONCENTRATE) 10 MG/0.5ML PO SOLN: 5.0000 mg | ORAL | 0 refills | Status: AC | PRN

## 2017-08-07 MED ORDER — BOOST / RESOURCE BREEZE PO LIQD: 1.0000 | Freq: Three times a day (TID) | ORAL | 0 refills | Status: AC

## 2017-08-07 NOTE — Clinical Social Work Note (Signed)
Clinical Social Work Assessment  Patient Details  Name: Kyle Espinoza MRN: [id number] Date of Birth: 1946-07-01  Date of referral:  08/07/17               Reason for consult:  Facility Placement, Discharge Planning, End of Life/Hospice                Permission sought to share information with:  Facility Sport and exercise psychologist, Family Supports Permission granted to share information::  Yes, Verbal Permission Granted  Name::     [facility]::  Residential hospice  Relationship::  Spouse  Contact Information:     Housing/Transportation Living arrangements for the past 2 months:  [place] of Information:  Patient, Spouse Patient Interpreter Needed:  None Criminal Activity/Legal Involvement Pertinent to Current Situation/Hospitalization:  No - Comment as needed Significant Relationships:  Adult Children, Spouse, Other Family Members Lives with:  [name], Spouse Do you feel safe going back to the place where you live?  Yes Need for family participation in patient care:  Yes (Comment) (patient is terminally ill)  Care giving concerns:  Patient is currently at end of life, and seeking residential hospice placement.   Social Worker assessment / plan:  CSW met with patient and patient's wife, Kyle Espinoza, at bedside to discuss discharge plan and referral for residential hospice home. CSW confirmed hospice of choice with patient's wife, being [facility]. CSW contacted hospice representative to provide referral and determine bed availability. Representative from [place] will be available to meet with the family at 12:15 PM today to discuss admission and answer questions. CSW provided information to patient and patient's wife. CSW will follow to facilitate discharge.  Employment status:  Retired Nurse, adult PT Recommendations:  [place] / Referral to community resources:  Other (Comment Required)  (Residential hospice)  Patient/Family's Response to care:  Patient and patient's wife agreeable to residential hospice placement.  Patient/Family's Understanding of and Emotional Response to Diagnosis, Current Treatment, and Prognosis:  Patient and patient's spouse seem to be at peace with the decision to seek residential hospice, even though they are upset that it is happening. Patient's wife discussed how she knew that they would ultimately end up in this situation, because they only got married about 10 years ago, but that she really wished they would have had more time together. Patient's wife had brought in some of the patient's favorite gospel cassettes and had them playing for the patient in the background to try to keep him comfortable. Patient attempted to communicate some things to both patient's wife and to CSW, but was difficult to understand at times. Patient and patient's wife indicated understanding of CSW role and appreciated assistance in coordinating discharge.  Emotional Assessment Appearance:  Appears stated age Attitude/Demeanor/Rapport:    Affect (typically observed):  Appropriate Orientation:  Oriented to Line, Oriented to Place, Oriented to  Time, Oriented to Situation Alcohol / Substance use:  Not Applicable Psych involvement (Current and /or in the community):  No (Comment)  Discharge Needs  Concerns to be addressed:  Care Coordination Readmission within the last 30 days:  No Current discharge risk:  Physical Impairment, Terminally ill Barriers to Discharge:  Hospice Bed not available, [place], LCSW 08/07/2017, 11:42 AM

## 2017-08-07 NOTE — Progress Notes (Signed)
Daily Progress Note   Patient Name: Kyle Espinoza       Date: 08/07/2017 DOB: 26-Jul-1946  Age: 71 y.o. MRN#: [id number] Attending Physician: Kerney Elbe, DO Primary Care Physician: Deland Pretty, MD Admit Date: 08/02/2017  Reason for Consultation/Follow-up: Establishing goals of care  Subjective: Spent time with the patient and his wife at bedside.  His discomfort was able to be relieved with repositioning.  He is difficult to understand but told me he wants to marry Kyle Espinoza - his love of over 40 years.  The couple plans to get a license today or tomorrow and request a minister as soon as he arrives at hospice.  It is important to Kyle Espinoza that he and Kyle Espinoza be married.   He talked about how they first met and his concerns for her welfare after he is gone.  Assessment:  Patient mildy confused.  He is quite ill and unable to take any PO nutrition.  Pleasure feeds requested by family.  He is at high risk for bleeding given DIC.   Patient Profile/HPI:  71 y.o.malewith past medical history of Chronic kidney disease stage II, left eye blindness, TIA in 2013, hyperlipidemia, hypertension, hypothyroidism, impaired balance, osteoarthritis, history of Parkinson's followed by neurology history of splenomegaly, history of pancytopenia followed by Dr. Neville Route seen in April 2018)admitted on 8/30/2018with increased confusion, generalized weakness and an episode of melenic stools. This follows recent teeth extraction that spouse reports bled profusely.  He reports a failure to thrive picture with a 20 pound weight loss over a two-month period, loss of appetite. He has become incontinent of urine. Gastroenterology was consulted and patient underwent an EGD, MRI of the abdomen, which showed no malignancy.  Labs indicate the patient has DIC so hematology was consulted. CT of the chest was obtained to evaluate further for malignancy as underlying cause of DIC, which showed scattered groundglass opacities in both lower lobes. Patient went for a SLP which showed a prominent tissue mass. He is nothing by mouth secondary to high risk of aspiration.   Length of Stay: 5  Current Medications: Scheduled Meds:  . feeding supplement  1 Container Oral TID BM  . pantoprazole (PROTONIX) IV  40 mg Intravenous Q12H  . sodium chloride flush  3 mL Intravenous Q12H    Continuous Infusions: . sodium chloride  PRN Meds: sodium chloride, acetaminophen **OR** acetaminophen, antiseptic oral rinse, glycopyrrolate **OR** glycopyrrolate **OR** glycopyrrolate, haloperidol **OR** haloperidol **OR** haloperidol lactate, morphine CONCENTRATE **OR** morphine CONCENTRATE, polyvinyl alcohol, sodium chloride flush  Physical Exam        Cachectic frail man.  Slightly diaphoretic, but NAD and pleasant to speak with Head temporal wasting, poor dentition CV rrr resp no distress Abdomen nt, nd  Vital Signs: BP (!) 147/71 (BP Location: Left Arm)   Pulse 88   Temp (!) 97.5 F (36.4 C) (Axillary)   Resp 20   Ht '5\' 10"'$  (1.778 m)   Wt 54.2 kg (119 lb 8 oz)   SpO2 99%   BMI 17.15 kg/m  SpO2: SpO2: 99 % O2 Device: O2 Device: Not Delivered O2 Flow Rate: O2 Flow Rate (L/min): 2 L/min  Intake/output summary:  Intake/Output Summary (Last 24 hours) at 08/07/17 1228 Last data filed at 08/07/17 1100  Gross per 24 hour  Intake               60 ml  Output              600 ml  Net             -540 ml   LBM: Last BM Date: 08/02/17 Baseline Weight: Weight: 53.5 kg (117 lb 15.1 oz) Most recent weight: Weight: 54.2 kg (119 lb 8 oz)       Palliative Assessment/Data:    Flowsheet Rows     Most Recent Value  Intake Tab  Referral Department  Hospitalist  Unit at Time of Referral  Med/Surg Unit  Palliative Care Primary  Diagnosis  Other (Comment)  Date Notified  08/04/17  Palliative Care Type  New Palliative care  Reason for referral  Clarify Goals of Care  Date of Admission  08/02/17  Date first seen by Palliative Care  08/05/17  # of days Palliative referral response time  1 Day(s)  # of days IP prior to Palliative referral  2  Clinical Assessment  Palliative Performance Scale Score  30%  Pain Max last 24 hours  Not able to report  Pain Min Last 24 hours  Not able to report  Dyspnea Max Last 24 Hours  Not able to report  Dyspnea Min Last 24 hours  Not able to report  Nausea Max Last 24 Hours  Not able to report  Nausea Min Last 24 Hours  Not able to report  Anxiety Max Last 24 Hours  Not able to report  Anxiety Min Last 24 Hours  Not able to report  Other Max Last 24 Hours  Not able to report  Psychosocial & Spiritual Assessment  Palliative Care Outcomes  Patient/Family meeting held?  Yes  Who was at the meeting?  pt, wife  Palliative Care follow-up planned  Yes, Facility      Patient Active Problem List   Diagnosis Date Noted  . Acute encephalopathy   . Palliative care by specialist   . Thrombocytopenia ([place])   . DIC (disseminated intravascular coagulation) ([place]) 08/03/2017  . Melena   . Acute blood loss anemia   . Pancytopenia ([place]) 08/02/2017  . Splenomegaly 08/02/2017  . Hyperlipemia 08/02/2017  . Legally blind 08/02/2017  . CKD (chronic kidney disease) 08/02/2017  . Debility 08/02/2017  . Parkinsonism ([place]) 08/02/2017  . GIB (gastrointestinal bleeding) 08/02/2017  . Symptomatic anemia 08/02/2017  . Combined forms of age-related cataract of left eye 11/04/2015  . TIA (transient ischemic attack) 01/09/2012  .  Slurred speech 01/08/2012  . Hypertension 01/08/2012    Palliative Care Plan    Recommendations/Plan:  D/C to hospice when bed is available  Antibiotics d/c'd.  Patient only on comfort meds and protonix (given recent bleeding)   Goals of Care and Additional  Recommendations:  Limitations on Scope of Treatment: Full Comfort Care  Code Status:  DNR  Prognosis:   < 2 weeks rapid decline, wt loss, near zero PO intake, DIC, gi bleeding.  Discharge Planning:  Hospice facility  Care plan was discussed with CSW, bedside RN, Palliative Team  Thank you for allowing the Palliative Medicine Team to assist in the care of this patient.  Total time spent:  35 min     Greater than 50%  of this time was spent counseling and coordinating care related to the above assessment and plan.  Florentina Jenny, PA-C Palliative Medicine  Please contact Palliative MedicineTeam phone at [phone] for questions and concerns between 7 am - 7 pm.   Please see AMION for individual provider pager numbers.

## 2017-08-07 NOTE — Care Management Note (Signed)
Case Management Note  Patient Details  Name: Kyle Espinoza MRN: [id number] Date of Birth: January 10, 1946  Subjective/Objective:                    Action/Plan: Plan is for patient to d/c to residential hospice today. No further needs per CM.   Expected Discharge Date:                  Expected Discharge Plan:  [place]  In-House Referral:  Clinical Social Work  Discharge planning Services     Post Acute Care Choice:    Choice offered to:     DME Arranged:    DME Agency:     HH Arranged:    [place] Agency:     Status of Service:  Completed, signed off  If discussed at [employer], dates discussed:    Additional Comments:  Pollie Friar, RN 08/07/2017, 11:50 AM

## 2017-08-07 NOTE — Consult Note (Signed)
[place]:  Met with pt and his girlfriend/ spouse.  Reviewed hospice service agreement and the pt's goals of care. They are in agreement to hospice care. Hospice directory Dr. Ander Purpura approved pt to come and they have agreed to accept the bed at [facility] that we offered. Webb Silversmith RN [phone]

## 2017-08-07 NOTE — Discharge Summary (Signed)
Physician Discharge Summary  Kyle Espinoza QIH:[id number] DOB: [birth year] DOA: 08/02/2017  PCP: Deland Pretty, MD  Admit date: 08/02/2017 Discharge date: 08/07/2017  Admitted From: Home Disposition:  [facility]   Recommendations for Outpatient Follow-up:  1. Follow up Care per Hospice Protocol   Home Health: No  Equipment/Devices: None    Discharge Condition: Comfort Measures CODE STATUS: DO NOT RESUSCITATE  Diet recommendation: Heart Healthy Diet for Pleasure Feedings  Brief/Interim Summary: Kyle Espinoza a 71 y.o.maleWith extensive medical history including CKD stage 2, left eye blindness, history of TIA in 2013, hyperlipidemia, hypertension, hypothyroidism, impairment of balance, osteoarthritis, history of parkinsonism followed by neurology, history of splenomegaly, ?Ureteral tumor on the left, and a history of pancytopenia followed by Dr. Alen Blew, last seen in April of this year, at which time he was found to have stable counts. He was brought to the ED, after his wife found him tohave increased confusion, generalized weakness, and 1 episode of what melanotic stools. She describes it as one episode of severe diarrhea without further events. This follows the recent teeth extraction. Wife reports that at the time no significant bleeding was seen. No apparent fever, chills,chest pain or palpitations. She denied the patient having any increase shortness of breath, or cough. No apparent sickcontacts. No increased lower extremityswelling.   Has significant FTT with a 20 lb weight loss over the last 2 months. Of note, the patient has known intentionaltremors, but she reports that he has a new tremor on his lips, for the last week. He is incontinent of urine, but she denies the patient having any hematuria. He was admitted for confusion and dark melanotic stools and is currently being worked up. Gastroenterology consulted and patient underwent EGD and MRI of the Abdomen showed no  malignancy.   Labs Indicated that patient has DIC so Hematology Dr. Burr Medico was consulted for further evalation and recommendation. Dr. Burr Medico recommended patient get a CT of the Chest to evaluate for malignancy but that showed scattered ground glass opacities in both lower lobes. Patient went for a SLP and was made NPO due to high risk of aspiration. During the SLP evaluation a prominent tissue mass was found and a CT of the Neck has been ordered to better evaluate. CT of the neck was ordered and showed a small Pharyngocele but no mass was identified.  Palliative Care medicine has also been consulted for Goals of Care. I discussed that I thought the patient was nearing End of Life and family agreeable to Hospice and wanting to take Patient home. Palliative Care met with the patient and family and patient was transitioned to [place] and after discussion with palliative Patient's family wanting to take him to [facility]idential [facility]. Patient is at high risk for decompensation given his Multiple medical comorbidities and Acute illnesses and now is Comfort Measures.    Discharge Diagnoses:  Principal Problem:   GIB (gastrointestinal bleeding) Active Problems:   Hypertension   TIA (transient ischemic attack)   Pancytopenia (HCC)   Splenomegaly   Hyperlipemia   Legally blind   CKD (chronic kidney disease)   Debility   Parkinsonism (HCC)   Symptomatic anemia   Melena   Acute blood loss anemia   DIC (disseminated intravascular coagulation) ([place])   Thrombocytopenia ([place])   Palliative care by specialist   Acute encephalopathy  Melena and Symptomatic Anemia likely from Dental Procedure and Bleeding Gums in the setting of DIC, improved after Blood Transfusion -Visible dark stools, Hcult positive at the  ED  -IV Protonix drip was started by EDP and changed to po Protonix by Gastroenterology -NPO until GI eval then advance diet; SLP consulted for evaluation and recommend NPO given high Risk of  Aspiration -Hold NSAIDs/ ASA -Anemia panel as Below -CT A/P with oral contrast r/o colitis or bleeding sources not done this time and was done recently as GI wanted MRI of Abdomen to further evaluate Mass on Kidney which showed no evidence of malignancy -Will not pursue further work up as patient is now [place] with the goal to transition to [facility]. All medications stopped except for the patients comfort including Robinul, Haldol (Avoid Benzodiazepines for Anxiety given Hallucinations), Morphine and PPI BID.  -Further Care per Hospice Protocol  DIC in the setting of known splenomegaly, and current bleeding issues, with gingival bleeding and significant bruising  -Checked Initial DIC panel; D-Dimer was >20.00 and Fibrinogen was 136; Repeat showed D-Dimer >20.00 and Fibrinogen was 64; Smear showed Schistocytes -PT was 17.0 and INR was 1.39; APTT was 35 -Smear Schistocytes  -C/w IVF Rehydration -Discussed Case with Dr. Burr Medico of Hematology and for now continue supportive care and Monitor CBC's q8h and provide Blood Transfusions as Necessary; Dr. Burr Medico now recommending to change CBC's back to daily now that labs are improving  -Appreciate Dr. Ernestina Penna evaluation of the Patient  -D/C'd Unasyn for Infection Coverage  -Chest CT negative for malignancy however patient had Swallow Screen which showed mass in Neck. Checked CT of Neck and it was negative Dr. Burr Medico Recommending Bone Marrow Biopsy to be done by IR; Family does not want patient to under go Biopsy -Family and Patient transitioning to [place] and will D/C to [facility] [place]  Anemia of chronic disease and ABLA s/p Transfusion of 2 units of pRBCs -Hemoglobin on admission 7.9 Hcult + ; Hb/Hct Trended down to 6.3/19.1 so patient was transfused 2 units of pRBCs and Hb/Hct improved to 9.6/29.1 and now trended back down to 8.7/25.9 yesterday. Increased Hb/Hct yesterday at 9.6/28.4 -Anemia Panel showed Iron of 29, UIBC of 161,  TIBC of 190, Saturation Ratios of 15, Ferritin of 735, Folate of 21.9 and Vitamin B12 of 3,381 -Gastroenterology Consulted and patient underwent EGD -Continue to Monitor for S/Sx of Bleeding  -Iron Supplementation with Ferrous Sulfate 325 mg po BID now D/C'd -Will not repeat CBC now that patient is CMO   Pancytopenia -WBC was 2.4, Hb/Hct was 8.5/26.2, and Platelet Count was 60  -Repeat WBC is 3.9, Hb/Hct is 9.6/28.4 and Platelet Count is 90 -Will not Repeat CBC in AM -As above  Acute encephalopathy, improved per Wife appears Baseline -Etiology unclear. UA was negative.  -WBC normal on admission and now decreased -Afebrile. Prior h/o TIA  -CT head r/o masses or bleeding showed Senescent changes without acute or reversible finding. -Vitamin B12 was normal as well as Ammonia Level -C/w IVF Rehydration; Changed from NS to D5W + KCl given patient's NPO status -Unasyn for infection coverage D/C'd now that patient is Comfort   AKI on CKD Stage 2 -Improved with IVF Rehydration and Blood Administration -BUN/Cr went from 29/1.24 -> 21/1.11 -> 15/1.21 -> 10/1.06 -> 5/0.99 -Will not check CMP  Hypertension -Home anti-hypertensive medications D/C'd now that patient is Comfort  Hyperlipidemia -Held home Atorvastatin 10 mg po Daily as patient is NPO but now D/C'd that patient is Comfort Care  Hypothyroidism: -Change home Synthroid to IV but now will D/C Synthroid now that patient is Comfort Care -Checked TSH and was  1.351  Dementia; Parkinsons  -Not on meds at this time   Dysphagia with Mass noted on Swallow Eval -Was STRICT NPO and considered Cortrak for Nutrition/Hydration but would be hesitant to put one in given DIC; Will allow patient to eat now that he is [place] and wants Pleasure Feedings -Check CT of Neck showed No pharyngeal abnormality identified aside from a possible small right pharyngocele at the hypopharynx. Small volume retained barium in the oral cavity,  pharynx, and visible esophagus. No neck mass or lymphadenopathy identified in the absence of IV contrast. Previous C4 through C7 level ACDF with solid appearing arthrodesis. -CT of Neck was Negative so Bone Marrow Biopsy was recommended but Family refusing -Palliative Care consulted for further Goals of Care and if patient is transitioned to Hospice/Comfort Care will allow Comfort feedings fully knowing that patient will aspirate.   Discharge Instructions  Discharge Instructions    Call MD for:  difficulty breathing, headache or visual disturbances    Complete by:  As directed    Call MD for:  extreme fatigue    Complete by:  As directed    Call MD for:  hives    Complete by:  As directed    Call MD for:  persistant dizziness or light-headedness    Complete by:  As directed    Call MD for:  persistant nausea and vomiting    Complete by:  As directed    Call MD for:  redness, tenderness, or signs of infection (pain, swelling, redness, odor or green/yellow discharge around incision site)    Complete by:  As directed    Call MD for:  severe uncontrolled pain    Complete by:  As directed    Call MD for:  temperature >100.4    Complete by:  As directed    Diet - low sodium heart healthy    Complete by:  As directed    Discharge instructions    Complete by:  As directed    Follow up Care per Hospice Protocol   Increase activity slowly    Complete by:  As directed      Allergies as of 08/07/2017      Reactions   Niacin And Related Nausea And Vomiting, Other (See Comments)   And dizzy   Phenergan [promethazine Hcl] Other (See Comments)   hallucinations   Valium [diazepam] Other (See Comments)   Severe confusion   Azithromycin Rash      Medication List    STOP taking these medications   amLODipine 10 MG tablet Commonly known as:  NORVASC   amoxicillin 500 MG capsule Commonly known as:  AMOXIL   aspirin 325 MG EC tablet   atorvastatin 10 MG tablet Commonly known as:   LIPITOR   B-12 1000 MCG Subl   carbidopa-levodopa 25-100 MG tablet Commonly known as:  SINEMET IR   docusate sodium 100 MG capsule Commonly known as:  COLACE   Fish Oil 1000 MG Caps   Iron 325 (65 Fe) MG Tabs   levothyroxine 112 MCG tablet Commonly known as:  SYNTHROID, LEVOTHROID   losartan 25 MG tablet Commonly known as:  COZAAR   multivitamin tablet     TAKE these medications   acetaminophen 325 MG tablet Commonly known as:  TYLENOL Take 2 tablets (650 mg total) by mouth every 6 (six) hours as needed for mild pain (or Fever >/= 101).   antiseptic oral rinse Liqd Apply 15 mLs topically as needed for dry  mouth.   feeding supplement Liqd Take 1 Container by mouth 3 (three) times daily between meals.   glycopyrrolate 1 MG tablet Commonly known as:  ROBINUL Take 1 tablet (1 mg total) by mouth every 4 (four) hours as needed (excessive secretions).   haloperidol 0.5 MG tablet Commonly known as:  HALDOL Take 1 tablet (0.5 mg total) by mouth every 4 (four) hours as needed for agitation (anxiety or delirium).   morphine CONCENTRATE 10 MG/0.5ML Soln concentrated solution Take 0.25 mLs (5 mg total) by mouth every hour as needed for moderate pain (or dyspnea).   polyvinyl alcohol 1.4 % ophthalmic solution Commonly known as:  LIQUIFILM TEARS Place 1 drop into both eyes 4 (four) times daily as needed for dry eyes.            Discharge Care Instructions        Start     Ordered   08/07/17 0000  acetaminophen (TYLENOL) 325 MG tablet  Every 6 hours PRN     08/07/17 1352   08/07/17 0000  antiseptic oral rinse (BIOTENE) LIQD  As needed     08/07/17 1352   08/07/17 0000  feeding supplement (BOOST / RESOURCE BREEZE) LIQD  3 times daily between meals     08/07/17 1352   08/07/17 0000  haloperidol (HALDOL) 0.5 MG tablet  Every 4 hours PRN     08/07/17 1352   08/07/17 0000  glycopyrrolate (ROBINUL) 1 MG tablet  Every 4 hours PRN     08/07/17 1352   08/07/17 0000   Morphine Sulfate (MORPHINE CONCENTRATE) 10 MG/0.5ML SOLN concentrated solution  Every 1 hour PRN     08/07/17 1352   08/07/17 0000  polyvinyl alcohol (LIQUIFILM TEARS) 1.4 % ophthalmic solution  4 times daily PRN     08/07/17 1352   08/07/17 0000  Increase activity slowly     08/07/17 1352   08/07/17 0000  Diet - low sodium heart healthy     08/07/17 1352   08/07/17 0000  Discharge instructions    Comments:  Follow up Care per Hospice Protocol   08/07/17 1352   08/07/17 0000  Call MD for:  temperature >100.4     08/07/17 1352   08/07/17 0000  Call MD for:  persistant nausea and vomiting     08/07/17 1352   08/07/17 0000  Call MD for:  severe uncontrolled pain     08/07/17 1352   08/07/17 0000  Call MD for:  redness, tenderness, or signs of infection (pain, swelling, redness, odor or green/yellow discharge around incision site)     08/07/17 1352   08/07/17 0000  Call MD for:  difficulty breathing, headache or visual disturbances     08/07/17 1352   08/07/17 0000  Call MD for:  hives     08/07/17 1352   08/07/17 0000  Call MD for:  persistant dizziness or light-headedness     08/07/17 1352   08/07/17 0000  Call MD for:  extreme fatigue     08/07/17 1352      Allergies  Allergen Reactions  . Niacin And Related Nausea And Vomiting and Other (See Comments)    And dizzy  . Phenergan [Promethazine Hcl] Other (See Comments)    hallucinations  . Valium [Diazepam] Other (See Comments)    Severe confusion  . Azithromycin Rash   Consultations:  Gastroenterology  Hematology  Palliative Care Medicine   Procedures/Studies: Ct Head Wo Contrast  Result Date: 08/02/2017 CLINICAL  DATA:  Altered level of consciousness that is unexplained. Speech difficulty. Generalized decline EXAM: CT HEAD WITHOUT CONTRAST TECHNIQUE: Contiguous axial images were obtained from the base of the skull through the vertex without intravenous contrast. COMPARISON:  Brain MRI 01/08/2012 FINDINGS: Brain: No  evidence of acute infarction, hemorrhage, hydrocephalus, extra-axial collection or mass lesion/mass effect. Low cerebellar tonsils without significant mass effect. Generalized cerebral volume loss with mild progression since 2013. Vascular: No hyperdense vessel or unexpected calcification. Skull: No acute or aggressive finding. Sinuses/Orbits: No acute finding.  Bilateral cataract resection. IMPRESSION: Senescent changes without acute or reversible finding. Electronically Signed   By: Monte Fantasia M.D.   On: 08/02/2017 15:51   Ct Soft Tissue Neck Wo Contrast  Result Date: 08/04/2017 CLINICAL DATA:  71 year old male with abnormal modified barium swallow. Possible neck or pharyngeal mass. EXAM: CT NECK WITHOUT CONTRAST TECHNIQUE: Multidetector CT imaging of the neck was performed following the standard protocol without intravenous contrast. COMPARISON:  Modified barium swallow images 1223 hours today. Chest CT 1241 hours today. Head CT without contrast 08/02/2017. FINDINGS: Pharynx and larynx: Laryngeal soft tissue contours are within normal limits. There is a small volume of retained barium in the hypopharynx including the vallecula and piriform sinuses. There is asymmetric caudal extension of this retained barium at the right hypopharynx which could indicate a small pharyngocele(coronal image 43). Oropharynx contours are normal. Negative nasopharynx. Small volume of retained barium in the oral cavity. Negative parapharyngeal and retropharyngeal spaces. Salivary glands: Negative noncontrast sublingual space, submandibular glands and parotid glands. Thyroid: Diminutive, negative. Lymph nodes: No lymphadenopathy identified. Vascular: Vascular patency is not evaluated in the absence of IV contrast. Mild for age carotid bifurcation calcified plaque. Mild distal vertebral artery calcified plaque. Limited intracranial: Negative. Visualized orbits: Negative. Mastoids and visualized paranasal sinuses: Visualized  paranasal sinuses and mastoids are stable and well pneumatized. Skeleton: Previous C4 through C7 level ACDF. Solid-appearing arthrodesis. Hardware remains in place. There is mild anterior protrusion of the C7 cortical screws, more so the left, but no evidence of hardware loosening at that level. Mild anterolisthesis of C7 on T1 associated with facet hypertrophy. No acute osseous abnormality identified. Upper chest: Chest findings today are reported separately. Small volume retained barium in the visible esophagus. IMPRESSION: 1. No pharyngeal abnormality identified aside from a possible small right pharyngocele at the hypopharynx. Small volume retained barium in the oral cavity, pharynx, and visible esophagus. 2. No neck mass or lymphadenopathy identified in the absence of IV contrast. 3. Previous C4 through C7 level ACDF with solid appearing arthrodesis. 4. Chest CT today reported separately. Electronically Signed   By: Genevie Ann M.D.   On: 08/04/2017 18:51   Ct Chest Wo Contrast  Result Date: 08/04/2017 CLINICAL DATA:  Shortness of breath EXAM: CT CHEST WITHOUT CONTRAST TECHNIQUE: Multidetector CT imaging of the chest was performed following the standard protocol without IV contrast. COMPARISON:  01/10/2008 FINDINGS: Cardiovascular: Normal heart size. Scattered coronary calcifications. No pericardial effusion. Patchy calcifications in the aortic arch in descending thoracic segment without aneurysm. Mediastinum/Nodes: No enlarged mediastinal or axillary lymph nodes. Thyroid gland, trachea, and esophagus demonstrate no significant findings. Residual contrast in the mid esophagus from recent swallow study. Lungs/Pleura: Scattered clustered ground-glass opacities in the superior segment left upper lobe, and in the medial basal segment right lower lobe, up to 8 mm. Linear scarring or subsegmental atelectasis posteriorly in the left lower lobe. No pneumothorax. No effusion. Upper Abdomen: No acute findings. Residual  contrast in the gastric lumen  from recent swallowing study. Musculoskeletal: Cervical fixation hardware partially visualized. Anterior vertebral endplate spurring at multiple levels in the mid thoracic spine. Negative for fracture or worrisome bone lesion. IMPRESSION: 1. Scattered cluster ground-glass opacities in both lower lobes, likely infectious/inflammatory. Non-contrast chest CT at 3-6 months is recommended. If nodules persist, subsequent management will be based upon the most suspicious nodule(s). This recommendation follows the consensus statement: Guidelines for Management of Incidental Pulmonary Nodules Detected on CT Images: From the Fleischner Society 2017; Radiology 2017; 284:228-243. 2. Coronary and Aortic Atherosclerosis (ICD10-170.0) Electronically Signed   By: Lucrezia Europe M.D.   On: 08/04/2017 13:56   Mr Abdomen Wo Contrast  Result Date: 08/04/2017 CLINICAL DATA:  Weight loss.  Unintended weight loss.  Pancytopenia. EXAM: MRI ABDOMEN WITHOUT CONTRAST TECHNIQUE: Multiplanar multisequence MR imaging was performed without the administration of intravenous contrast. COMPARISON:  CT 07/10/2016, retrograde pyelogram 08/10/2016 FINDINGS: Lower chest:  Lung bases are clear. Hepatobiliary: No focal hepatic lesion. No duct dilatation. Several large gallstones within lumen gallbladder. No gallbladder stones or inflammation. Common bile duct normal caliber. Some motion degradation of the exam. Pancreas: Motion degradation limits evaluation the pancreas. No pancreatic lesion identified. No pancreatic duct dilatation. Spleen: Normal volume spleen measures spleen measures 12.0 x 10.2 x 5.1 cm (volume = 330 cm^3). Adrenals/urinary tract: Adrenal glands are normal. No renal abnormality identified. No abnormality collecting system of the LEFT kidney. No IV contrast was administered. Stomach/Bowel: Stomach and limited of the small bowel is unremarkable Vascular/Lymphatic: Abdominal aortic normal caliber. No  retroperitoneal periportal lymphadenopathy. Musculoskeletal: No aggressive osseous lesion IMPRESSION: 1. No evidence of malignancy non contrast abdominal MRI. 2. Exam is limited by lack of IV contrast and patient motion. 3. No abnormality of the LEFT kidney. Of note patient has what appears to be normal retrograde pyelogram on 08/10/2016. Recommend correlation with a [facility] notes. 4. Cholelithiasis without cholecystitis. Electronically Signed   By: Suzy Bouchard M.D.   On: 08/04/2017 09:31   Dg Chest Port 1 View  Result Date: 08/02/2017 CLINICAL DATA:  Weakness.  Acute encephalopathy. EXAM: PORTABLE CHEST 1 VIEW COMPARISON:  01/08/2012 FINDINGS: Cardiomediastinal silhouette is normal. Mediastinal contours appear intact. Tortuosity of the aorta noted. There is no evidence of focal airspace consolidation, pleural effusion or pneumothorax. Osseous structures are without acute abnormality. Soft tissues are grossly normal. IMPRESSION: No active disease. Electronically Signed   By: Fidela Salisbury M.D.   On: 08/02/2017 11:38   Dg Swallowing Func-speech Pathology  Result Date: 08/04/2017 Objective Swallowing Evaluation: Type of Study: MBS-Modified Barium Swallow Study Patient Details Name: MELBERT BOTELHO MRN: [id number] Date of Birth: [birth year] Today's Date: 08/04/2017 Time: SLP Start Time (ACUTE ONLY): 1200-SLP Stop Time (ACUTE ONLY): 1220 SLP Time Calculation (min) (ACUTE ONLY): 20 min Past Medical History: Past Medical History: Diagnosis Date . Anemia  . CKD (chronic kidney disease), stage II  . 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 History of adenomatous polyp of colon   hyperplastic  2013 . History of kidney stones  . History of transient ischemic attack (TIA)   01-07-2012 . Hyperlipidemia  . Hypertension  . Hypothyroidism  . Impairment of balance  . Legally blind in left eye, as defined in [place]   CONGENITAL . OA (osteoarthritis)  . Pancytopenia ([place])  . Parkinsonism ([place])   UNSPECIFIED  type --  NEUROLOGIST-- DR Star Age . Splenomegaly  . Ureteral tumor   left upper tract . Wears dentures   full upper and  partial lower . Wears glasses  . Wears hearing aid   bilateral hears ---  very hoh over phone Past Surgical History: Past Surgical History: Procedure Laterality Date . ANTERIOR CERVICAL DECOMP/DISCECTOMY FUSION  06/04/2008  C4 -- C7 . CATARACT EXTRACTION W/ INTRAOCULAR LENS  IMPLANT, BILATERAL  2016 . COLONOSCOPY  last one 07-26-2012 . CYSTOSCOPY WITH RETROGRADE PYELOGRAM, URETEROSCOPY AND STENT PLACEMENT Left 08/10/2016  Procedure: CYSTOSCOPY WITH LEFT  RETROGRADE PYELOGRAM, URETEROSCOPY URETERAL BIOPSY,  AND STENT PLACEMENT;  Surgeon: Ardis Hughs, MD;  Location: [facility];  Service: Urology;  Laterality: Left; . CYSTOSCOPY/RETROGRADE/URETEROSCOPY/STONE EXTRACTION WITH BASKET  1987 . INGUINAL HERNIA REPAIR Right 1980's . TRANSTHORACIC ECHOCARDIOGRAM  01/09/2012  mild focal basal LVH,  ef 55-60%,  grade 1 diastolic dysfunction/  mild AV stenosis , no regurg. 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 He was brought to the ED, after his wife found him tohave increased confusion, generalized weakness, and 1 episode of what melanotic stools. This follows the recent teeth extraction. She deniedthe patient having any increase shortness of breath, or cough. Has significant FTT with a 20 lb weight loss over the last 2 months. She reports that he has a new tremor on his lips, for the last week. He was admitted for confusion and dark melanotic stools and is currently being worked up. Gastroenterology  consulted and patient underwent EGD revealing normal esophagus and stomach, non-bleeding duodenal diverticulum, no evidence of upper GI bleed. Non contrast abdominal MRI revealed no evidence of malignancy. CT head with no acute findings. CXR 8/30 showed no active disease. Per notes, pt coughing with liquids. Referred for bedside swallowing evaluation.  Subjective: upright in chair, wife present Assessment / Plan / Recommendation CH[facility] IP CLINICAL IMPRESSIONS 08/04/2017 Clinical Impression Patient presents with moderate oral and severe pharyngoesophageal dysphagia and severe risk for aspiration. Pt's oral phase characterized by lingual weakness, sluggish oral transit. Swallow initiation is relatively swift once bolus reaches the base of tongue, however pt's pharygneal swallow is impacted by what appears to be a region of prominent tissue in the supraglottic region that appears to protrude into the pharynx, impeding hyolaryngeal excursion and passage of boluses through the pharynx. Palpation of this region notable for edema. As a result, severe residue with puree (100% of the bolus) is retained in the valleculae. Attempted postural manuevers including chin tuck, head turn, liquid wash which were not effective in facilitating bolus clearance. Thin liquid wash mixed with vallecular residue which then spilled into the pyriform sinuses and upper laryngeal vestibule. Approx 10% of the bolus passed through the UES, however severe residue remained above the UES, along the posterior pharynx, pyriforms and in the valleculae. Patient is at severe risk for aspiration of residue with all consistencies. Recommend NPO, pt may benefit from cortrak for alternative means of nutrition/hydration. Recommend ENT consult; pt may also benefit from palliative consult given pt's comorbidities which may impact his ability to safely resume oral feeding. MD paged. Will follow for further education, goals of care.  SLP Visit Diagnosis Dysphagia,  pharyngoesophageal phase (R13.14);Dysphagia, oropharyngeal phase (R13.12) Attention and concentration deficit following -- Frontal lobe and executive function deficit following -- Impact on safety  and function Severe aspiration risk;Risk for inadequate nutrition/hydration   [facility]L IP TREATMENT RECOMMENDATION 08/04/2017 Treatment Recommendations Therapy as outlined in treatment plan below   Prognosis 08/04/2017 Prognosis for Safe Diet Advancement Guarded Barriers to Reach Goals Severity of deficits;Other (Comment) Barriers/Prognosis Comment -- [facility] IP DIET RECOMMENDATION 08/04/2017 SLP Diet Recommendations NPO Liquid Administration via -- Medication Administration -- Compensations -- Postural Changes --   CHL IP OTHER RECOMMENDATIONS 08/04/2017 Recommended Consults Consider ENT evaluation;Other (Comment) Oral Care Recommendations Oral care QID Other Recommendations Remove water pitcher;Have oral suction available   [facility] IP FOLLOW UP RECOMMENDATIONS 08/04/2017 Follow up Recommendations Skilled Nursing facility   [facility] IP FREQUENCY AND DURATION 08/04/2017 Speech Therapy Frequency (ACUTE ONLY) min 2x/week Treatment Duration 2 weeks      [facility]L IP ORAL PHASE 08/04/2017 Oral Phase Impaired Oral - Pudding Teaspoon -- Oral - Pudding Cup -- Oral - Honey Teaspoon -- Oral - Honey Cup -- Oral - Nectar Teaspoon -- Oral - Nectar Cup -- Oral - Nectar Straw -- Oral - Thin Teaspoon -- Oral - Thin Cup Weak lingual manipulation;Lingual pumping;Holding of bolus;Delayed oral transit Oral - Thin Straw -- Oral - Puree Weak lingual manipulation;Lingual pumping;Reduced posterior propulsion;Delayed oral transit Oral - Mech Soft -- Oral - Regular -- Oral - Multi-Consistency -- Oral - Pill -- Oral Phase - Comment --  [facility] IP PHARYNGEAL PHASE 08/04/2017 Pharyngeal Phase Impaired Pharyngeal- Pudding Teaspoon -- Pharyngeal -- Pharyngeal- Pudding Cup -- Pharyngeal -- Pharyngeal- Honey Teaspoon -- Pharyngeal -- Pharyngeal- Honey Cup -- Pharyngeal -- Pharyngeal- Nectar  Teaspoon -- Pharyngeal -- Pharyngeal- Nectar Cup -- Pharyngeal -- Pharyngeal- Nectar Straw -- Pharyngeal -- Pharyngeal- Thin Teaspoon -- Pharyngeal -- Pharyngeal- Thin Cup Reduced pharyngeal peristalsis;Reduced epiglottic inversion;Reduced anterior laryngeal mobility;Reduced laryngeal elevation;Reduced airway/laryngeal closure;Penetration/Apiration after swallow;Pharyngeal residue - valleculae;Pharyngeal residue - pyriform;Pharyngeal residue - posterior pharnyx;Pharyngeal residue - cp segment Pharyngeal Material enters airway, remains ABOVE vocal cords and not ejected out Pharyngeal- Thin Straw -- Pharyngeal -- Pharyngeal- Puree Reduced epiglottic inversion;Reduced anterior laryngeal mobility;Reduced laryngeal elevation;Reduced airway/laryngeal closure;Pharyngeal residue - valleculae Pharyngeal -- Pharyngeal- Mechanical Soft -- Pharyngeal -- Pharyngeal- Regular -- Pharyngeal -- Pharyngeal- Multi-consistency -- Pharyngeal -- Pharyngeal- Pill -- Pharyngeal -- Pharyngeal Comment --  [facility]L IP CERVICAL ESOPHAGEAL PHASE 08/04/2017 Cervical Esophageal Phase Impaired Pudding Teaspoon -- Pudding Cup -- Honey Teaspoon -- Honey Cup -- Nectar Teaspoon -- Nectar Cup -- Nectar Straw -- Thin Teaspoon -- Thin Cup Reduced cricopharyngeal relaxation Thin Straw -- Puree Reduced cricopharyngeal relaxation Mechanical Soft -- Regular -- Multi-consistency -- Pill -- Cervical Esophageal Comment -- [name], MS, CCC-SLP Speech-Language Pathologist [phone] No flowsheet data found. [name] 08/04/2017, 1:14 PM               EGD Findings: The examined esophagus was normal. The entire examined stomach was normal. A large non-bleeding diverticulum was found in the area of the papilla. The exam of the duodenum was otherwise normal. Impression: - Old, dried blood in the mouth without active  bleeding (recent gingival bleeding treated by   endodontist). - Normal esophagus. - Normal stomach. - Non-bleeding duodenal diverticulum. - Otherwise normal duodenum. - No evidence of upper GI bleeding. - No specimens collected.  Subjective: Seen and examined and was resting comfortably. No complaints. Per family ate a little dinner last night. No other concerns and ready to go to Hospice.   Discharge Exam: Vitals:   08/06/17 2112 08/07/17 0903  BP: 140/88 (!) 147/71  Pulse: 70 88  Resp: 17 20  Temp: 97.6 F (36.4 C) (!) 97.5 F (36.4 C)  SpO2: 100% 99%   Vitals:   08/06/17 0520 08/06/17 1023 08/06/17 2112 08/07/17 0903  BP: (!) 142/66 (!) 142/81 140/88 (!) 147/71  Pulse: 76 71 70 88  Resp: '16 17 17 20  '$ Temp: 97.7 F (36.5 C) 97.7 F (36.5 C) 97.6 F (36.4 C) (!) 97.5 F (36.4 C)  TempSrc: Oral Oral Oral Axillary  SpO2: 100% 100% 100% 99%  Weight:      Height:       General: Pt is a thin cachectic Caucasian frail appearing male who is alert, awake, not in acute distress; Incomprehensible at at times Cardiovascular: RRR, S1/S2 +, no rubs, no gallops Respiratory: CTA bilaterally, no wheezing, no rhonchi Abdominal: Soft, NT, ND, bowel sounds + Extremities: no edema, no cyanosis  The results of significant diagnostics from this hospitalization (including imaging, microbiology, ancillary and laboratory) are listed below for reference.    Microbiology: Recent Results (from the past 240 hour(s))  Culture, blood (Routine X 2) w Reflex to ID Panel     Status: None (Preliminary result)   Collection Time: 08/02/17  5:08 PM  Result Value Ref Range Status   Specimen Description BLOOD RIGHT ARM  Final   Special Requests IN PEDIATRIC BOTTLE Blood Culture adequate volume  Final   Culture NO GROWTH 4 DAYS  Final   Report Status PENDING  Incomplete   Culture, blood (Routine X 2) w Reflex to ID Panel     Status: None (Preliminary result)   Collection Time: 08/02/17  5:27 PM  Result Value Ref Range Status   Specimen Description BLOOD RIGHT ARM  Final   Special Requests IN PEDIATRIC BOTTLE Blood Culture adequate volume  Final   Culture NO GROWTH 4 DAYS  Final   Report Status PENDING  Incomplete    Labs: BNP (last 3 results) No results for input(s): BNP in the last 8760 hours. Basic Metabolic Panel:  Recent Labs Lab 08/03/17 0013 08/03/17 0939 08/04/17 0630 08/05/17 0623 08/06/17 0447  NA 140 143 145 142 137  K 3.5 3.7 3.7 3.7 3.6  CL 111 111 113* 112* 107  CO2 21* 21* 21* 22 23  GLUCOSE 99 90 74 108* 116*  BUN 29* 21* 15 10 5*  CREATININE 1.24 1.11 1.21 1.06 0.99  CALCIUM 8.2* 8.4* 8.5* 8.4* 8.4*  MG  --  2.1 2.0 1.9 1.7  PHOS  --  3.1 3.6 2.7 2.5   Liver Function Tests:  Recent Labs Lab 08/03/17 0013 08/03/17 0939 08/04/17 0630 08/05/17 0623 08/06/17 0447  AST '25 29 25 25 23  '$ ALT 16* 17 18 16* 15*  ALKPHOS 38 41 40 38 41  BILITOT 0.7 0.6 1.0 0.6 0.6  PROT 4.8* 5.3* 5.1* 5.1* 5.3*  ALBUMIN 3.0* 3.2* 3.0* 2.9* 3.2*   No results for input(s): LIPASE, AMYLASE in the last 168 hours.  Recent Labs Lab 08/02/17 1154  AMMONIA 10   CBC:  Recent Labs Lab 08/03/17 2032 08/04/17 0630 08/04/17 1439 08/05/17 0623 08/06/17 0447  WBC 3.9* 3.2* 4.3 2.7* 3.9*  NEUTROABS 2.8 2.2 3.1 1.6* 2.7  HGB 8.9* 9.0* 9.6* 8.7* 9.6*  HCT 26.9* 26.8* 29.1* 25.9* 28.4*  MCV 85.9 85.4 85.3 84.6 84.3  PLT 69* 65* 81* 69*  72* 87*  90*   Cardiac Enzymes:  Recent Labs Lab 08/02/17 1154  TROPONINI <0.03   BNP: Invalid input(s): POCBNP CBG:  Recent Labs Lab 08/05/17 2015 08/06/17 0150 08/06/17  0432 08/06/17 0802 08/06/17 1126  GLUCAP 119* 89 104* 118* 106*   D-Dimer  Recent Labs  08/05/17 0623 08/06/17 0447  DDIMER >20.00* >20.00*   Hgb A1c No results for input(s): HGBA1C in the last 72 hours. Lipid  Profile No results for input(s): CHOL, HDL, LDLCALC, TRIG, CHOLHDL, LDLDIRECT in the last 72 hours. Thyroid function studies No results for input(s): TSH, T4TOTAL, T3FREE, THYROIDAB in the last 72 hours.  Invalid input(s): FREET3 Anemia work up No results for input(s): VITAMINB12, FOLATE, FERRITIN, TIBC, IRON, RETICCTPCT in the last 72 hours. Urinalysis    Component Value Date/Time   COLORURINE YELLOW 08/02/2017 1033   APPEARANCEUR CLEAR 08/02/2017 1033   LABSPEC 1.015 08/02/2017 1033   PHURINE 6.0 08/02/2017 [place] 08/02/2017 1033   HGBUR NEGATIVE 08/02/2017 [place] 08/02/2017 1033   KETONESUR NEGATIVE 08/02/2017 1033   PROTEINUR NEGATIVE 08/02/2017 1033   UROBILINOGEN 0.2 01/07/2012 1235   NITRITE NEGATIVE 08/02/2017 1033   LEUKOCYTESUR NEGATIVE 08/02/2017 1033   Sepsis Labs Invalid input(s): PROCALCITONIN,  WBC,  LACTICIDVEN Microbiology Recent Results (from the past 240 hour(s))  Culture, blood (Routine X 2) w Reflex to ID Panel     Status: None (Preliminary result)   Collection Time: 08/02/17  5:08 PM  Result Value Ref Range Status   Specimen Description BLOOD RIGHT ARM  Final   Special Requests IN PEDIATRIC BOTTLE Blood Culture adequate volume  Final   Culture NO GROWTH 4 DAYS  Final   Report Status PENDING  Incomplete  Culture, blood (Routine X 2) w Reflex to ID Panel     Status: None (Preliminary result)   Collection Time: 08/02/17  5:27 PM  Result Value Ref Range Status   Specimen Description BLOOD RIGHT ARM  Final   Special Requests IN PEDIATRIC BOTTLE Blood Culture adequate volume  Final   Culture NO GROWTH 4 DAYS  Final   Report Status PENDING  Incomplete   Time coordinating discharge: 35 minutes  SIGNED:  Kerney Elbe, DO [facility] 08/07/2017, 1:52 PM Pager [phone]  If 7PM-7AM, please contact night-coverage [email or web address] Password [email or web address]

## 2017-08-07 NOTE — Progress Notes (Signed)
Discharge to: [facility] Anticipated discharge date: 08/07/17 Family notified: Yes, at bedside Transportation by: [facility]  CSW signing off.  Laveda Abbe LCSW [phone]

## 2017-08-07 NOTE — Progress Notes (Signed)
Pt discharge education and instructions completed. Pt discharge to [facility] in [place] and report called off to nurse Butch Penny at the facility. Per nurse Butch Penny request pt IV left intact saline locked along with pt's condom cath. Pt family at bedside notified and agree. Pt awaiting on [facility] to pick up to disposition. Will continue to closely monitor pt till pt pick up. P. Angelica Pou RN

## 2017-08-07 NOTE — Anesthesia Postprocedure Evaluation (Signed)
Anesthesia Post Note  Patient: Kyle Espinoza  Procedure(s) Performed: Procedure(s) (LRB): ESOPHAGOGASTRODUODENOSCOPY (EGD) (N/A)     Patient location during evaluation: PACU Anesthesia Type: MAC Level of consciousness: awake and alert Pain management: pain level controlled Vital Signs Assessment: post-procedure vital signs reviewed and stable Respiratory status: spontaneous breathing, nonlabored ventilation, respiratory function stable and patient connected to nasal cannula oxygen Cardiovascular status: stable and blood pressure returned to baseline Anesthetic complications: no    Last Vitals:  Vitals:   08/06/17 2112 08/07/17 0903  BP: 140/88 (!) 147/71  Pulse: 70 88  Resp: 17 20  Temp: 36.4 C (!) 36.4 C  SpO2: 100% 99%    Last Pain:  Vitals:   08/07/17 0903  TempSrc: Axillary  PainSc:    Pain Goal: Patients Stated Pain Goal: 2 (08/05/17 0800)               Lyndle Herrlich EDWARD

## 2017-08-07 NOTE — Consult Note (Signed)
           [facility] CM Primary Care Navigator  08/07/2017  Kyle Espinoza [birth year] [id number]   [name] to see patient at the bedside to identify possible discharge needs but he was already discharged.  Patient was discharged today to [facility] in [place] per staff report.  No further health management neededat this time.  For questions, please contact:  Dannielle Huh, BSN, RN- [facility] Primary Care Navigator  Telephone: [phone] [place]

## 2017-08-07 NOTE — Progress Notes (Signed)
Pt picked up by [facility] to be transported off to facility. Pt transported off unit via stretcher with belongings and family at bedside. Pt IV and condom cath remained intact at discharge per facility request. Delia Heady RN.

## 2017-09-03 DEATH — deceased

## 2019-04-14 IMAGING — CT CT NECK W/O CM
4 of 5 series · 13 of 33 positions shown, 15 images · IV contrast (APPLIED)
Comparison: Modified barium swallow images 1224 hours today. Chest
CT 9979 hours today. Head CT without contrast 08/02/2017.

CLINICAL DATA: 70-year-old male with abnormal modified barium
swallow. Possible neck or pharyngeal mass.

EXAM:
CT NECK WITHOUT CONTRAST
TECHNIQUE: Multidetector CT imaging of the neck was performed following the
standard protocol without intravenous contrast.

[Series 3: neck 2.0 i31s 3 · axial · 0.50mm/px · z∈[-241,-125]mm · 3 of 117 slices shown, 4 images]
[im 30/117  soft-tissue]
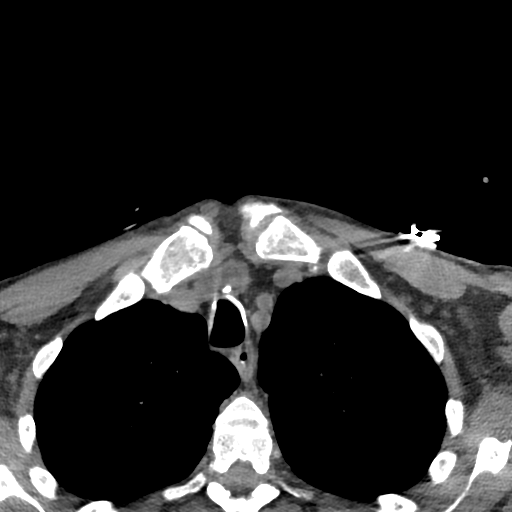
[im 30/117  bone]
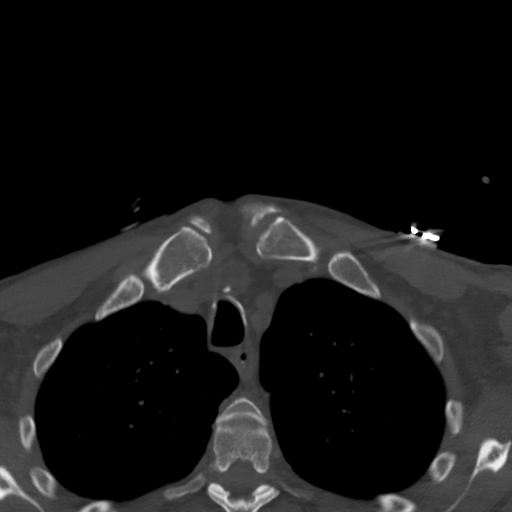
[im 59/117  bone]
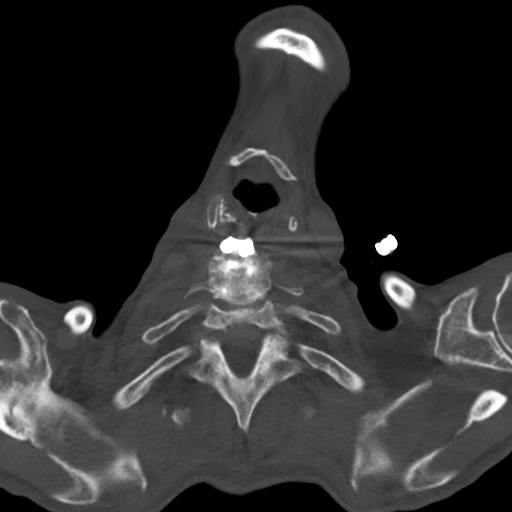
[im 88/117  bone]
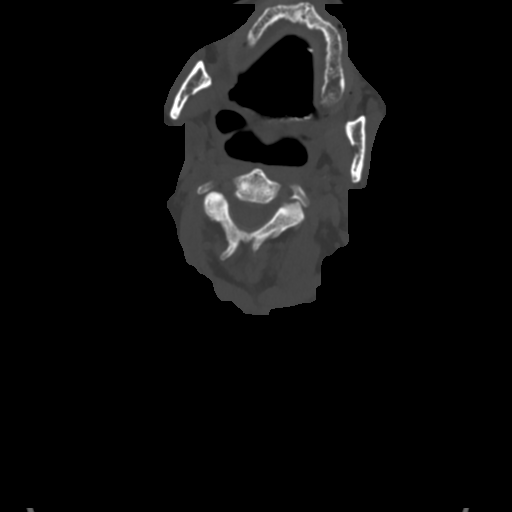

[Series 6: coronal st · coronal · 0.32mm/px · 3 of 89 slices shown]
[im 31/89  bone]
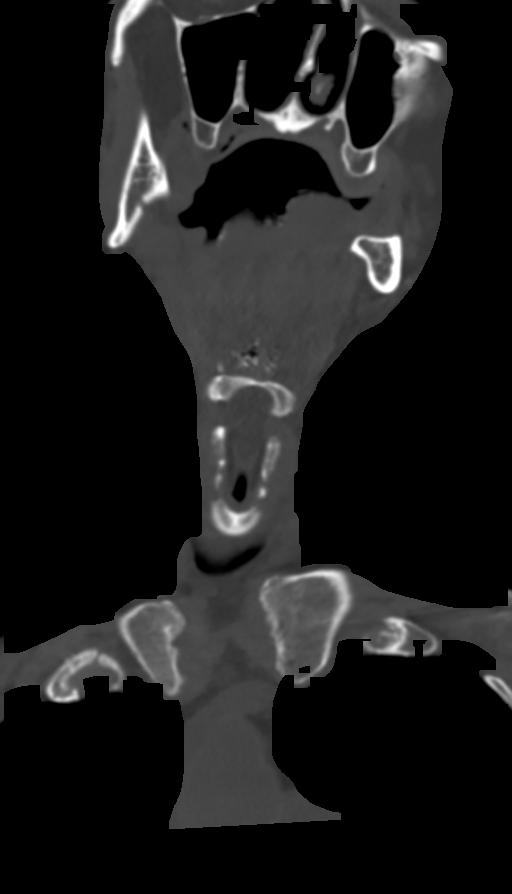
[im 40/89  bone]
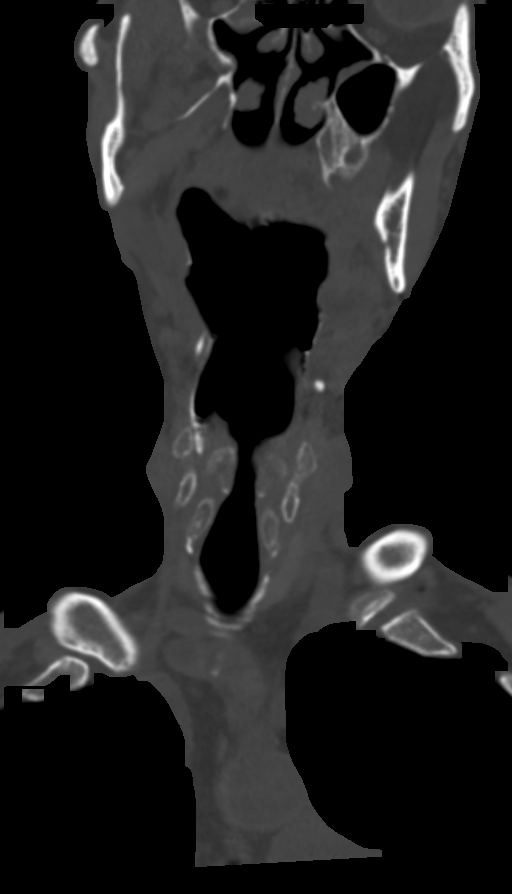
[im 49/89  bone]
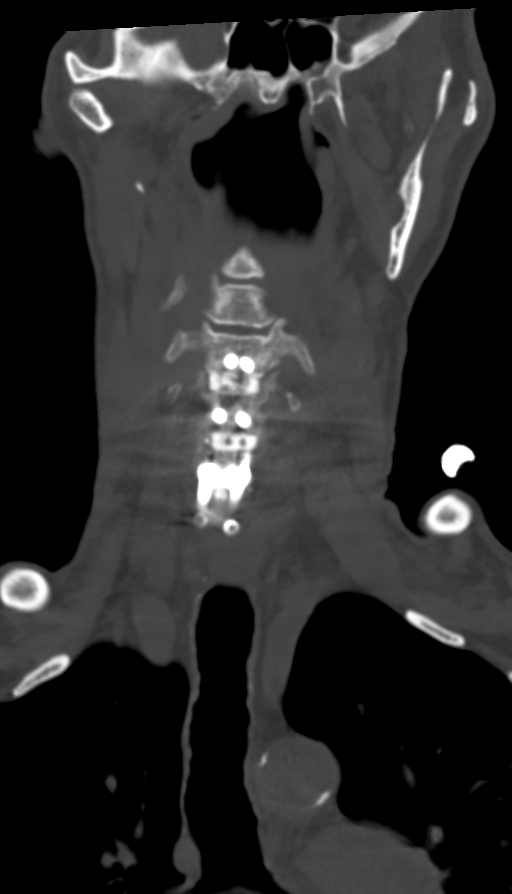

[Series 7: sagittal st · sagittal · 0.45mm/px · 5 of 81 slices shown, 6 images]
[im 27/81  bone]
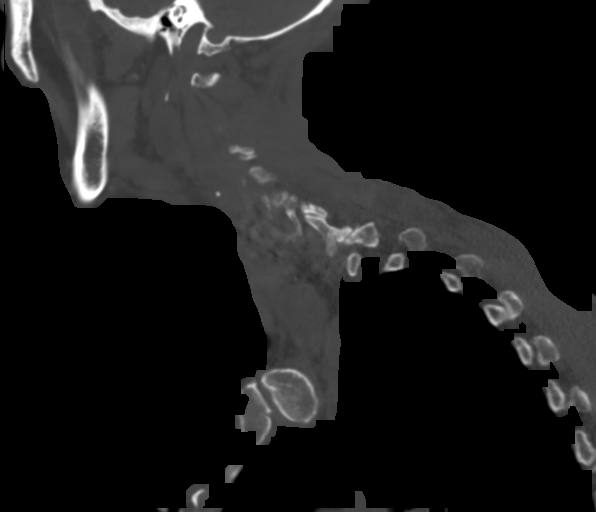
[im 34/81  bone]
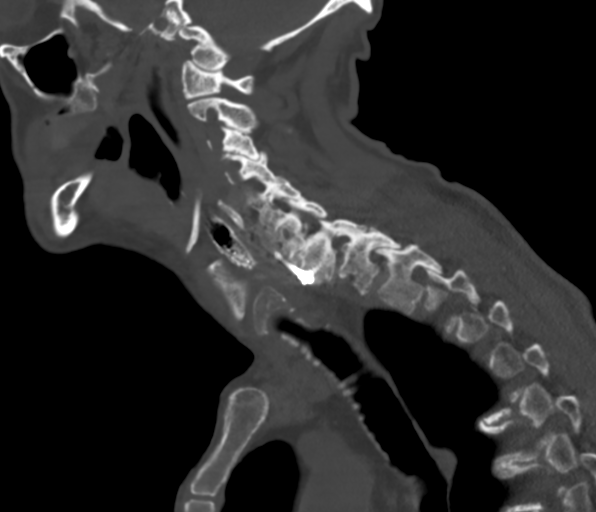
[im 41/81  soft-tissue]
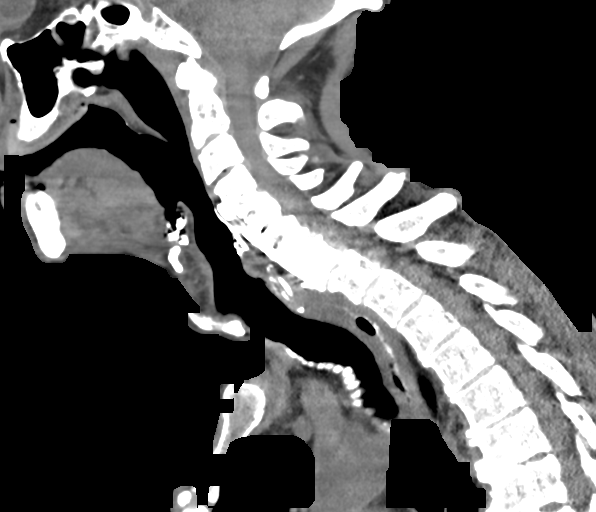
[im 41/81  bone]
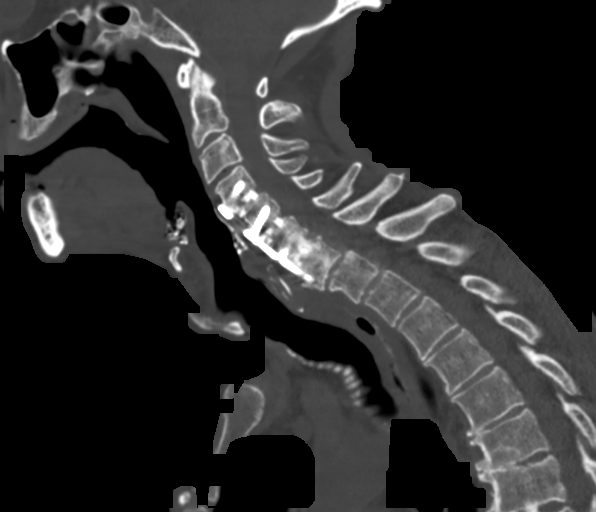
[im 47/81  bone]
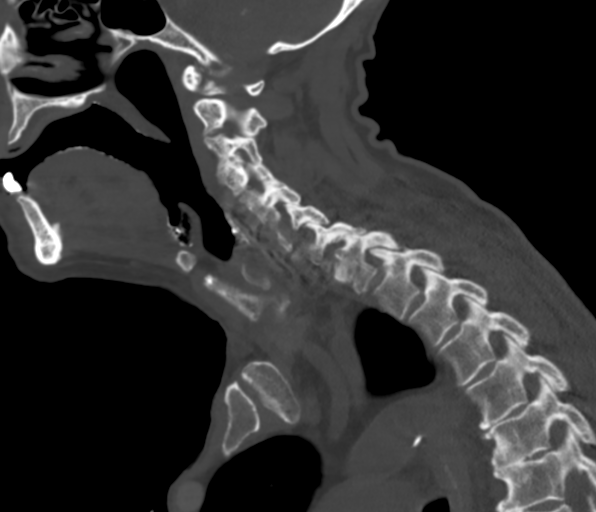
[im 54/81  bone]
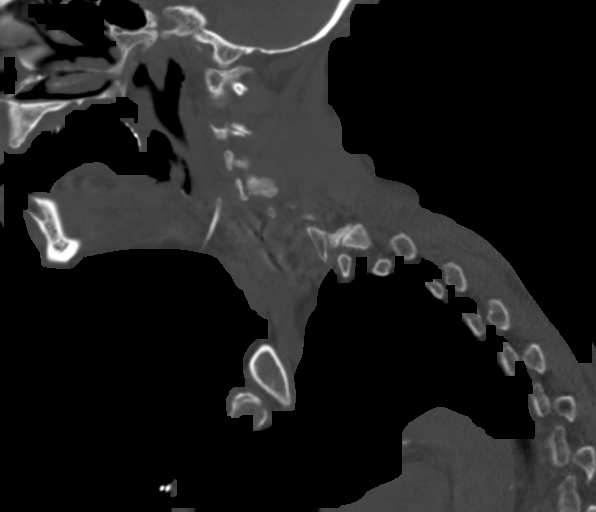

[Series 8: orthogonal st · axial · 0.30mm/px · z∈[-287,-244]mm · 2 of 133 slices shown]
[im 27/133  bone]
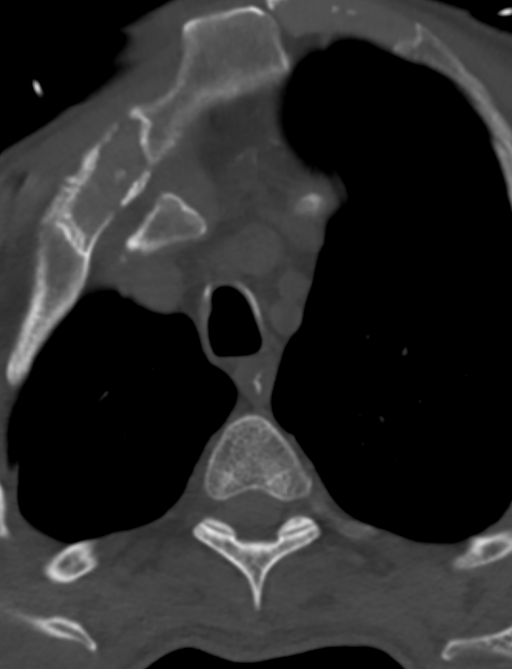
[im 53/133  bone]
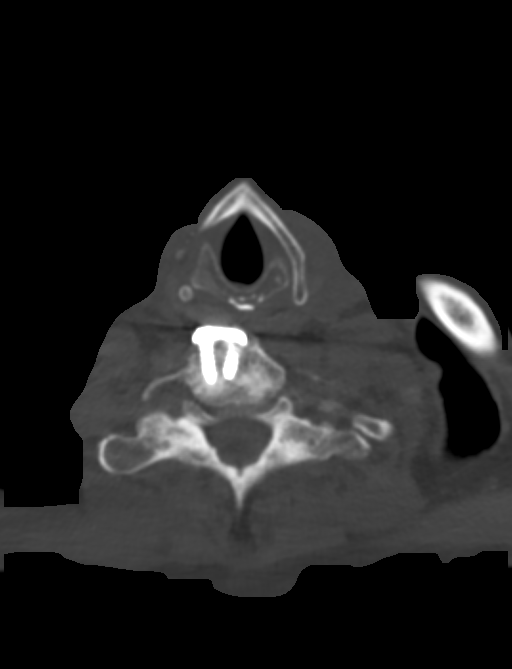

[13 of 33 positions shown; findings below may reference images not displayed]

FINDINGS: Pharynx and larynx: Laryngeal soft tissue contours are within normal
limits.

There is a small volume of retained barium in the hypopharynx
including the vallecula and piriform sinuses. There is asymmetric
caudal extension of this retained barium at the right hypopharynx
which could indicate a small pharyngocele(coronal image 43).

Oropharynx contours are normal. Negative nasopharynx. Small volume
of retained barium in the oral cavity.

Negative parapharyngeal and retropharyngeal spaces.

Salivary glands: Negative noncontrast sublingual space,
submandibular glands and parotid glands.

Thyroid: Diminutive, negative.

Lymph nodes: No lymphadenopathy identified.

Vascular: Vascular patency is not evaluated in the absence of IV
contrast. Mild for age carotid bifurcation calcified plaque. Mild
distal vertebral artery calcified plaque.

Limited intracranial: Negative.

Visualized orbits: Negative.

Mastoids and visualized paranasal sinuses: Visualized paranasal
sinuses and mastoids are stable and well pneumatized.

Skeleton: Previous C4 through C7 level ACDF. Solid-appearing
arthrodesis. Hardware remains in place. There is mild anterior
protrusion of the C7 cortical screws, more so the left, but no
evidence of hardware loosening at that level. Mild anterolisthesis
of C7 on T1 associated with facet hypertrophy. No acute osseous
abnormality identified.

Upper chest: Chest findings today are reported separately. Small
volume retained barium in the visible esophagus.
IMPRESSION: 1. No pharyngeal abnormality identified aside from a possible small
right pharyngocele at the hypopharynx. Small volume retained barium
in the oral cavity, pharynx, and visible esophagus.
2. No neck mass or lymphadenopathy identified in the absence of IV
contrast.
3. Previous C4 through C7 level ACDF with solid appearing
arthrodesis.
4. Chest CT today reported separately.

## 2019-04-14 IMAGING — CT CT CHEST W/O CM
2 of 3 series · 15 of 36 positions shown, 18 images · non-contrast
Comparison: 01/10/2008

CLINICAL DATA: Shortness of breath

EXAM:
CT CHEST WITHOUT CONTRAST
TECHNIQUE: Multidetector CT imaging of the chest was performed following the
standard protocol without IV contrast.

[Series 3: chest w/o 2mm st · axial · non-contrast · 0.88mm/px · z∈[+1148,+1424]mm · 12 of 163 slices shown, 15 images]
[im 13/163  mediastinal]
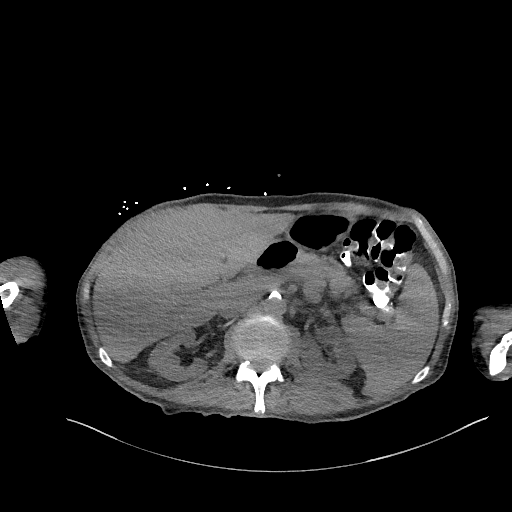
[im 13/163  lung]
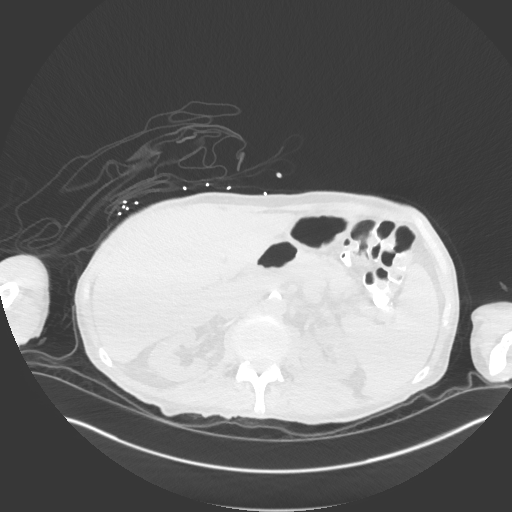
[im 25/163  lung]
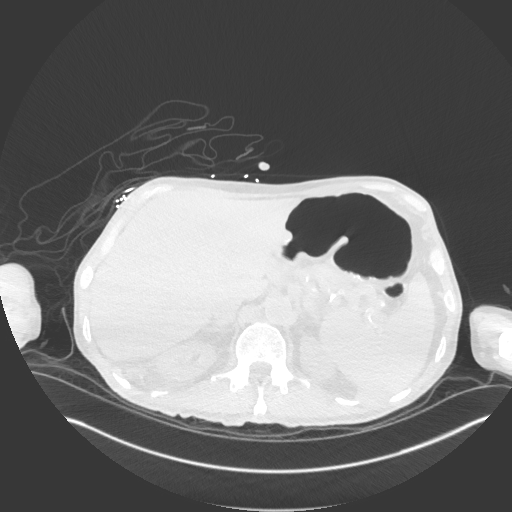
[im 37/163  lung]
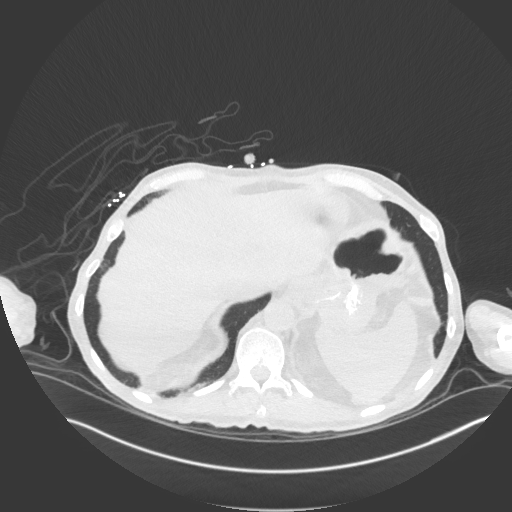
[im 49/163  lung]
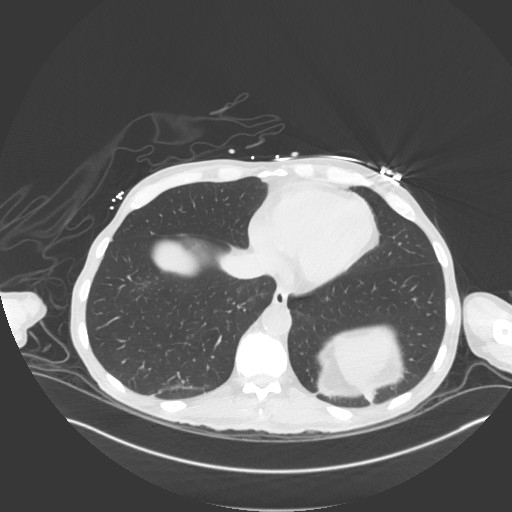
[im 61/163  mediastinal]
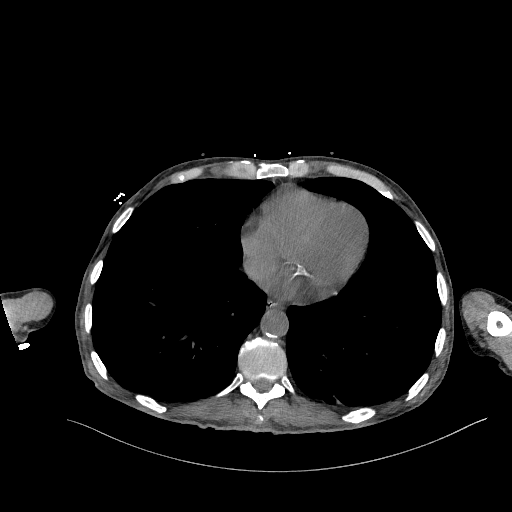
[im 61/163  lung]
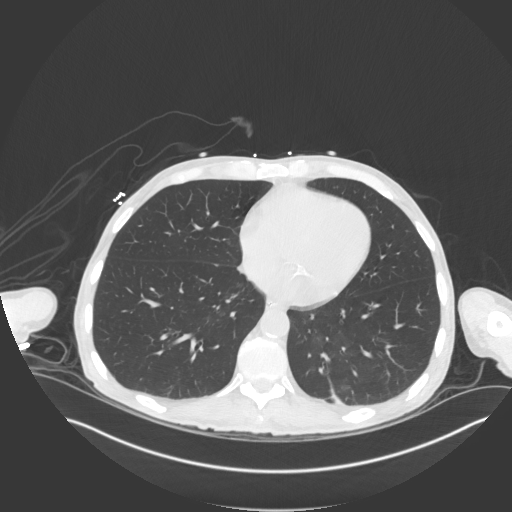
[im 73/163  lung]
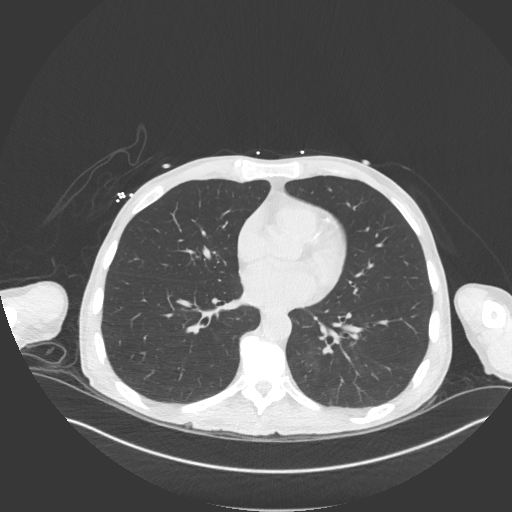
[im 91/163  lung]
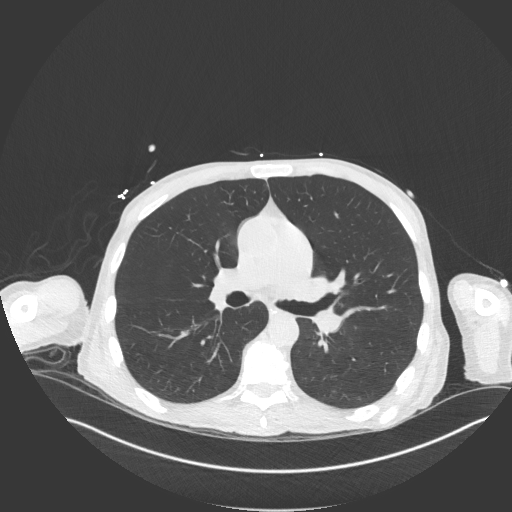
[im 103/163  lung]
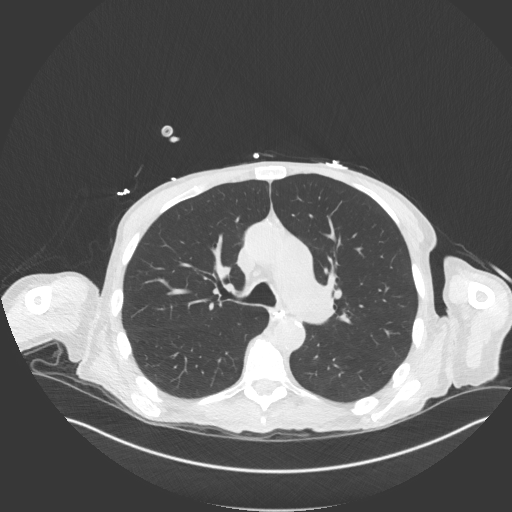
[im 115/163  mediastinal]
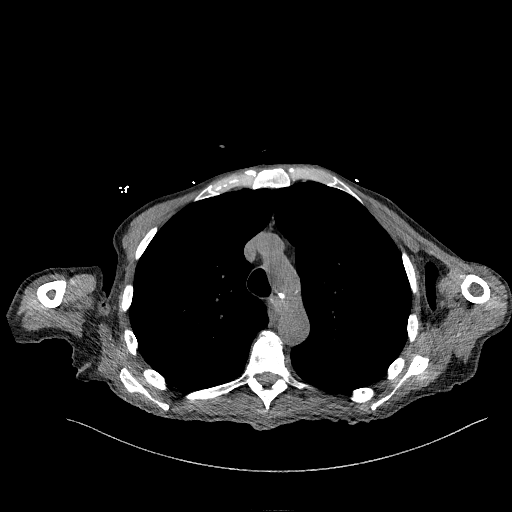
[im 115/163  lung]
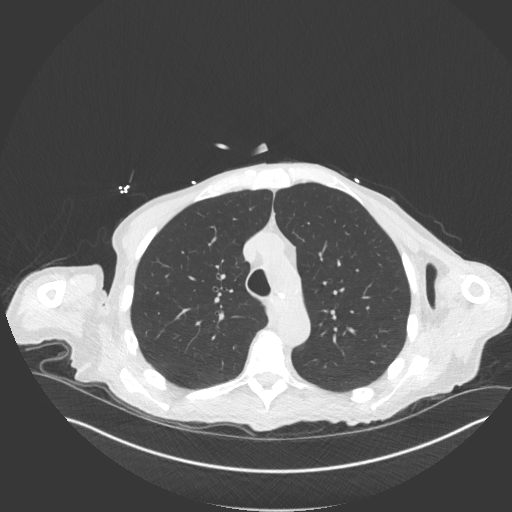
[im 127/163  lung]
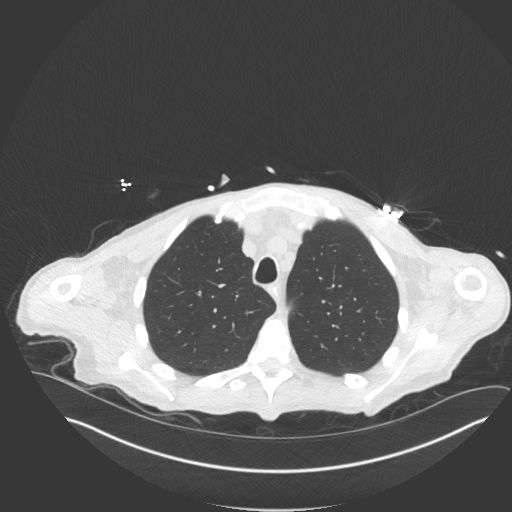
[im 139/163  lung]
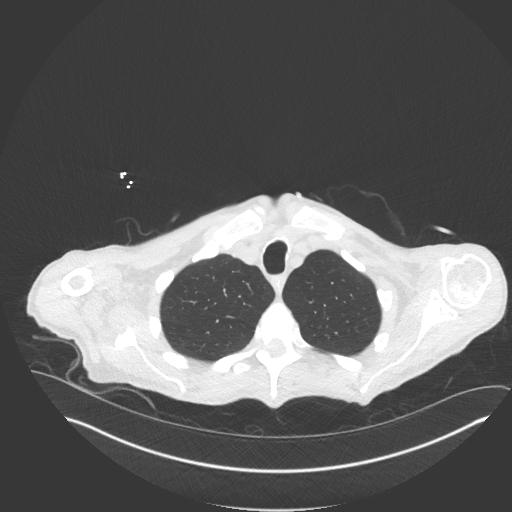
[im 151/163  lung]
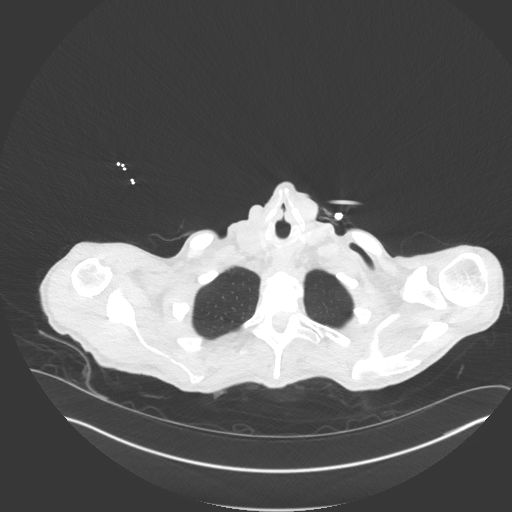

[Series 5: chest w/o 3mm st cor · coronal · non-contrast · 0.66mm/px · 3 of 101 slices shown]
[im 21/101  lung]
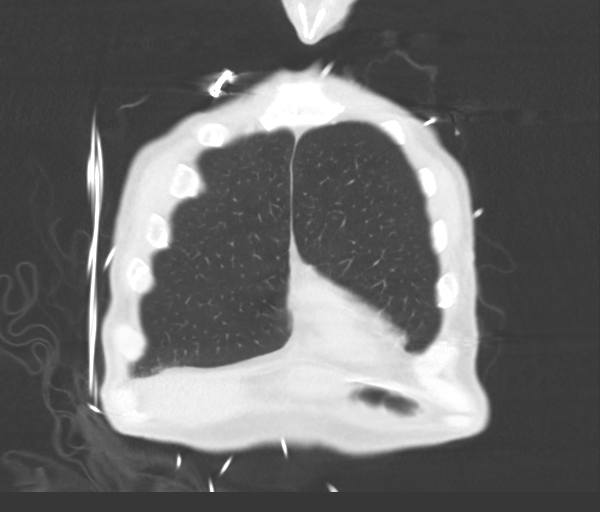
[im 41/101  lung]
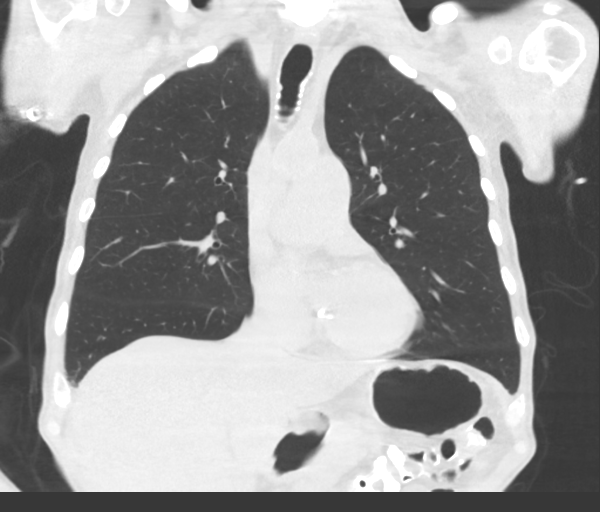
[im 61/101  lung]
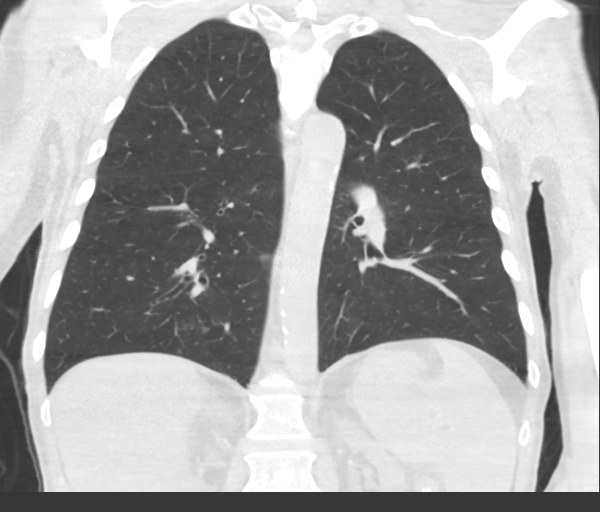

[15 of 36 positions shown; findings below may reference images not displayed]

FINDINGS: Cardiovascular: Normal heart size. Scattered coronary
calcifications. No pericardial effusion. Patchy calcifications in
the aortic arch in descending thoracic segment without aneurysm.

Mediastinum/Nodes: No enlarged mediastinal or axillary lymph nodes.
Thyroid gland, trachea, and esophagus demonstrate no significant
findings. Residual contrast in the mid esophagus from recent swallow
study.

Lungs/Pleura: Scattered clustered ground-glass opacities in the
superior segment left upper lobe, and in the medial basal segment
right lower lobe, up to 8 mm. Linear scarring or subsegmental
atelectasis posteriorly in the left lower lobe. No pneumothorax. No
effusion.

Upper Abdomen: No acute findings. Residual contrast in the gastric
lumen from recent swallowing study.

Musculoskeletal: Cervical fixation hardware partially visualized.
Anterior vertebral endplate spurring at multiple levels in the mid
thoracic spine. Negative for fracture or worrisome bone lesion.
IMPRESSION: 1. Scattered cluster ground-glass opacities in both lower lobes,
likely infectious/inflammatory. Non-contrast chest CT at 3-6 months
is recommended. If nodules persist, subsequent management will be
based upon the most suspicious nodule(s). This recommendation
follows the consensus statement: Guidelines for Management of
Incidental Pulmonary Nodules Detected on CT Images: From the
2. Coronary and Aortic Atherosclerosis ([id number]-170.0)
